# Patient Record
Sex: Male | Born: 1945 | Race: White | Hispanic: No | Marital: Married | Smoking: Former smoker
Health system: Southern US, Community
[De-identification: ages and names within clinical notes are randomized; demographics above are authoritative.]

## PROBLEM LIST (undated history)

## (undated) DIAGNOSIS — D126 Benign neoplasm of colon, unspecified: Secondary | ICD-10-CM

## (undated) DIAGNOSIS — G20A1 Parkinson's disease without dyskinesia, without mention of fluctuations: Secondary | ICD-10-CM

## (undated) DIAGNOSIS — I700 Atherosclerosis of aorta: Secondary | ICD-10-CM

## (undated) DIAGNOSIS — J381 Polyp of vocal cord and larynx: Secondary | ICD-10-CM

## (undated) DIAGNOSIS — N390 Urinary tract infection, site not specified: Secondary | ICD-10-CM

## (undated) DIAGNOSIS — T8859XA Other complications of anesthesia, initial encounter: Secondary | ICD-10-CM

## (undated) DIAGNOSIS — W19XXXA Unspecified fall, initial encounter: Secondary | ICD-10-CM

## (undated) DIAGNOSIS — R001 Bradycardia, unspecified: Secondary | ICD-10-CM

## (undated) DIAGNOSIS — K4090 Unilateral inguinal hernia, without obstruction or gangrene, not specified as recurrent: Secondary | ICD-10-CM

## (undated) DIAGNOSIS — Z87442 Personal history of urinary calculi: Secondary | ICD-10-CM

## (undated) DIAGNOSIS — I77812 Thoracoabdominal aortic ectasia: Secondary | ICD-10-CM

## (undated) DIAGNOSIS — R195 Other fecal abnormalities: Secondary | ICD-10-CM

## (undated) DIAGNOSIS — I509 Heart failure, unspecified: Secondary | ICD-10-CM

## (undated) DIAGNOSIS — A419 Sepsis, unspecified organism: Secondary | ICD-10-CM

## (undated) DIAGNOSIS — D649 Anemia, unspecified: Secondary | ICD-10-CM

## (undated) DIAGNOSIS — F0280 Dementia in other diseases classified elsewhere without behavioral disturbance: Secondary | ICD-10-CM

## (undated) DIAGNOSIS — I714 Abdominal aortic aneurysm, without rupture, unspecified: Secondary | ICD-10-CM

## (undated) DIAGNOSIS — K5900 Constipation, unspecified: Secondary | ICD-10-CM

## (undated) DIAGNOSIS — C801 Malignant (primary) neoplasm, unspecified: Secondary | ICD-10-CM

## (undated) DIAGNOSIS — K429 Umbilical hernia without obstruction or gangrene: Secondary | ICD-10-CM

## (undated) DIAGNOSIS — K219 Gastro-esophageal reflux disease without esophagitis: Secondary | ICD-10-CM

## (undated) DIAGNOSIS — I5020 Unspecified systolic (congestive) heart failure: Secondary | ICD-10-CM

## (undated) DIAGNOSIS — N200 Calculus of kidney: Secondary | ICD-10-CM

## (undated) DIAGNOSIS — F1021 Alcohol dependence, in remission: Secondary | ICD-10-CM

## (undated) DIAGNOSIS — Y92009 Unspecified place in unspecified non-institutional (private) residence as the place of occurrence of the external cause: Secondary | ICD-10-CM

## (undated) DIAGNOSIS — G2581 Restless legs syndrome: Secondary | ICD-10-CM

## (undated) DIAGNOSIS — C4442 Squamous cell carcinoma of skin of scalp and neck: Secondary | ICD-10-CM

## (undated) DIAGNOSIS — F32A Depression, unspecified: Secondary | ICD-10-CM

## (undated) DIAGNOSIS — N281 Cyst of kidney, acquired: Secondary | ICD-10-CM

## (undated) DIAGNOSIS — I2510 Atherosclerotic heart disease of native coronary artery without angina pectoris: Secondary | ICD-10-CM

## (undated) DIAGNOSIS — N210 Calculus in bladder: Secondary | ICD-10-CM

## (undated) DIAGNOSIS — G20 Parkinson's disease: Secondary | ICD-10-CM

## (undated) DIAGNOSIS — R32 Unspecified urinary incontinence: Secondary | ICD-10-CM

## (undated) DIAGNOSIS — C439 Malignant melanoma of skin, unspecified: Secondary | ICD-10-CM

## (undated) DIAGNOSIS — G479 Sleep disorder, unspecified: Secondary | ICD-10-CM

## (undated) DIAGNOSIS — N529 Male erectile dysfunction, unspecified: Secondary | ICD-10-CM

## (undated) DIAGNOSIS — K635 Polyp of colon: Secondary | ICD-10-CM

## (undated) DIAGNOSIS — N400 Enlarged prostate without lower urinary tract symptoms: Secondary | ICD-10-CM

## (undated) HISTORY — PX: CHOLECYSTECTOMY: SHX55

## (undated) HISTORY — DX: Calculus in bladder: N21.0

## (undated) HISTORY — DX: Benign prostatic hyperplasia without lower urinary tract symptoms: N40.0

## (undated) HISTORY — PX: OTHER SURGICAL HISTORY: SHX169

## (undated) HISTORY — PX: TONSILLECTOMY: SUR1361

## (undated) HISTORY — PX: LAPAROSCOPIC PARTIAL COLECTOMY: SHX5907

## (undated) HISTORY — PX: COLON SURGERY: SHX602

## (undated) HISTORY — PX: MELANOMA EXCISION: SHX5266

## (undated) HISTORY — DX: Umbilical hernia without obstruction or gangrene: K42.9

## (undated) HISTORY — DX: Unilateral inguinal hernia, without obstruction or gangrene, not specified as recurrent: K40.90

## (undated) HISTORY — DX: Atherosclerosis of aorta: I70.0

## (undated) HISTORY — DX: Squamous cell carcinoma of skin of scalp and neck: C44.42

## (undated) HISTORY — DX: Parkinson's disease without dyskinesia, without mention of fluctuations: G20.A1

## (undated) HISTORY — DX: Anemia, unspecified: D64.9

## (undated) HISTORY — DX: Depression, unspecified: F32.A

## (undated) HISTORY — PX: CYST EXCISION: SHX5701

## (undated) HISTORY — DX: Dementia in other diseases classified elsewhere, unspecified severity, without behavioral disturbance, psychotic disturbance, mood disturbance, and anxiety: F02.80

## (undated) HISTORY — DX: Sleep disorder, unspecified: G47.9

## (undated) HISTORY — DX: Atherosclerotic heart disease of native coronary artery without angina pectoris: I25.10

## (undated) HISTORY — DX: Thoracoabdominal aortic ectasia: I77.812

## (undated) HISTORY — DX: Calculus of kidney: N20.0

## (undated) HISTORY — DX: Heart failure, unspecified: I50.9

## (undated) HISTORY — DX: Bradycardia, unspecified: R00.1

---

## 2003-08-16 ENCOUNTER — Emergency Department (HOSPITAL_COMMUNITY): Admission: AD | Admit: 2003-08-16 | Discharge: 2003-08-16 | Payer: Self-pay | Admitting: Family Medicine

## 2011-09-28 DIAGNOSIS — G20 Parkinson's disease: Secondary | ICD-10-CM | POA: Diagnosis present

## 2012-05-15 DIAGNOSIS — Z85820 Personal history of malignant melanoma of skin: Secondary | ICD-10-CM

## 2013-02-22 DIAGNOSIS — R4189 Other symptoms and signs involving cognitive functions and awareness: Secondary | ICD-10-CM | POA: Diagnosis present

## 2013-07-31 DIAGNOSIS — F1021 Alcohol dependence, in remission: Secondary | ICD-10-CM | POA: Insufficient documentation

## 2013-10-24 DIAGNOSIS — Z9689 Presence of other specified functional implants: Secondary | ICD-10-CM

## 2013-10-24 HISTORY — DX: Presence of other specified functional implants: Z96.89

## 2013-10-24 HISTORY — PX: DEEP BRAIN STIMULATOR PLACEMENT: SHX608

## 2016-05-18 ENCOUNTER — Other Ambulatory Visit: Payer: Self-pay | Admitting: Internal Medicine

## 2016-05-18 DIAGNOSIS — Z136 Encounter for screening for cardiovascular disorders: Secondary | ICD-10-CM

## 2016-05-26 ENCOUNTER — Other Ambulatory Visit: Payer: Self-pay | Admitting: Internal Medicine

## 2016-05-27 DIAGNOSIS — R195 Other fecal abnormalities: Secondary | ICD-10-CM | POA: Diagnosis present

## 2016-05-30 ENCOUNTER — Other Ambulatory Visit: Payer: Self-pay | Admitting: Internal Medicine

## 2016-05-30 DIAGNOSIS — G20A1 Parkinson's disease without dyskinesia, without mention of fluctuations: Secondary | ICD-10-CM

## 2016-05-30 DIAGNOSIS — R1312 Dysphagia, oropharyngeal phase: Secondary | ICD-10-CM

## 2016-05-30 DIAGNOSIS — G20 Parkinson's disease: Secondary | ICD-10-CM

## 2016-05-31 ENCOUNTER — Ambulatory Visit
Admission: RE | Admit: 2016-05-31 | Discharge: 2016-05-31 | Disposition: A | Discharge status: Home / Self Care | Payer: Medicare Other | Source: Ambulatory Visit | Attending: Internal Medicine | Admitting: Internal Medicine

## 2016-05-31 DIAGNOSIS — N281 Cyst of kidney, acquired: Secondary | ICD-10-CM | POA: Insufficient documentation

## 2016-05-31 DIAGNOSIS — I77811 Abdominal aortic ectasia: Secondary | ICD-10-CM | POA: Insufficient documentation

## 2016-05-31 DIAGNOSIS — Z136 Encounter for screening for cardiovascular disorders: Secondary | ICD-10-CM | POA: Diagnosis not present

## 2016-06-16 ENCOUNTER — Ambulatory Visit: Payer: Medicare Other | Attending: Internal Medicine

## 2016-07-14 ENCOUNTER — Other Ambulatory Visit: Payer: Self-pay | Admitting: Gastroenterology

## 2016-07-14 DIAGNOSIS — R1314 Dysphagia, pharyngoesophageal phase: Secondary | ICD-10-CM

## 2016-07-19 ENCOUNTER — Ambulatory Visit: Admission: RE | Admit: 2016-07-19 | Payer: Medicare Other | Source: Ambulatory Visit

## 2016-09-27 ENCOUNTER — Ambulatory Visit
Admission: RE | Admit: 2016-09-27 | Discharge: 2016-09-27 | Disposition: A | Discharge status: Home / Self Care | Payer: Medicare HMO | Source: Ambulatory Visit | Attending: Physician Assistant | Admitting: Physician Assistant

## 2016-09-27 ENCOUNTER — Other Ambulatory Visit: Payer: Self-pay | Admitting: Physician Assistant

## 2016-09-27 DIAGNOSIS — M7989 Other specified soft tissue disorders: Secondary | ICD-10-CM

## 2016-11-07 ENCOUNTER — Ambulatory Visit: Payer: Medicare HMO | Admitting: Certified Registered Nurse Anesthetist

## 2016-11-07 ENCOUNTER — Ambulatory Visit
Admission: RE | Admit: 2016-11-07 | Discharge: 2016-11-07 | Disposition: A | Discharge status: Home / Self Care | Payer: Medicare HMO | Source: Ambulatory Visit | Attending: Gastroenterology | Admitting: Gastroenterology

## 2016-11-07 ENCOUNTER — Encounter
Admission: RE | Disposition: A | Discharge status: Home / Self Care | Payer: Self-pay | Source: Ambulatory Visit | Attending: Gastroenterology

## 2016-11-07 ENCOUNTER — Encounter: Payer: Self-pay | Admitting: *Deleted

## 2016-11-07 DIAGNOSIS — R1310 Dysphagia, unspecified: Secondary | ICD-10-CM | POA: Diagnosis not present

## 2016-11-07 DIAGNOSIS — Z85828 Personal history of other malignant neoplasm of skin: Secondary | ICD-10-CM | POA: Diagnosis not present

## 2016-11-07 DIAGNOSIS — Z79899 Other long term (current) drug therapy: Secondary | ICD-10-CM | POA: Insufficient documentation

## 2016-11-07 DIAGNOSIS — G20 Parkinson's disease: Secondary | ICD-10-CM | POA: Insufficient documentation

## 2016-11-07 DIAGNOSIS — D124 Benign neoplasm of descending colon: Secondary | ICD-10-CM | POA: Insufficient documentation

## 2016-11-07 DIAGNOSIS — D120 Benign neoplasm of cecum: Secondary | ICD-10-CM | POA: Diagnosis not present

## 2016-11-07 DIAGNOSIS — D123 Benign neoplasm of transverse colon: Secondary | ICD-10-CM | POA: Insufficient documentation

## 2016-11-07 DIAGNOSIS — K5730 Diverticulosis of large intestine without perforation or abscess without bleeding: Secondary | ICD-10-CM | POA: Diagnosis not present

## 2016-11-07 DIAGNOSIS — K921 Melena: Secondary | ICD-10-CM | POA: Diagnosis present

## 2016-11-07 DIAGNOSIS — D125 Benign neoplasm of sigmoid colon: Secondary | ICD-10-CM | POA: Insufficient documentation

## 2016-11-07 HISTORY — PX: COLONOSCOPY WITH PROPOFOL: SHX5780

## 2016-11-07 HISTORY — DX: Malignant (primary) neoplasm, unspecified: C80.1

## 2016-11-07 HISTORY — DX: Parkinson's disease without dyskinesia, without mention of fluctuations: G20.A1

## 2016-11-07 HISTORY — DX: Parkinson's disease: G20

## 2016-11-07 SURGERY — COLONOSCOPY WITH PROPOFOL
Anesthesia: General

## 2016-11-07 MED ORDER — SODIUM CHLORIDE 0.9 % IV SOLN: INTRAVENOUS | Status: DC

## 2016-11-07 MED ORDER — PROPOFOL 10 MG/ML IV BOLUS
INTRAVENOUS | Status: AC
  Filled 2016-11-07: qty 20

## 2016-11-07 MED ORDER — LIDOCAINE HCL (PF) 2 % IJ SOLN
INTRAMUSCULAR | Status: AC
  Filled 2016-11-07: qty 2

## 2016-11-07 MED ORDER — PROPOFOL 500 MG/50ML IV EMUL
INTRAVENOUS | Status: DC | PRN
  Administered 2016-11-07: 140 ug/kg/min via INTRAVENOUS

## 2016-11-07 MED ORDER — LIDOCAINE HCL (CARDIAC) 20 MG/ML IV SOLN
INTRAVENOUS | Status: DC | PRN
  Administered 2016-11-07: 80 mg via INTRAVENOUS

## 2016-11-07 MED ORDER — PROPOFOL 500 MG/50ML IV EMUL
INTRAVENOUS | Status: AC
  Filled 2016-11-07: qty 50

## 2016-11-07 MED ORDER — SODIUM CHLORIDE 0.9 % IV SOLN
INTRAVENOUS | Status: DC
  Administered 2016-11-07: 1000 mL via INTRAVENOUS

## 2016-11-07 MED ORDER — PROPOFOL 10 MG/ML IV BOLUS
INTRAVENOUS | Status: DC | PRN
  Administered 2016-11-07: 70 mg via INTRAVENOUS

## 2016-11-07 NOTE — Anesthesia Postprocedure Evaluation (Signed)
Anesthesia Post Note  Patient: Wesley Anderson.  Procedure(s) Performed: Procedure(s) (LRB): COLONOSCOPY WITH PROPOFOL (N/A)  Patient location during evaluation: Endoscopy Anesthesia Type: General Level of consciousness: awake and alert Pain management: pain level controlled Vital Signs Assessment: post-procedure vital signs reviewed and stable Respiratory status: spontaneous breathing, nonlabored ventilation, respiratory function stable and patient connected to nasal cannula oxygen Cardiovascular status: blood pressure returned to baseline and stable Postop Assessment: no signs of nausea or vomiting Anesthetic complications: no     Last Vitals:  Vitals:   11/07/16 1008 11/07/16 1018  BP: (!) 148/96 136/84  Pulse: (!) 54 (!) 45  Resp: 13 10  Temp:      Last Pain:  Vitals:   11/07/16 0938  TempSrc: Tympanic                 Martha Clan

## 2016-11-07 NOTE — Anesthesia Procedure Notes (Signed)
Performed by: Willie Plain Pre-anesthesia Checklist: Patient identified, Emergency Drugs available, Suction available, Patient being monitored and Timeout performed Patient Re-evaluated:Patient Re-evaluated prior to inductionOxygen Delivery Method: Nasal cannula Intubation Type: IV induction       

## 2016-11-07 NOTE — Anesthesia Preprocedure Evaluation (Signed)
Anesthesia Evaluation  Patient identified by MRN, date of birth, ID band Patient awake    Reviewed: Allergy & Precautions, H&P , NPO status , Patient's Chart, lab work & pertinent test results, reviewed documented beta blocker date and time   History of Anesthesia Complications Negative for: history of anesthetic complications  Airway Mallampati: I  TM Distance: >3 FB Neck ROM: full    Dental  (+) Caps, Missing   Pulmonary neg pulmonary ROS,           Cardiovascular Exercise Tolerance: Good negative cardio ROS       Neuro/Psych neg Seizures Parkinson's disease negative psych ROS   GI/Hepatic negative GI ROS, Neg liver ROS,   Endo/Other  negative endocrine ROS  Renal/GU negative Renal ROS  negative genitourinary   Musculoskeletal   Abdominal   Peds  Hematology negative hematology ROS (+)   Anesthesia Other Findings Past Medical History: No date: Cancer ([place])     Comment: skin  No date: Parkinson's disease ([place])   Reproductive/Obstetrics negative OB ROS                             Anesthesia Physical Anesthesia Plan  ASA: II  Anesthesia Plan: General   Post-op Pain Management:    Induction:   Airway Management Planned:   Additional Equipment:   Intra-op Plan:   Post-operative Plan:   Informed Consent: I have reviewed the patients History and Physical, chart, labs and discussed the procedure including the risks, benefits and alternatives for the proposed anesthesia with the patient or authorized representative who has indicated his/her understanding and acceptance.   Dental Advisory Given  Plan Discussed with: Anesthesiologist, CRNA and Surgeon  Anesthesia Plan Comments:         Anesthesia Quick Evaluation

## 2016-11-07 NOTE — Anesthesia Post-op Follow-up Note (Cosign Needed)
Anesthesia QCDR form completed.        

## 2016-11-07 NOTE — Op Note (Signed)
[facility] Gastroenterology Patient Name: Wesley Anderson Procedure Date: 11/07/2016 8:32 AM MRN: [id number] Account #: [id number] Date of Birth: 23-Oct-1945 Admit Type: Outpatient Age: 71 Room: [facility] ENDO ROOM 3 Gender: Male Note Status: Finalized Procedure:            Colonoscopy Indications:          Heme positive stool Providers:            Lollie Sails, MD Referring MD:         Ramonita Lab, MD (Referring MD) Medicines:            Monitored Anesthesia Care Complications:        No immediate complications. Procedure:            Pre-Anesthesia Assessment:                       - ASA Grade Assessment: III - A patient with severe                        systemic disease.                       After obtaining informed consent, the colonoscope was                        passed under direct vision. Throughout the procedure,                        the patient's blood pressure, pulse, and oxygen                        saturations were monitored continuously. The                        Colonoscope was introduced through the anus and                        advanced to the the cecum, identified by appendiceal                        orifice and ileocecal valve. The quality of the bowel                        preparation was good. Findings:      Two flat polyps were found in the descending colon. The polyps were 4 to       6 mm in size. These polyps were removed with a cold snare. Resection and       retrieval were complete.      Two flat polyps were found in the distal transverse colon. The polyps       were 4 to 6 mm in size.      A 8 mm polyp was found in the hepatic flexure. The polyp was flat. The       polyp was removed with a cold snare. Resection and retrieval were       complete.      Three sessile polyps were found in the cecum. The polyps were 1 to 3 mm       in size. These polyps were removed with a cold biopsy forceps. Resection       and retrieval were  complete.  A greater than 50 mm polyp was found in the cecum, across from the IC       valve. The polyp was carpet-like. Biopsies were taken with a cold       forceps for histology. There is also a polyp adjacent to this, about 5       mm, not removed.      Two pedunculated polyps were found in the proximal sigmoid colon. The       polyps were 5 to 10 mm in size. These polyps were removed with a cold       snare. Resection and retrieval were complete. To prevent bleeding after       the polypectomy, two hemostatic clips were successfully placed. There       was no bleeding at the end of the maneuver.      Multiple small and large-mouthed diverticula were found in the sigmoid       colon and descending colon.      The retroflexed view of the distal rectum and anal verge was normal and       showed no anal or rectal abnormalities.      The digital rectal exam was normal. Impression:           - Two 4 to 6 mm polyps in the descending colon, removed                        with a cold snare. Resected and retrieved.                       - Two 4 to 6 mm polyps in the distal transverse colon.                       - One 8 mm polyp at the hepatic flexure, removed with a                        cold snare. Resected and retrieved.                       - Three 1 to 3 mm polyps in the cecum, removed with a                        cold biopsy forceps. Resected and retrieved.                       - One greater than 50 mm polyp in the cecum. Biopsied.                       - Two 5 to 10 mm polyps in the proximal sigmoid colon,                        removed with a cold snare. Resected and retrieved.                        Clips were placed.                       - Diverticulosis in the sigmoid colon and in the  descending colon.                       - The distal rectum and anal verge are normal on                        retroflexion view. Recommendation:       - Discharge  patient to home.                       - Await pathology results.                       - If the pathology report reveals adenomatous tissue,                        then refer to a surgeon at appointment to be scheduled. Procedure Code(s):    --- Professional ---                       [phone], Colonoscopy, flexible; with removal of tumor(s),                        polyp(s), or other lesion(s) by snare technique                       [id number], 48, Colonoscopy, flexible; with biopsy, single                        or multiple Diagnosis Code(s):    --- Professional ---                       D12.4, Benign neoplasm of descending colon                       D12.3, Benign neoplasm of transverse colon (hepatic                        flexure or splenic flexure)                       D12.0, Benign neoplasm of cecum                       D12.5, Benign neoplasm of sigmoid colon                       R19.5, Other fecal abnormalities                       K57.30, Diverticulosis of large intestine without                        perforation or abscess without bleeding CPT copyright 2016 American Medical Association. All rights reserved. The codes documented in this report are preliminary and upon coder review may  be revised to meet current compliance requirements. Lollie Sails, MD 11/07/2016 9:40:42 AM This report has been signed electronically. Number of Addenda: 0 Note Initiated On: 11/07/2016 8:32 AM Scope Withdrawal Time: 0 hours 32 minutes 7 seconds  Total Procedure Duration: 0 hours 56 minutes 0 seconds       [facility]

## 2016-11-07 NOTE — H&P (Signed)
Outpatient short stay form Pre-procedure 11/07/2016 8:25 AM Lollie Sails MD  Primary Physician: Dr. Ramonita Lab  Reason for visit:  Colonoscopy  History of present illness:  Patient is a 71 year old male presenting today as above. He has a personal history of a finding of a Hemoccult-positive stool. He takes no blood thinning agents or aspirin products. Tolerated his prep well.    Current Facility-Administered Medications:  .  0.9 %  sodium chloride infusion, , Intravenous, Continuous, Lollie Sails, MD, Last Rate: 20 mL/hr at 11/07/16 0823, 1,000 mL at 11/07/16 0823 .  0.9 %  sodium chloride infusion, , Intravenous, Continuous, Lollie Sails, MD  Prescriptions Prior to Admission  Medication Sig Dispense Refill Last Dose  . amantadine (SYMMETREL) 100 MG capsule Take 100 mg by mouth 2 (two) times daily.     . Carbidopa-Levodopa ER (SINEMET CR) 25-100 MG tablet controlled release Take 1 tablet by mouth 2 (two) times daily.     . Multiple Vitamin (MULTIVITAMIN) tablet Take 1 tablet by mouth daily.     Marland Kitchen rOPINIRole (REQUIP XL) 8 MG 24 hr tablet Take 8 mg by mouth at bedtime.     . sildenafil (VIAGRA) 100 MG tablet Take 100 mg by mouth daily as needed for erectile dysfunction.     . urea (CARMOL) 40 % CREA Apply topically daily.        Allergies  Allergen Reactions  . Artane [Trihexyphenidyl] Anxiety     Past Medical History:  Diagnosis Date  . Cancer ([place])    skin   . Parkinson's disease ([place])     Review of systems:      Physical Exam    Heart and lungs: Regular rate and rhythm without rub or gallop, lungs are bilaterally clear.    HEENT: Normocephalic atraumatic eyes are anicteric    Other:     Pertinant exam for procedure: Soft nontender nondistended bowel sounds positive normoactive.    Planned proceedures: Colonoscopy and indicated procedures. I have discussed the risks benefits and complications of procedures to include not limited to bleeding,  infection, perforation and the risk of sedation and the patient wishes to proceed.    Lollie Sails, MD Gastroenterology 11/07/2016  8:25 AM

## 2016-11-07 NOTE — Transfer of Care (Signed)
Immediate Anesthesia Transfer of Care Note  Patient: Wesley Anderson.  Procedure(s) Performed: Procedure(s): COLONOSCOPY WITH PROPOFOL (N/A)  Patient Location: PACU  Anesthesia Type:General  Level of Consciousness: sedated  Airway & Oxygen Therapy: Patient Spontanous Breathing and Patient connected to nasal cannula oxygen  Post-op Assessment: Report given to RN and Post -op Vital signs reviewed and stable  Post vital signs: Reviewed and stable  Last Vitals:  Vitals:   11/07/16 0805  BP: (!) 153/90  Pulse: (!) 59  Resp: 16  Temp: 36.2 C    Last Pain:  Vitals:   11/07/16 0805  TempSrc: Tympanic         Complications: No apparent anesthesia complications

## 2016-11-08 ENCOUNTER — Encounter: Payer: Self-pay | Admitting: Gastroenterology

## 2016-11-08 LAB — SURGICAL PATHOLOGY

## 2016-11-16 ENCOUNTER — Encounter: Payer: Self-pay | Admitting: *Deleted

## 2016-11-17 ENCOUNTER — Ambulatory Visit: Payer: Self-pay | Admitting: General Surgery

## 2016-11-23 ENCOUNTER — Encounter: Payer: Self-pay | Admitting: *Deleted

## 2016-11-29 ENCOUNTER — Ambulatory Visit: Payer: Self-pay | Admitting: General Surgery

## 2018-05-08 ENCOUNTER — Other Ambulatory Visit: Payer: Self-pay | Admitting: Gastroenterology

## 2018-05-08 DIAGNOSIS — R1310 Dysphagia, unspecified: Secondary | ICD-10-CM

## 2018-05-17 ENCOUNTER — Ambulatory Visit: Payer: Self-pay

## 2018-05-23 ENCOUNTER — Ambulatory Visit
Admission: RE | Admit: 2018-05-23 | Discharge: 2018-05-23 | Disposition: A | Discharge status: Home / Self Care | Payer: Medicare HMO | Source: Ambulatory Visit | Attending: Gastroenterology | Admitting: Gastroenterology

## 2018-05-23 DIAGNOSIS — K228 Other specified diseases of esophagus: Secondary | ICD-10-CM | POA: Insufficient documentation

## 2018-05-23 DIAGNOSIS — K219 Gastro-esophageal reflux disease without esophagitis: Secondary | ICD-10-CM | POA: Insufficient documentation

## 2018-05-23 DIAGNOSIS — R1310 Dysphagia, unspecified: Secondary | ICD-10-CM | POA: Insufficient documentation

## 2018-07-05 ENCOUNTER — Encounter: Admission: RE | Payer: Self-pay | Source: Ambulatory Visit

## 2018-07-05 ENCOUNTER — Ambulatory Visit: Admission: RE | Admit: 2018-07-05 | Payer: Medicare HMO | Source: Ambulatory Visit | Admitting: Gastroenterology

## 2018-07-05 SURGERY — EGD (ESOPHAGOGASTRODUODENOSCOPY)
Anesthesia: General

## 2018-09-05 ENCOUNTER — Encounter: Payer: Self-pay | Admitting: *Deleted

## 2018-09-06 ENCOUNTER — Encounter
Admission: RE | Disposition: A | Discharge status: Home / Self Care | Payer: Self-pay | Source: Home / Self Care | Attending: Gastroenterology

## 2018-09-06 ENCOUNTER — Other Ambulatory Visit: Payer: Self-pay

## 2018-09-06 ENCOUNTER — Ambulatory Visit
Admission: RE | Admit: 2018-09-06 | Discharge: 2018-09-06 | Disposition: A | Discharge status: Home / Self Care | Payer: Medicare HMO | Attending: Gastroenterology | Admitting: Gastroenterology

## 2018-09-06 ENCOUNTER — Encounter: Payer: Self-pay | Admitting: *Deleted

## 2018-09-06 ENCOUNTER — Ambulatory Visit: Payer: Medicare HMO | Admitting: Anesthesiology

## 2018-09-06 DIAGNOSIS — K224 Dyskinesia of esophagus: Secondary | ICD-10-CM | POA: Insufficient documentation

## 2018-09-06 DIAGNOSIS — K2980 Duodenitis without bleeding: Secondary | ICD-10-CM | POA: Diagnosis not present

## 2018-09-06 DIAGNOSIS — Z85820 Personal history of malignant melanoma of skin: Secondary | ICD-10-CM | POA: Insufficient documentation

## 2018-09-06 DIAGNOSIS — I714 Abdominal aortic aneurysm, without rupture: Secondary | ICD-10-CM | POA: Diagnosis not present

## 2018-09-06 DIAGNOSIS — N529 Male erectile dysfunction, unspecified: Secondary | ICD-10-CM | POA: Diagnosis not present

## 2018-09-06 DIAGNOSIS — Z86010 Personal history of colonic polyps: Secondary | ICD-10-CM | POA: Diagnosis not present

## 2018-09-06 DIAGNOSIS — K3189 Other diseases of stomach and duodenum: Secondary | ICD-10-CM | POA: Diagnosis not present

## 2018-09-06 DIAGNOSIS — G2581 Restless legs syndrome: Secondary | ICD-10-CM | POA: Diagnosis not present

## 2018-09-06 DIAGNOSIS — K210 Gastro-esophageal reflux disease with esophagitis: Secondary | ICD-10-CM | POA: Diagnosis not present

## 2018-09-06 DIAGNOSIS — Z87891 Personal history of nicotine dependence: Secondary | ICD-10-CM | POA: Diagnosis not present

## 2018-09-06 DIAGNOSIS — K2960 Other gastritis without bleeding: Secondary | ICD-10-CM | POA: Diagnosis not present

## 2018-09-06 DIAGNOSIS — K2950 Unspecified chronic gastritis without bleeding: Secondary | ICD-10-CM | POA: Insufficient documentation

## 2018-09-06 DIAGNOSIS — Z9049 Acquired absence of other specified parts of digestive tract: Secondary | ICD-10-CM | POA: Insufficient documentation

## 2018-09-06 DIAGNOSIS — Z79899 Other long term (current) drug therapy: Secondary | ICD-10-CM | POA: Diagnosis not present

## 2018-09-06 DIAGNOSIS — Z09 Encounter for follow-up examination after completed treatment for conditions other than malignant neoplasm: Secondary | ICD-10-CM | POA: Insufficient documentation

## 2018-09-06 DIAGNOSIS — G20 Parkinson's disease: Secondary | ICD-10-CM | POA: Insufficient documentation

## 2018-09-06 HISTORY — DX: Abdominal aortic aneurysm, without rupture, unspecified: I71.40

## 2018-09-06 HISTORY — DX: Other fecal abnormalities: R19.5

## 2018-09-06 HISTORY — DX: Cyst of kidney, acquired: N28.1

## 2018-09-06 HISTORY — PX: COLONOSCOPY WITH PROPOFOL: SHX5780

## 2018-09-06 HISTORY — DX: Polyp of colon: K63.5

## 2018-09-06 HISTORY — DX: Restless legs syndrome: G25.81

## 2018-09-06 HISTORY — DX: Male erectile dysfunction, unspecified: N52.9

## 2018-09-06 HISTORY — DX: Alcohol dependence, in remission: F10.21

## 2018-09-06 HISTORY — DX: Abdominal aortic aneurysm, without rupture: I71.4

## 2018-09-06 HISTORY — PX: ESOPHAGOGASTRODUODENOSCOPY (EGD) WITH PROPOFOL: SHX5813

## 2018-09-06 HISTORY — DX: Malignant melanoma of skin, unspecified: C43.9

## 2018-09-06 SURGERY — COLONOSCOPY WITH PROPOFOL
Anesthesia: General

## 2018-09-06 MED ORDER — GLYCOPYRROLATE 0.2 MG/ML IJ SOLN
INTRAMUSCULAR | Status: AC
  Filled 2018-09-06: qty 1

## 2018-09-06 MED ORDER — PROPOFOL 500 MG/50ML IV EMUL
INTRAVENOUS | Status: AC
  Filled 2018-09-06: qty 50

## 2018-09-06 MED ORDER — PROPOFOL 10 MG/ML IV BOLUS
INTRAVENOUS | Status: DC | PRN
  Administered 2018-09-06: 20 mg via INTRAVENOUS
  Administered 2018-09-06: 80 mg via INTRAVENOUS

## 2018-09-06 MED ORDER — LIDOCAINE HCL (PF) 2 % IJ SOLN
INTRAMUSCULAR | Status: AC
  Filled 2018-09-06: qty 10

## 2018-09-06 MED ORDER — FENTANYL CITRATE (PF) 100 MCG/2ML IJ SOLN
INTRAMUSCULAR | Status: DC | PRN
  Administered 2018-09-06 (×2): 50 ug via INTRAVENOUS

## 2018-09-06 MED ORDER — PROPOFOL 500 MG/50ML IV EMUL
INTRAVENOUS | Status: DC | PRN
  Administered 2018-09-06: 140 ug/kg/min via INTRAVENOUS

## 2018-09-06 MED ORDER — SODIUM CHLORIDE 0.9 % IV SOLN
INTRAVENOUS | Status: DC | PRN
  Administered 2018-09-06: 09:00:00 via INTRAVENOUS

## 2018-09-06 MED ORDER — LIDOCAINE 2% (20 MG/ML) 5 ML SYRINGE
INTRAMUSCULAR | Status: DC | PRN
  Administered 2018-09-06: 30 mg via INTRAVENOUS

## 2018-09-06 MED ORDER — FENTANYL CITRATE (PF) 100 MCG/2ML IJ SOLN
INTRAMUSCULAR | Status: AC
  Filled 2018-09-06: qty 2

## 2018-09-06 MED ORDER — PROPOFOL 10 MG/ML IV BOLUS
INTRAVENOUS | Status: AC
  Filled 2018-09-06: qty 20

## 2018-09-06 MED ORDER — EPHEDRINE SULFATE 50 MG/ML IJ SOLN
INTRAMUSCULAR | Status: DC | PRN
  Administered 2018-09-06: 10 mg via INTRAVENOUS

## 2018-09-06 MED ORDER — SODIUM CHLORIDE 0.9 % IV SOLN
INTRAVENOUS | Status: DC
  Administered 2018-09-06: 08:00:00 via INTRAVENOUS

## 2018-09-06 NOTE — Op Note (Signed)
[facility] Gastroenterology Patient Name: Wesley Anderson Procedure Date: 09/06/2018 7:37 AM MRN: [id number] Account #: [id number] Date of Birth: Jul 16, 1946 Admit Type: Outpatient Age: 73 Room: [facility] ENDO ROOM 1 Gender: Male Note Status: Finalized Procedure:            Upper GI endoscopy Indications:          Dysphagia, Nausea with vomiting Providers:            Lollie Sails, MD Referring MD:         Ramonita Lab, MD (Referring MD) Medicines:            Monitored Anesthesia Care Complications:        No immediate complications. Procedure:            Pre-Anesthesia Assessment:                       - ASA Grade Assessment: III - A patient with severe                        systemic disease.                       After obtaining informed consent, the endoscope was                        passed under direct vision. Throughout the procedure,                        the patient's blood pressure, pulse, and oxygen                        saturations were monitored continuously. The Endoscope                        was introduced through the mouth, and advanced to the                        third part of duodenum. The patient tolerated the                        procedure well. Findings:      The Z-line was variable. Biopsies were taken with a cold forceps for       histology.      LA Grade B (one or more mucosal breaks greater than 5 mm, not extending       between the tops of two mucosal folds) esophagitis with no bleeding was       found.      Abnormal motility was noted in the mid esophagus and in the distal       esophagus. The cricopharyngeus was normal. There are extra peristaltic       waves in the esophageal body. The distal esophagus/lower esophageal       sphincter is open. Tertiary peristaltic waves are noted.      Patchy moderate inflammation characterized by adherent blood, congestion       (edema), erosions, erythema and shallow ulcerations was found in  the       gastric antrum. Biopsies were taken with a cold forceps for histology.       Biopsies were taken with a cold forceps for Helicobacter pylori testing.  The cardia and gastric fundus were normal on retroflexion otherwise.      Diffuse mild mucosal variance characterized by smoothness was found in       the entire duodenum. Biopsies were taken with a cold forceps for       histology.      A single 10 mm mucosal papule (nodule) with no bleeding and no stigmata       of recent bleeding was found in the prepyloric region of the stomach.       Biopsies were taken with a cold forceps for histology.      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 Biopsied.                       - LA Grade B esophagitis.                       - Abnormal esophageal motility.                       - Erosive gastritis. Biopsied.                       - Mucosal variant in the duodenum. Biopsied.                       - A single mucosal papule (nodule) found in the                        stomach. Biopsied. Recommendation:       - Use Protonix (pantoprazole) 40 mg PO daily daily.                       - Return to GI clinic in 4 weeks. Procedure Code(s):    --- Professional ---                       [phone], Esophagogastroduodenoscopy, flexible, transoral;                        with biopsy, single or multiple Diagnosis Code(s):    --- Professional ---                       K22.8, Other specified diseases of esophagus                       K20.9, Esophagitis, unspecified                       K22.4, Dyskinesia of esophagus                       K29.60, Other gastritis without bleeding                       K31.89, Other diseases of stomach and duodenum                       R13.10, Dysphagia, unspecified                       R11.2, Nausea with vomiting,  unspecified CPT copyright 2018 American Medical Association. All rights  reserved. The codes documented in this report are preliminary and upon coder review may  be revised to meet current compliance requirements. Lollie Sails, MD 09/06/2018 9:01:24 AM This report has been signed electronically. Number of Addenda: 0 Note Initiated On: 09/06/2018 7:37 AM      [facility]

## 2018-09-06 NOTE — Anesthesia Preprocedure Evaluation (Addendum)
Anesthesia Evaluation  Patient identified by MRN, date of birth, ID band Patient awake    Reviewed: Allergy & Precautions, H&P , NPO status , Patient's Chart, lab work & pertinent test results  Airway Mallampati: II      Comment: beard Dental  (+) Teeth Intact   Pulmonary neg pulmonary ROS, neg shortness of breath, neg COPD, neg recent URI, former smoker,           Cardiovascular (-) angina(-) Past MI negative cardio ROS       Neuro/Psych PSYCHIATRIC DISORDERS negative neurological ROS     GI/Hepatic negative GI ROS, Neg liver ROS,   Endo/Other  negative endocrine ROS  Renal/GU Renal disease (renal cyst)  negative genitourinary   Musculoskeletal   Abdominal   Peds  Hematology negative hematology ROS (+)   Anesthesia Other Findings Past Medical History: No date: AAA (abdominal aortic aneurysm) ([facility]) No date: Alcohol dependence in remission ([place]) No date: Cancer ([place])     Comment:  skin  No date: Colon polyps No date: ED (erectile dysfunction) No date: Hematest positive stools No date: Melanoma of skin ([place]) No date: Parkinson's disease ([place]) No date: Renal cyst, right No date: Restless leg syndrome  Past Surgical History: No date: CHOLECYSTECTOMY No date: COLON SURGERY     Comment:  colectomy with anastamosis 11/07/2016: COLONOSCOPY WITH PROPOFOL; N/A     Comment:  Procedure: COLONOSCOPY WITH PROPOFOL;  Surgeon: Lollie Sails, MD;  Location: [facility] ENDOSCOPY;  Service:               Endoscopy;  Laterality: N/A; No date: CYST EXCISION     Comment:  vocal cord No date: DBS placement No date: deep brain implant No date: MELANOMA EXCISION No date: TONSILLECTOMY     Reproductive/Obstetrics negative OB ROS                            Anesthesia Physical Anesthesia Plan  ASA: II  Anesthesia Plan: General   Post-op Pain Management:    Induction:   PONV Risk  Score and Plan: Propofol infusion and TIVA  Airway Management Planned:   Additional Equipment:   Intra-op Plan:   Post-operative Plan:   Informed Consent: I have reviewed the patients History and Physical, chart, labs and discussed the procedure including the risks, benefits and alternatives for the proposed anesthesia with the patient or authorized representative who has indicated his/her understanding and acceptance.     Dental Advisory Given  Plan Discussed with: Anesthesiologist and CRNA  Anesthesia Plan Comments:         Anesthesia Quick Evaluation

## 2018-09-06 NOTE — Op Note (Signed)
[facility] Gastroenterology Patient Name: Wesley Anderson Procedure Date: 09/06/2018 7:36 AM MRN: [id number] Account #: [id number] Date of Birth: 11-Jan-1946 Admit Type: Outpatient Age: 73 Room: [facility] ENDO ROOM 1 Gender: Male Note Status: Finalized Procedure:            Colonoscopy Indications:          Personal history of colonic polyps Providers:            Lollie Sails, MD Medicines:            Monitored Anesthesia Care Complications:        No immediate complications. Procedure:            Pre-Anesthesia Assessment:                       - ASA Grade Assessment: III - A patient with severe                        systemic disease.                       After obtaining informed consent, the colonoscope was                        passed under direct vision. Throughout the procedure,                        the patient's blood pressure, pulse, and oxygen                        saturations were monitored continuously. The                        Colonoscope was introduced through the anus with the                        intention of advancing to the surgical stoma. The scope                        was advanced to the sigmoid colon before the procedure                        was aborted. Medications were given. The colonoscopy                        was extremely difficult due to poor bowel prep with                        stool present. The patient tolerated the procedure                        well. The quality of the bowel preparation was                        inadequate. Findings:      A large amount of semi-liquid stool was found in the rectum and in the       sigmoid colon, precluding visualization.      The digital rectal exam was normal. Impression:           - Stool in the rectum and in the sigmoid colon.                       -  No specimens collected. Recommendation:       - reprep and reschedule.                       - Discharge patient to  home. Procedure Code(s):    --- Professional ---                       [phone], 47, Colonoscopy, flexible; diagnostic, including                        collection of specimen(s) by brushing or washing, when                        performed (separate procedure) Diagnosis Code(s):    --- Professional ---                       Z86.010, Personal history of colonic polyps CPT copyright 2018 American Medical Association. All rights reserved. The codes documented in this report are preliminary and upon coder review may  be revised to meet current compliance requirements. Lollie Sails, MD 09/06/2018 9:11:48 AM This report has been signed electronically. Number of Addenda: 0 Note Initiated On: 09/06/2018 7:36 AM Total Procedure Duration: 0 hours 3 minutes 24 seconds       [facility]

## 2018-09-06 NOTE — Anesthesia Post-op Follow-up Note (Signed)
Anesthesia QCDR form completed.        

## 2018-09-06 NOTE — Transfer of Care (Signed)
Immediate Anesthesia Transfer of Care Note  Patient: Wesley Anderson.  Procedure(s) Performed: COLONOSCOPY WITH PROPOFOL (N/A ) ESOPHAGOGASTRODUODENOSCOPY (EGD) WITH PROPOFOL (N/A )  Patient Location: PACU and Endoscopy Unit  Anesthesia Type:General  Level of Consciousness: sedated  Airway & Oxygen Therapy: Patient Spontanous Breathing and Patient connected to nasal cannula oxygen  Post-op Assessment: Report given to RN and Post -op Vital signs reviewed and stable  Post vital signs: Reviewed and stable  Last Vitals:  Vitals Value Taken Time  BP    Temp    Pulse    Resp    SpO2      Last Pain:  Vitals:   09/06/18 0758  TempSrc: Tympanic  PainSc: 0-No pain         Complications: No apparent anesthesia complications

## 2018-09-06 NOTE — H&P (Signed)
Outpatient short stay form Pre-procedure 09/06/2018 8:12 AM Lollie Sails MD  Primary Physician: Dr. Ramonita Lab  Reason for visit: EGD and colonoscopy  History of present illness: Patient is a 73 year old male presenting today for an EGD and colonoscopy.  He has a personal history of multiple adenomatous colon polyps as well as a large colon polyp that was removed by a laparoscopic right hemicolectomy in 2018.  Presenting today for follow-up in regards to this history.  Also he has had no episodes perhaps several times a year of nausea vomiting and diarrhea.  He is felt better from this for a couple of months.  However he does have a history of some dysphagia and had a barium swallow on 05/23/2018.  This showed some mild tertiary contractions intermittently in the distal third of the esophagus probably reflecting mild spasm versus presbyesophagus.  However he also has a moderate gastroesophageal reflux to the level of the cervical esophagus without evidence of esophageal stricture mass or ulceration.  It is of note he has a history of Parkinson's.  Does have an implanted stimulator.  Takes no aspirin or blood thinning agent.   No current facility-administered medications for this encounter.   Medications Prior to Admission  Medication Sig Dispense Refill Last Dose  . amantadine (SYMMETREL) 100 MG capsule Take 100 mg by mouth 2 (two) times daily.   09/05/2018 at 1600  . Carbidopa-Levodopa ER (SINEMET CR) 25-100 MG tablet controlled release Take 1 tablet by mouth 2 (two) times daily.   09/05/2018 at 1600  . Multiple Vitamin (MULTIVITAMIN) tablet Take 1 tablet by mouth daily.   Past Week at Unknown time  . rOPINIRole (REQUIP XL) 8 MG 24 hr tablet Take 8 mg by mouth at bedtime.   09/05/2018 at 1200  . sildenafil (VIAGRA) 100 MG tablet Take 100 mg by mouth daily as needed for erectile dysfunction.   Past Week at Unknown time  . urea (CARMOL) 40 % CREA Apply topically daily.   Past Month at Unknown time      Allergies  Allergen Reactions  . Artane [Trihexyphenidyl] Anxiety     Past Medical History:  Diagnosis Date  . AAA (abdominal aortic aneurysm) ([place])   . Alcohol dependence in remission ([place])   . Cancer ([place])    skin   . Colon polyps   . ED (erectile dysfunction)   . Hematest positive stools   . Melanoma of skin ([place])   . Parkinson's disease ([place])   . Renal cyst, right   . Restless leg syndrome     Review of systems:      Physical Exam    Heart and lungs: Regular rate and rhythm without rub or gallop, lungs are bilaterally clear.    HEENT: Normocephalic atraumatic eyes are anicteric    Other:    Pertinant exam for procedure: Soft nontender nondistended bowel sounds positive normoactive    Planned proceedures: GD, colonoscopy and indicated procedures. I have discussed the risks benefits and complications of procedures to include not limited to bleeding, infection, perforation and the risk of sedation and the patient wishes to proceed.    Lollie Sails, MD Gastroenterology 09/06/2018  8:12 AM

## 2018-09-06 NOTE — Anesthesia Postprocedure Evaluation (Signed)
Anesthesia Post Note  Patient: Daquann Merriott.  Procedure(s) Performed: COLONOSCOPY WITH PROPOFOL (N/A ) ESOPHAGOGASTRODUODENOSCOPY (EGD) WITH PROPOFOL (N/A )  Patient location during evaluation: PACU Anesthesia Type: General Level of consciousness: awake and alert Pain management: pain level controlled Vital Signs Assessment: post-procedure vital signs reviewed and stable Respiratory status: spontaneous breathing, nonlabored ventilation, respiratory function stable and patient connected to nasal cannula oxygen Cardiovascular status: blood pressure returned to baseline and stable Postop Assessment: no apparent nausea or vomiting Anesthetic complications: no     Last Vitals:  Vitals:   09/06/18 0934 09/06/18 0944  BP: (!) 160/82 (!) 120/105  Pulse: (!) 58 (!) 50  Resp: 17 14  Temp:    SpO2: 100% 100%    Last Pain:  Vitals:   09/06/18 0944  TempSrc:   PainSc: 0-No pain                 Durenda Hurt

## 2018-09-07 ENCOUNTER — Encounter: Payer: Self-pay | Admitting: Gastroenterology

## 2018-09-11 LAB — SURGICAL PATHOLOGY

## 2018-11-20 ENCOUNTER — Ambulatory Visit: Admission: RE | Admit: 2018-11-20 | Payer: Medicare HMO | Source: Home / Self Care | Admitting: Gastroenterology

## 2018-11-20 ENCOUNTER — Encounter: Admission: RE | Payer: Self-pay | Source: Home / Self Care

## 2018-11-20 SURGERY — COLONOSCOPY WITH PROPOFOL
Anesthesia: General

## 2019-05-13 ENCOUNTER — Other Ambulatory Visit: Payer: Self-pay

## 2019-05-13 ENCOUNTER — Other Ambulatory Visit
Admission: RE | Admit: 2019-05-13 | Discharge: 2019-05-13 | Disposition: A | Discharge status: Home / Self Care | Payer: Medicare HMO | Source: Ambulatory Visit | Attending: Gastroenterology | Admitting: Gastroenterology

## 2019-05-13 DIAGNOSIS — Z20828 Contact with and (suspected) exposure to other viral communicable diseases: Secondary | ICD-10-CM | POA: Insufficient documentation

## 2019-05-13 DIAGNOSIS — Z01812 Encounter for preprocedural laboratory examination: Secondary | ICD-10-CM | POA: Insufficient documentation

## 2019-05-13 LAB — SARS CORONAVIRUS 2 (TAT 6-24 HRS): SARS Coronavirus 2: NEGATIVE

## 2019-05-15 ENCOUNTER — Encounter: Payer: Self-pay | Admitting: *Deleted

## 2019-05-16 ENCOUNTER — Ambulatory Visit: Payer: Medicare HMO | Admitting: Anesthesiology

## 2019-05-16 ENCOUNTER — Encounter
Admission: RE | Disposition: A | Discharge status: Home / Self Care | Payer: Self-pay | Source: Home / Self Care | Attending: Gastroenterology

## 2019-05-16 ENCOUNTER — Ambulatory Visit
Admission: RE | Admit: 2019-05-16 | Discharge: 2019-05-16 | Disposition: A | Discharge status: Home / Self Care | Payer: Medicare HMO | Attending: Gastroenterology | Admitting: Gastroenterology

## 2019-05-16 ENCOUNTER — Other Ambulatory Visit: Payer: Self-pay

## 2019-05-16 DIAGNOSIS — Z85828 Personal history of other malignant neoplasm of skin: Secondary | ICD-10-CM | POA: Insufficient documentation

## 2019-05-16 DIAGNOSIS — K5730 Diverticulosis of large intestine without perforation or abscess without bleeding: Secondary | ICD-10-CM | POA: Diagnosis not present

## 2019-05-16 DIAGNOSIS — N529 Male erectile dysfunction, unspecified: Secondary | ICD-10-CM | POA: Diagnosis not present

## 2019-05-16 DIAGNOSIS — K219 Gastro-esophageal reflux disease without esophagitis: Secondary | ICD-10-CM | POA: Insufficient documentation

## 2019-05-16 DIAGNOSIS — G20 Parkinson's disease: Secondary | ICD-10-CM | POA: Insufficient documentation

## 2019-05-16 DIAGNOSIS — K640 First degree hemorrhoids: Secondary | ICD-10-CM | POA: Diagnosis not present

## 2019-05-16 DIAGNOSIS — D123 Benign neoplasm of transverse colon: Secondary | ICD-10-CM | POA: Diagnosis not present

## 2019-05-16 DIAGNOSIS — Z79899 Other long term (current) drug therapy: Secondary | ICD-10-CM | POA: Insufficient documentation

## 2019-05-16 DIAGNOSIS — Z9049 Acquired absence of other specified parts of digestive tract: Secondary | ICD-10-CM | POA: Insufficient documentation

## 2019-05-16 DIAGNOSIS — N400 Enlarged prostate without lower urinary tract symptoms: Secondary | ICD-10-CM | POA: Diagnosis not present

## 2019-05-16 DIAGNOSIS — Z86010 Personal history of colonic polyps: Secondary | ICD-10-CM | POA: Diagnosis not present

## 2019-05-16 DIAGNOSIS — D125 Benign neoplasm of sigmoid colon: Secondary | ICD-10-CM | POA: Insufficient documentation

## 2019-05-16 HISTORY — PX: COLONOSCOPY WITH PROPOFOL: SHX5780

## 2019-05-16 HISTORY — DX: Gastro-esophageal reflux disease without esophagitis: K21.9

## 2019-05-16 SURGERY — COLONOSCOPY WITH PROPOFOL
Anesthesia: General

## 2019-05-16 MED ORDER — PROPOFOL 500 MG/50ML IV EMUL
INTRAVENOUS | Status: AC
  Filled 2019-05-16: qty 50

## 2019-05-16 MED ORDER — MIDAZOLAM HCL 2 MG/2ML IJ SOLN
INTRAMUSCULAR | Status: AC
  Filled 2019-05-16: qty 2

## 2019-05-16 MED ORDER — PHENYLEPHRINE HCL (PRESSORS) 10 MG/ML IV SOLN
INTRAVENOUS | Status: DC | PRN
  Administered 2019-05-16 (×2): 100 ug via INTRAVENOUS

## 2019-05-16 MED ORDER — PROPOFOL 10 MG/ML IV BOLUS
INTRAVENOUS | Status: DC | PRN
  Administered 2019-05-16: 60 mg via INTRAVENOUS

## 2019-05-16 MED ORDER — FENTANYL CITRATE (PF) 100 MCG/2ML IJ SOLN
INTRAMUSCULAR | Status: AC
  Filled 2019-05-16: qty 2

## 2019-05-16 MED ORDER — SODIUM CHLORIDE 0.9 % IV SOLN
INTRAVENOUS | Status: DC
  Administered 2019-05-16: 07:00:00 via INTRAVENOUS

## 2019-05-16 MED ORDER — FENTANYL CITRATE (PF) 100 MCG/2ML IJ SOLN
INTRAMUSCULAR | Status: DC | PRN
  Administered 2019-05-16: 25 ug via INTRAVENOUS
  Administered 2019-05-16: 50 ug via INTRAVENOUS
  Administered 2019-05-16: 25 ug via INTRAVENOUS

## 2019-05-16 MED ORDER — PROPOFOL 500 MG/50ML IV EMUL
INTRAVENOUS | Status: DC | PRN
  Administered 2019-05-16: 100 ug/kg/min via INTRAVENOUS

## 2019-05-16 MED ORDER — MIDAZOLAM HCL 2 MG/2ML IJ SOLN
INTRAMUSCULAR | Status: DC | PRN
  Administered 2019-05-16: 1 mg via INTRAVENOUS
  Administered 2019-05-16 (×2): 0.5 mg via INTRAVENOUS

## 2019-05-16 NOTE — Anesthesia Postprocedure Evaluation (Signed)
Anesthesia Post Note  Patient: Wesley Anderson.  Procedure(s) Performed: COLONOSCOPY WITH PROPOFOL (N/A )  Patient location during evaluation: PACU Anesthesia Type: General Level of consciousness: awake and alert Pain management: pain level controlled Vital Signs Assessment: post-procedure vital signs reviewed and stable Respiratory status: spontaneous breathing, nonlabored ventilation and respiratory function stable Cardiovascular status: blood pressure returned to baseline and stable Postop Assessment: no apparent nausea or vomiting Anesthetic complications: no     Last Vitals:  Vitals:   05/16/19 0840 05/16/19 0850  BP: (!) 91/57 107/71  Pulse: 65 65  Resp: 17 18  Temp:    SpO2: 100% 97%    Last Pain:  Vitals:   05/16/19 0820  TempSrc: Tympanic  PainSc:                  Durenda Hurt

## 2019-05-16 NOTE — H&P (Signed)
Outpatient short stay form Pre-procedure 05/16/2019 7:44 AM Lollie Sails MD  Primary Physician: Ramonita Lab, MD  Reason for visit: Colonoscopy  History of present illness: Patient is a 73 year old male presenting today for colonoscopy in regards to his personal history of adenomatous colon polyps.  We had an attempt at this procedure in January 2020 however his prep was poor.  He does have a history of a right hemicolectomy in 2018 due to a nonresectable polyp.  He states he has been doing well with that.  There is no diarrhea rectal bleeding or abdominal pain.  He takes no aspirin or blood thinning agent.    Current Facility-Administered Medications:  .  0.9 %  sodium chloride infusion, , Intravenous, Continuous, Lollie Sails, MD, Last Rate: 20 mL/hr at 05/16/19 0707  Medications Prior to Admission  Medication Sig Dispense Refill Last Dose  . amantadine (SYMMETREL) 100 MG capsule Take 100 mg by mouth 2 (two) times daily.   05/16/2019 at 0545  . Carbidopa-Levodopa ER (SINEMET CR) 25-100 MG tablet controlled release Take 1 tablet by mouth 2 (two) times daily.   05/16/2019 at 0545  . Multiple Vitamin (MULTIVITAMIN) tablet Take 1 tablet by mouth daily.   05/15/2019 at Unknown time  . pantoprazole (PROTONIX) 40 MG tablet Take 40 mg by mouth daily.   Past Week at Unknown time  . rOPINIRole (REQUIP XL) 8 MG 24 hr tablet Take 8 mg by mouth at bedtime.   05/15/2019 at Unknown time  . sildenafil (VIAGRA) 100 MG tablet Take 100 mg by mouth daily as needed for erectile dysfunction.   Past Week at Unknown time  . urea (CARMOL) 40 % CREA Apply topically daily.   Past Week at Unknown time     Allergies  Allergen Reactions  . Artane [Trihexyphenidyl] Anxiety     Past Medical History:  Diagnosis Date  . AAA (abdominal aortic aneurysm) ([place])   . Alcohol dependence in remission ([place])   . Cancer ([place])    skin   . Colon polyps   . ED (erectile dysfunction)   . GERD (gastroesophageal reflux  disease)   . Hematest positive stools   . Melanoma of skin ([place])   . Parkinson's disease ([place])   . Renal cyst, right   . Restless leg syndrome     Review of systems:      Physical Exam    Heart and lungs: Regular rate and rhythm without rub or gallop lungs are bilaterally clear    HEENT: Normocephalic atraumatic eyes are anicteric    Other:    Pertinant exam for procedure: Soft nontender nondistended bowel sounds positive normoactive    Planned proceedures: Colonoscopy and indicated procedures. I have discussed the risks benefits and complications of procedures to include not limited to bleeding, infection, perforation and the risk of sedation and the patient wishes to proceed.    Lollie Sails, MD Gastroenterology 05/16/2019  7:44 AM

## 2019-05-16 NOTE — Anesthesia Post-op Follow-up Note (Signed)
Anesthesia QCDR form completed.        

## 2019-05-16 NOTE — Transfer of Care (Signed)
Immediate Anesthesia Transfer of Care Note  Patient: Wesley Anderson.  Procedure(s) Performed: COLONOSCOPY WITH PROPOFOL (N/A )  Patient Location: PACU  Anesthesia Type:General  Level of Consciousness: sedated and drowsy  Airway & Oxygen Therapy: Patient Spontanous Breathing  Post-op Assessment: Report given to RN, Post -op Vital signs reviewed and stable and Patient moving all extremities X 4  Post vital signs: Reviewed and stable  Last Vitals:  Vitals Value Taken Time  BP 93/61 05/16/19 0823  Temp 36 C 05/16/19 0820  Pulse 47 05/16/19 0823  Resp 13 05/16/19 0823  SpO2 98 % 05/16/19 0823    Last Pain:  Vitals:   05/16/19 0820  TempSrc: Tympanic  PainSc:          Complications: No apparent anesthesia complications

## 2019-05-16 NOTE — Op Note (Signed)
[facility] Gastroenterology Patient Name: Wesley Anderson Procedure Date: 05/16/2019 7:49 AM MRN: [id number] Account #: [id number] Date of Birth: 02-Aug-1946 Admit Type: Outpatient Age: 74 Room: [facility] ENDO ROOM 1 Gender: Male Note Status: Finalized Procedure:            Colonoscopy Indications:          Personal history of colonic polyps Providers:            Lollie Sails, MD Referring MD:         Ramonita Lab, MD (Referring MD) Medicines:            Monitored Anesthesia Care Complications:        No immediate complications. Procedure:            Pre-Anesthesia Assessment:                       - ASA Grade Assessment: III - A patient with severe                        systemic disease.                       After obtaining informed consent, the colonoscope was                        passed under direct vision. Throughout the procedure,                        the patient's blood pressure, pulse, and oxygen                        saturations were monitored continuously. The                        Colonoscope was introduced through the anus and                        advanced to the the cecum, identified by appendiceal                        orifice and ileocecal valve. The colonoscopy was                        performed without difficulty. The patient tolerated the                        procedure well. The quality of the bowel preparation                        was good. Findings:      Multiple medium-mouthed diverticula were found in the sigmoid colon and       descending colon.      A 7 mm polyp was found in the transverse colon. The polyp was sessile.       The polyp was removed with a cold snare. Resection and retrieval were       complete.      A 4 mm polyp was found in the proximal sigmoid colon. The polyp was       sessile. The polyp was removed with a cold snare. Resection and       retrieval were  complete.      Non-bleeding internal hemorrhoids were  found during retroflexion and       during anoscopy. The hemorrhoids were small and Grade I (internal       hemorrhoids that do not prolapse).      The exam was otherwise without abnormality.      The digital rectal exam findings include enlarged prostate. Impression:           - Diverticulosis in the sigmoid colon and in the                        descending colon.                       - One 7 mm polyp in the transverse colon, removed with                        a cold snare. Resected and retrieved.                       - One 4 mm polyp in the proximal sigmoid colon, removed                        with a cold snare. Resected and retrieved.                       - Non-bleeding internal hemorrhoids.                       - The examination was otherwise normal.                       - Enlarged prostate found on digital rectal exam. Recommendation:       - Discharge patient to home.                       - Await pathology results.                       - Clear liquid diet today.                       - Full liquid diet for 1 day, then advance as tolerated                        to soft diet for 2 days. Procedure Code(s):    --- Professional ---                       [phone], Colonoscopy, flexible; with removal of tumor(s),                        polyp(s), or other lesion(s) by snare technique Diagnosis Code(s):    --- Professional ---                       K64.0, First degree hemorrhoids                       K63.5, Polyp of colon                       Z86.010, Personal history of  colonic polyps                       K57.30, Diverticulosis of large intestine without                        perforation or abscess without bleeding                       N40.0, Benign prostatic hyperplasia without lower                        urinary tract symptoms CPT copyright 2019 [employer] Medical [employer]. All rights reserved. The codes documented in this report are preliminary and upon coder review may   be revised to meet current compliance requirements. Lollie Sails, MD 05/16/2019 8:20:45 AM This report has been signed electronically. Number of Addenda: 0 Note Initiated On: 05/16/2019 7:49 AM Scope Withdrawal Time: 0 hours 9 minutes 13 seconds  Total Procedure Duration: 0 hours 18 minutes 38 seconds       [facility]

## 2019-05-16 NOTE — Anesthesia Preprocedure Evaluation (Addendum)
Anesthesia Evaluation  Patient identified by MRN, date of birth, ID band Patient awake    Reviewed: Allergy & Precautions, H&P , NPO status , Patient's Chart, lab work & pertinent test results  Airway Mallampati: III  TM Distance: >3 FB     Dental  (+) Teeth Intact   Pulmonary neg COPD, former smoker,           Cardiovascular (-) Past MI negative cardio ROS       Neuro/Psych Parkinson's disease s/p DBS negative psych ROS   GI/Hepatic GERD  Controlled,(+)     substance abuse (in remission)  alcohol use,   Endo/Other  negative endocrine ROS  Renal/GU negative Renal ROS  negative genitourinary   Musculoskeletal   Abdominal   Peds  Hematology negative hematology ROS (+)   Anesthesia Other Findings Past Medical History: No date: AAA (abdominal aortic aneurysm) ([facility]) No date: Alcohol dependence in remission ([place]) No date: Cancer ([place])     Comment:  skin  No date: Colon polyps No date: ED (erectile dysfunction) No date: GERD (gastroesophageal reflux disease) No date: Hematest positive stools No date: Melanoma of skin ([facility]) No date: Parkinson's disease ([place]) No date: Renal cyst, right No date: Restless leg syndrome  Past Surgical History: No date: CHOLECYSTECTOMY No date: COLON SURGERY     Comment:  colectomy with anastamosis 11/07/2016: COLONOSCOPY WITH PROPOFOL; N/A     Comment:  Procedure: COLONOSCOPY WITH PROPOFOL;  Surgeon: Lollie Sails, MD;  Location: [facility] ENDOSCOPY;  Service:               Endoscopy;  Laterality: N/A; 09/06/2018: COLONOSCOPY WITH PROPOFOL; N/A     Comment:  Procedure: COLONOSCOPY WITH PROPOFOL;  Surgeon:               Lollie Sails, MD;  Location: [facility] ENDOSCOPY;                Service: Endoscopy;  Laterality: N/A; No date: CYST EXCISION     Comment:  vocal cord No date: DBS placement No date: deep brain implant 09/06/2018: ESOPHAGOGASTRODUODENOSCOPY (EGD)  WITH PROPOFOL; N/A     Comment:  Procedure: ESOPHAGOGASTRODUODENOSCOPY (EGD) WITH               PROPOFOL;  Surgeon: Lollie Sails, MD;  Location:               [facility] ENDOSCOPY;  Service: Endoscopy;  Laterality: N/A; No date: MELANOMA EXCISION No date: TONSILLECTOMY  BMI    Body Mass Index: 25.25 kg/m      Reproductive/Obstetrics negative OB ROS                            Anesthesia Physical Anesthesia Plan  ASA: II  Anesthesia Plan: General   Post-op Pain Management:    Induction:   PONV Risk Score and Plan: Propofol infusion and TIVA  Airway Management Planned: Natural Airway and Nasal Cannula  Additional Equipment:   Intra-op Plan:   Post-operative Plan:   Informed Consent: I have reviewed the patients History and Physical, chart, labs and discussed the procedure including the risks, benefits and alternatives for the proposed anesthesia with the patient or authorized representative who has indicated his/her understanding and acceptance.     Dental Advisory Given  Plan Discussed with: Anesthesiologist and CRNA  Anesthesia Plan Comments:  Anesthesia Quick Evaluation  

## 2019-05-17 ENCOUNTER — Encounter: Payer: Self-pay | Admitting: Gastroenterology

## 2019-05-17 LAB — SURGICAL PATHOLOGY

## 2019-05-31 ENCOUNTER — Other Ambulatory Visit: Payer: Self-pay | Admitting: Gastroenterology

## 2019-05-31 DIAGNOSIS — R17 Unspecified jaundice: Secondary | ICD-10-CM

## 2019-06-04 ENCOUNTER — Ambulatory Visit
Admission: RE | Admit: 2019-06-04 | Discharge: 2019-06-04 | Disposition: A | Discharge status: Home / Self Care | Payer: Medicare HMO | Source: Ambulatory Visit | Attending: Gastroenterology | Admitting: Gastroenterology

## 2019-06-04 ENCOUNTER — Other Ambulatory Visit: Payer: Self-pay

## 2019-06-04 DIAGNOSIS — R17 Unspecified jaundice: Secondary | ICD-10-CM | POA: Diagnosis not present

## 2019-12-20 ENCOUNTER — Emergency Department
Admission: EM | Admit: 2019-12-20 | Discharge: 2019-12-20 | Disposition: A | Discharge status: Home / Self Care | Payer: Medicare HMO | Attending: Emergency Medicine | Admitting: Emergency Medicine

## 2019-12-20 ENCOUNTER — Emergency Department: Payer: Medicare HMO

## 2019-12-20 ENCOUNTER — Other Ambulatory Visit: Payer: Self-pay

## 2019-12-20 DIAGNOSIS — Z85828 Personal history of other malignant neoplasm of skin: Secondary | ICD-10-CM | POA: Diagnosis not present

## 2019-12-20 DIAGNOSIS — Z79899 Other long term (current) drug therapy: Secondary | ICD-10-CM | POA: Diagnosis not present

## 2019-12-20 DIAGNOSIS — Z23 Encounter for immunization: Secondary | ICD-10-CM | POA: Diagnosis not present

## 2019-12-20 DIAGNOSIS — Y9239 Other specified sports and athletic area as the place of occurrence of the external cause: Secondary | ICD-10-CM | POA: Diagnosis not present

## 2019-12-20 DIAGNOSIS — T07XXXA Unspecified multiple injuries, initial encounter: Secondary | ICD-10-CM

## 2019-12-20 DIAGNOSIS — T148XXA Other injury of unspecified body region, initial encounter: Secondary | ICD-10-CM | POA: Insufficient documentation

## 2019-12-20 DIAGNOSIS — G20 Parkinson's disease: Secondary | ICD-10-CM | POA: Diagnosis not present

## 2019-12-20 DIAGNOSIS — Y999 Unspecified external cause status: Secondary | ICD-10-CM | POA: Diagnosis not present

## 2019-12-20 DIAGNOSIS — Y9389 Activity, other specified: Secondary | ICD-10-CM | POA: Diagnosis not present

## 2019-12-20 DIAGNOSIS — W010XXA Fall on same level from slipping, tripping and stumbling without subsequent striking against object, initial encounter: Secondary | ICD-10-CM | POA: Insufficient documentation

## 2019-12-20 DIAGNOSIS — Z87891 Personal history of nicotine dependence: Secondary | ICD-10-CM | POA: Diagnosis not present

## 2019-12-20 DIAGNOSIS — S0083XA Contusion of other part of head, initial encounter: Secondary | ICD-10-CM | POA: Diagnosis not present

## 2019-12-20 DIAGNOSIS — M25511 Pain in right shoulder: Secondary | ICD-10-CM | POA: Diagnosis present

## 2019-12-20 MED ORDER — TETANUS-DIPHTH-ACELL PERTUSSIS 5-2.5-18.5 LF-MCG/0.5 IM SUSP
0.5000 mL | Freq: Once | INTRAMUSCULAR | Status: AC
  Administered 2019-12-20: 21:00:00 0.5 mL via INTRAMUSCULAR
  Filled 2019-12-20: qty 0.5

## 2019-12-20 NOTE — ED Notes (Signed)
Patient taken to CT scan.

## 2019-12-20 NOTE — Discharge Instructions (Addendum)
Your CT scan of your head and x-rays were all okay today.

## 2019-12-20 NOTE — ED Notes (Signed)
E signature pad not available.  

## 2019-12-20 NOTE — ED Triage Notes (Signed)
Patient reports he got to moving to fast lost his balance and fell.  Patient also reports history of parkinson's.  Patient denies loss of consciousness or taking any type of blood thinners.  Patient reports pain to right shoulder and right knee.  Patient with multiple abrasions noted.

## 2019-12-20 NOTE — ED Notes (Signed)
Patient declined discharge vital signs. Patient's wife is at bedside.
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
## 2019-12-27 ENCOUNTER — Other Ambulatory Visit: Payer: Self-pay | Admitting: Orthopaedic Surgery

## 2019-12-27 DIAGNOSIS — M25511 Pain in right shoulder: Secondary | ICD-10-CM

## 2019-12-30 ENCOUNTER — Ambulatory Visit
Admission: RE | Admit: 2019-12-30 | Discharge: 2019-12-30 | Disposition: A | Discharge status: Home / Self Care | Payer: Medicare HMO | Source: Ambulatory Visit | Attending: Orthopaedic Surgery | Admitting: Orthopaedic Surgery

## 2019-12-30 ENCOUNTER — Other Ambulatory Visit: Payer: Medicare HMO

## 2019-12-30 DIAGNOSIS — M25511 Pain in right shoulder: Secondary | ICD-10-CM

## 2020-01-09 ENCOUNTER — Other Ambulatory Visit: Payer: Medicare HMO

## 2020-04-24 DIAGNOSIS — C4442 Squamous cell carcinoma of skin of scalp and neck: Secondary | ICD-10-CM | POA: Diagnosis present

## 2020-07-03 DIAGNOSIS — Z8659 Personal history of other mental and behavioral disorders: Secondary | ICD-10-CM

## 2020-07-03 HISTORY — DX: Personal history of other mental and behavioral disorders: Z86.59

## 2020-07-09 ENCOUNTER — Emergency Department: Payer: Medicare HMO

## 2020-07-09 ENCOUNTER — Observation Stay
Admission: EM | Admit: 2020-07-09 | Discharge: 2020-07-14 | Disposition: A | Discharge status: Home / Self Care | Payer: Medicare HMO | Attending: Family Medicine | Admitting: Family Medicine

## 2020-07-09 ENCOUNTER — Other Ambulatory Visit: Payer: Self-pay

## 2020-07-09 DIAGNOSIS — G9340 Encephalopathy, unspecified: Secondary | ICD-10-CM | POA: Diagnosis not present

## 2020-07-09 DIAGNOSIS — Z9689 Presence of other specified functional implants: Secondary | ICD-10-CM

## 2020-07-09 DIAGNOSIS — R4182 Altered mental status, unspecified: Secondary | ICD-10-CM

## 2020-07-09 DIAGNOSIS — Z20822 Contact with and (suspected) exposure to covid-19: Secondary | ICD-10-CM | POA: Diagnosis not present

## 2020-07-09 DIAGNOSIS — G20 Parkinson's disease: Secondary | ICD-10-CM | POA: Insufficient documentation

## 2020-07-09 DIAGNOSIS — R4189 Other symptoms and signs involving cognitive functions and awareness: Secondary | ICD-10-CM | POA: Diagnosis present

## 2020-07-09 DIAGNOSIS — Z87891 Personal history of nicotine dependence: Secondary | ICD-10-CM | POA: Insufficient documentation

## 2020-07-09 DIAGNOSIS — G9341 Metabolic encephalopathy: Secondary | ICD-10-CM

## 2020-07-09 DIAGNOSIS — N179 Acute kidney failure, unspecified: Secondary | ICD-10-CM | POA: Diagnosis not present

## 2020-07-09 DIAGNOSIS — D72829 Elevated white blood cell count, unspecified: Secondary | ICD-10-CM

## 2020-07-09 DIAGNOSIS — C4442 Squamous cell carcinoma of skin of scalp and neck: Secondary | ICD-10-CM | POA: Diagnosis present

## 2020-07-09 LAB — COMPREHENSIVE METABOLIC PANEL
ALT: 29 U/L (ref 0–44)
AST: 28 U/L (ref 15–41)
Albumin: 4.4 g/dL (ref 3.5–5.0)
Alkaline Phosphatase: 80 U/L (ref 38–126)
Anion gap: 12 (ref 5–15)
BUN: 36 mg/dL — ABNORMAL HIGH (ref 8–23)
CO2: 22 mmol/L (ref 22–32)
Calcium: 9.7 mg/dL (ref 8.9–10.3)
Chloride: 105 mmol/L (ref 98–111)
Creatinine, Ser: 1.84 mg/dL — ABNORMAL HIGH (ref 0.61–1.24)
GFR, Estimated: 38 mL/min — ABNORMAL LOW (ref >60)
Glucose, Bld: 135 mg/dL — ABNORMAL HIGH (ref 70–99)
Potassium: 3.5 mmol/L (ref 3.5–5.1)
Sodium: 139 mmol/L (ref 135–145)
Total Bilirubin: 3 mg/dL — ABNORMAL HIGH (ref 0.3–1.2)
Total Protein: 8.1 g/dL (ref 6.5–8.1)

## 2020-07-09 LAB — URINALYSIS, COMPLETE (UACMP) WITH MICROSCOPIC
Bacteria, UA: NONE SEEN
Bilirubin Urine: NEGATIVE
Glucose, UA: NEGATIVE mg/dL
Hgb urine dipstick: NEGATIVE
Ketones, ur: 5 mg/dL — AB
Nitrite: NEGATIVE
Protein, ur: 30 mg/dL — AB
Specific Gravity, Urine: 1.025 (ref 1.005–1.030)
pH: 5 (ref 5.0–8.0)

## 2020-07-09 LAB — CBC
HCT: 46 % (ref 39.0–52.0)
Hemoglobin: 15.3 g/dL (ref 13.0–17.0)
MCH: 31 pg (ref 26.0–34.0)
MCHC: 33.3 g/dL (ref 30.0–36.0)
MCV: 93.1 fL (ref 80.0–100.0)
Platelets: 218 10*3/uL (ref 150–400)
RBC: 4.94 MIL/uL (ref 4.22–5.81)
RDW: 13.6 % (ref 11.5–15.5)
WBC: 18.1 10*3/uL — ABNORMAL HIGH (ref 4.0–10.5)
nRBC: 0 % (ref 0.0–0.2)

## 2020-07-09 LAB — LACTIC ACID, PLASMA: Lactic Acid, Venous: 1 mmol/L (ref 0.5–1.9)

## 2020-07-09 LAB — TROPONIN I (HIGH SENSITIVITY): Troponin I (High Sensitivity): 10 ng/L (ref <18)

## 2020-07-09 MED ORDER — LACTATED RINGERS IV BOLUS
1000.0000 mL | Freq: Once | INTRAVENOUS | Status: AC
  Administered 2020-07-10: 1000 mL via INTRAVENOUS

## 2020-07-09 NOTE — ED Triage Notes (Signed)
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 Pt was combative post anesthesia and has been altered and agitated since. PT was put on seroquel following procedure. New onset of foul smelling urine.  100/70 HR 68 RR 20

## 2020-07-09 NOTE — ED Notes (Signed)
Lab called to add on Troponin. Per lab staff, will be put into process at this time.

## 2020-07-09 NOTE — ED Provider Notes (Signed)
-----------------------------------------   11:05 PM on 07/09/2020 -----------------------------------------  Assuming care from Dr. Cherylann Banas.  In short, Wesley Anderson. is a 74 y.o. male with a chief complaint of AMS/encephalopathy.  Refer to the original H&P for additional details.  The current plan of care is to admit the patient for AKI and acute encephalopathy.   ----------------------------------------- 11:39 PM on 07/09/2020 -----------------------------------------  Discussed case by phone with Dr. Damita Dunnings with the hospitalist service who will admit.  Patient is voluntary at this time, though his capacity is questionable, so he may need to be IVC'd if his behavior degenerates and if he tries to leave or becomes aggressive.   Hinda Kehr, MD 07/09/20 2340
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
## 2020-07-10 ENCOUNTER — Encounter: Payer: Self-pay | Admitting: Internal Medicine

## 2020-07-10 DIAGNOSIS — G9341 Metabolic encephalopathy: Secondary | ICD-10-CM | POA: Diagnosis not present

## 2020-07-10 LAB — CBC WITH DIFFERENTIAL/PLATELET
Abs Immature Granulocytes: 0.03 10*3/uL (ref 0.00–0.07)
Basophils Absolute: 0 10*3/uL (ref 0.0–0.1)
Basophils Relative: 0 %
Eosinophils Absolute: 0.1 10*3/uL (ref 0.0–0.5)
Eosinophils Relative: 1 %
HCT: 41.5 % (ref 39.0–52.0)
Hemoglobin: 14 g/dL (ref 13.0–17.0)
Immature Granulocytes: 0 %
Lymphocytes Relative: 10 %
Lymphs Abs: 1.1 10*3/uL (ref 0.7–4.0)
MCH: 30.8 pg (ref 26.0–34.0)
MCHC: 33.7 g/dL (ref 30.0–36.0)
MCV: 91.2 fL (ref 80.0–100.0)
Monocytes Absolute: 0.8 10*3/uL (ref 0.1–1.0)
Monocytes Relative: 7 %
Neutro Abs: 9.1 10*3/uL — ABNORMAL HIGH (ref 1.7–7.7)
Neutrophils Relative %: 82 %
Platelets: 194 10*3/uL (ref 150–400)
RBC: 4.55 MIL/uL (ref 4.22–5.81)
RDW: 13.7 % (ref 11.5–15.5)
WBC: 11.2 10*3/uL — ABNORMAL HIGH (ref 4.0–10.5)
nRBC: 0.2 % (ref 0.0–0.2)

## 2020-07-10 LAB — COMPREHENSIVE METABOLIC PANEL
ALT: 29 U/L (ref 0–44)
AST: 20 U/L (ref 15–41)
Albumin: 3.5 g/dL (ref 3.5–5.0)
Alkaline Phosphatase: 69 U/L (ref 38–126)
Anion gap: 9 (ref 5–15)
BUN: 30 mg/dL — ABNORMAL HIGH (ref 8–23)
CO2: 21 mmol/L — ABNORMAL LOW (ref 22–32)
Calcium: 9.3 mg/dL (ref 8.9–10.3)
Chloride: 108 mmol/L (ref 98–111)
Creatinine, Ser: 1.27 mg/dL — ABNORMAL HIGH (ref 0.61–1.24)
GFR, Estimated: >60 mL/min (ref >60)
Glucose, Bld: 127 mg/dL — ABNORMAL HIGH (ref 70–99)
Potassium: 3.6 mmol/L (ref 3.5–5.1)
Sodium: 138 mmol/L (ref 135–145)
Total Bilirubin: 2.8 mg/dL — ABNORMAL HIGH (ref 0.3–1.2)
Total Protein: 6.8 g/dL (ref 6.5–8.1)

## 2020-07-10 LAB — GASTROINTESTINAL PANEL BY PCR, STOOL (REPLACES STOOL CULTURE)

## 2020-07-10 LAB — C DIFFICILE QUICK SCREEN W PCR REFLEX
C Diff antigen: NEGATIVE
C Diff interpretation: NOT DETECTED
C Diff toxin: NEGATIVE

## 2020-07-10 LAB — LACTIC ACID, PLASMA: Lactic Acid, Venous: 1.4 mmol/L (ref 0.5–1.9)

## 2020-07-10 LAB — RESP PANEL BY RT-PCR (FLU A&B, COVID) ARPGX2
Influenza A by PCR: NEGATIVE
Influenza B by PCR: NEGATIVE
SARS Coronavirus 2 by RT PCR: NEGATIVE

## 2020-07-10 LAB — TROPONIN I (HIGH SENSITIVITY): Troponin I (High Sensitivity): 9 ng/L (ref <18)

## 2020-07-10 LAB — MAGNESIUM: Magnesium: 2.1 mg/dL (ref 1.7–2.4)

## 2020-07-10 LAB — AMMONIA: Ammonia: 18 umol/L (ref 9–35)

## 2020-07-10 MED ORDER — ACETAMINOPHEN 325 MG PO TABS: 650.0000 mg | ORAL_TABLET | Freq: Four times a day (QID) | ORAL | Status: DC | PRN

## 2020-07-10 MED ORDER — DONEPEZIL HCL 5 MG PO TABS
5.0000 mg | ORAL_TABLET | Freq: Every day | ORAL | Status: DC
  Administered 2020-07-10 – 2020-07-13 (×5): 5 mg via ORAL
  Filled 2020-07-10 (×5): qty 1

## 2020-07-10 MED ORDER — ONDANSETRON HCL 4 MG PO TABS
4.0000 mg | ORAL_TABLET | Freq: Four times a day (QID) | ORAL | Status: DC | PRN
  Administered 2020-07-12: 4 mg via ORAL
  Filled 2020-07-10: qty 1

## 2020-07-10 MED ORDER — SODIUM CHLORIDE 0.9 % IV SOLN
1.0000 g | Freq: Every day | INTRAVENOUS | Status: DC
  Administered 2020-07-10 – 2020-07-13 (×5): 1 g via INTRAVENOUS
  Filled 2020-07-10: qty 1
  Filled 2020-07-10 (×4): qty 10
  Filled 2020-07-10 (×2): qty 1

## 2020-07-10 MED ORDER — MELATONIN 5 MG PO TABS
5.0000 mg | ORAL_TABLET | Freq: Every day | ORAL | Status: DC
  Administered 2020-07-10 – 2020-07-13 (×4): 5 mg via ORAL
  Filled 2020-07-10 (×5): qty 1

## 2020-07-10 MED ORDER — CARBIDOPA-LEVODOPA 25-100 MG PO TABS
1.5000 | ORAL_TABLET | Freq: Three times a day (TID) | ORAL | Status: DC
  Administered 2020-07-10 – 2020-07-14 (×13): 1.5 via ORAL
  Filled 2020-07-10 (×2): qty 1.5
  Filled 2020-07-10: qty 2
  Filled 2020-07-10 (×3): qty 1.5
  Filled 2020-07-10: qty 2
  Filled 2020-07-10: qty 1.5
  Filled 2020-07-10: qty 2
  Filled 2020-07-10 (×8): qty 1.5

## 2020-07-10 MED ORDER — SODIUM CHLORIDE 0.9 % IV SOLN: INTRAVENOUS | Status: DC

## 2020-07-10 MED ORDER — CARBIDOPA-LEVODOPA ER 25-100 MG PO TBCR: 1.5000 | EXTENDED_RELEASE_TABLET | Freq: Three times a day (TID) | ORAL | Status: DC

## 2020-07-10 MED ORDER — PANTOPRAZOLE SODIUM 40 MG PO TBEC
40.0000 mg | DELAYED_RELEASE_TABLET | Freq: Every day | ORAL | Status: DC
  Administered 2020-07-10 – 2020-07-14 (×5): 40 mg via ORAL
  Filled 2020-07-10 (×5): qty 1

## 2020-07-10 MED ORDER — ROPINIROLE HCL ER 4 MG PO TB24
8.0000 mg | ORAL_TABLET | Freq: Every day | ORAL | Status: DC
  Administered 2020-07-10 – 2020-07-13 (×4): 8 mg via ORAL
  Filled 2020-07-10 (×2): qty 2
  Filled 2020-07-10: qty 1
  Filled 2020-07-10 (×2): qty 2

## 2020-07-10 MED ORDER — ENOXAPARIN SODIUM 40 MG/0.4ML ~~LOC~~ SOLN
40.0000 mg | SUBCUTANEOUS | Status: DC
  Administered 2020-07-10: 40 mg via SUBCUTANEOUS
  Filled 2020-07-10: qty 0.4

## 2020-07-10 MED ORDER — ACETAMINOPHEN 650 MG RE SUPP: 650.0000 mg | Freq: Four times a day (QID) | RECTAL | Status: DC | PRN

## 2020-07-10 MED ORDER — ONDANSETRON HCL 4 MG/2ML IJ SOLN: 4.0000 mg | Freq: Four times a day (QID) | INTRAMUSCULAR | Status: DC | PRN

## 2020-07-10 MED ORDER — QUETIAPINE FUMARATE 25 MG PO TABS
25.0000 mg | ORAL_TABLET | Freq: Every day | ORAL | Status: DC
  Administered 2020-07-10 – 2020-07-13 (×5): 25 mg via ORAL
  Filled 2020-07-10 (×6): qty 1

## 2020-07-10 NOTE — ED Notes (Signed)
Patient provided with warm blanket per request. Resting in bed free from sign of distress with cardiac monitor in place. Bed low and locked with side rails raised x2 and call bell in reach.

## 2020-07-10 NOTE — ED Notes (Signed)
Pt had loose yellow/brown BM. Sample sent to lab as previous RN stated pt had already had 3 loose BMs this morning. Peri care provided. Briefs and bedpad changed. Pt repositioned.

## 2020-07-10 NOTE — Progress Notes (Signed)
PROGRESS NOTE    Wesley Anderson.  KXF:[id number] DOB: [birth year] DOA: 07/09/2020 PCP: Adin Hector, MD   Brief Narrative:  HPI: Wesley Nylund. is a 74 y.o. male with medical history significant for Parkinson's disease with electronic brain stimulator, cognitive impairment, with history of squamous cell carcinoma status post excision on 11/19 under general anesthesia, complicated by postoperative delirium, discharged from [facility] on 11/22, seen by his neurologist on 1122 with medication adjustment was brought into the emergency room for evaluation of altered mental status.  Most of the history is taken from wife who stated that on his discharge on 11/21, his confusion appeared to have improved however over the past 24 to 48 hours they have noted increasing confusion and ' talking out of his head'.  His wife states that he seems to be seeing things, was unable to recognize family members, he was stating that his family members will robots trying to give him injections as well as several other things of bizarre content.  She states that while hospitalized he was seen by a psychiatrist and started on Seroquel and after seeing his neurologist a few days after, he had his amantadine discontinued and his carbidopa dose increased and was also started on melatonin and Aricept.  His neurologist did blood work to include B12, and TSH both of which were normal on Care Everywhere.  Wife states she has been having difficulty giving him his medication including the antibiotics prescribed at discharge and his other meds.  He did complain of a bit of nausea but has had no abdominal pain, change in bowel habits, dysuria, vomiting.  He has had no complaints of cough, chest pain or shortness of breath and no fever or chills.   ED Course: On arrival, patient was afebrile, heart rate 81, respiration 26 with O2 sat 94% on room air.  Blood pressure 105/62.  Blood work significant for WBC of 18,000, and creatinine of  1.84 up from baseline of 1.1 on Care Everywhere.  Bilirubin slightly elevated at 3.  Urinalysis without pyuria but with ketones.  Lactic acid 1.  Troponin 10 Chest x-ray with no acute findings CT head no acute intracranial findings EKG as reviewed by me : Normal sinus rhythm Patient was treated with an IV fluid bolus.  Hospitalist consulted for admission.   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  Patient this morning on my evaluation was completely alert and oriented.  He was slightly lethargic.  He answers all the questions appropriately regarding his orientation except he could not tell me the name of the hospital.  He was discharged from the hospital on 07/06/2020 with 7 days of Keflex which he has not been taking according to the family.  We will continue Rocephin for that.  Wound care has been consulted.  They do not think the wound is infected however we will continue antibiotics as recommended by his surgeon.  Patient has a history of Parkinson's disease which can easily put him at  risk of delirium especially when he is sick with infection or so.  He also came in with elevated leukocytosis and elevated creatinine, indicating severe dehydration.  He still looks dehydrated.  White cells and creatinine has improved but we will continue IV fluids and repeat labs in the morning.  Continue nightly Seroquel as well.  AKI/dehydration: As mentioned above.  Continue IV fluids.    Parkinson's  disease ([place])   S/P deep brain stimulator placement -Medication was adjusted by neurologist during visit on 11/22 -Continue Sinemet, 1.5 tabs 3 times daily and ropinirole 8 mg nightly.  Amantadine was discontinued  DVT prophylaxis:    Code Status: Full Code  Family Communication: None present at bedside.  Plan of care discussed with patient and then discussed with his son over the phone.  His son states that patient was very agitated and combative yesterday with the family and that his wife is not comfortable taking him home.  He expressed his idea of possibly sending this patient to rehab or memory care unit.  I have consulted PT OT to assess him.  Status is: Observation  The patient will require care spanning > 2 midnights and should be moved to inpatient because: Inpatient level of care appropriate due to severity of illness  Dispo: The patient is from: Home              Anticipated d/c is to: SNF              Anticipated d/c date is: 1 day              Patient currently is not medically stable to d/c.        Estimated body mass index is 26.7 kg/m as calculated from the following:   Height as of this encounter: 5\' 9"  (1.753 m).   Weight as of this encounter: 82 kg.      Nutritional status:               Consultants:   None  Procedures:   None  Antimicrobials:  Anti-infectives (From admission, onward)   Start     Dose/Rate Route Frequency Ordered Stop   07/10/20 0030  cefTRIAXone (ROCEPHIN) 1 g in sodium chloride 0.9 % 100 mL IVPB        1 g 200 mL/hr over 30 Minutes Intravenous Daily-1800 07/10/20 0014           Subjective: Seen and examined this morning in the ED.  Patient was slightly lethargic but easily arousable and almost completely oriented.  Denied any complaint.  He also was able to tell me his recent hospitalization and the reason for that.  Objective: Vitals:   07/10/20 1000 07/10/20 1030 07/10/20 1045 07/10/20 1100  BP: 105/69 118/81   137/86  Pulse: 67 66  63  Resp: (!) 21 (!) 23 (!) 23   Temp:      TempSrc:      SpO2: 95% 96%  95%  Weight:      Height:        Intake/Output Summary (Last 24 hours) at 07/10/2020 1112 Last data filed at 07/10/2020 0221 Gross per 24 hour  Intake 1100 ml  Output 100 ml  Net 1000 ml   Filed Weights   07/09/20 2136  Weight: 82 kg    Examination:  General exam: Appears calm and comfortable, slightly lethargic Respiratory system: Clear to auscultation. Respiratory effort normal. Cardiovascular system: S1 & S2 heard, RRR. No JVD, murmurs, rubs, gallops  or clicks. No pedal edema. Gastrointestinal system: Abdomen is nondistended, soft and nontender. No organomegaly or masses felt. Normal bowel sounds heard. Central nervous system: Lethargic but fully oriented. No focal neurological deficits. Extremities: Symmetric 5 x 5 power. Skin: Sutures in incision on the scalp.    Data Reviewed: I have personally reviewed following labs and imaging studies  CBC: Recent Labs  Lab 07/09/20 2140 07/10/20 0811  WBC 18.1* 11.2*  NEUTROABS  --  9.1*  HGB 15.3 14.0  HCT 46.0 41.5  MCV 93.1 91.2  PLT 218 737   Basic Metabolic Panel: Recent Labs  Lab 07/09/20 2140 07/10/20 0811  NA 139 138  K 3.5 3.6  CL 105 108  CO2 22 21*  GLUCOSE 135* 127*  BUN 36* 30*  CREATININE 1.84* 1.27*  CALCIUM 9.7 9.3  MG  --  2.1   GFR: Estimated Creatinine Clearance: 52.6 mL/min (A) (by C-G formula based on SCr of 1.27 mg/dL (H)). Liver Function Tests: Recent Labs  Lab 07/09/20 2140 07/10/20 0811  AST 28 20  ALT 29 29  ALKPHOS 80 69  BILITOT 3.0* 2.8*  PROT 8.1 6.8  ALBUMIN 4.4 3.5   No results for input(s): LIPASE, AMYLASE in the last 168 hours. Recent Labs  Lab 07/10/20 0811  AMMONIA 18   Coagulation Profile: No results for input(s): INR, PROTIME in the last 168 hours. Cardiac Enzymes: No results for input(s): CKTOTAL, CKMB, CKMBINDEX, TROPONINI in the last 168 hours. BNP  (last 3 results) No results for input(s): PROBNP in the last 8760 hours. HbA1C: No results for input(s): HGBA1C in the last 72 hours. CBG: No results for input(s): GLUCAP in the last 168 hours. Lipid Profile: No results for input(s): CHOL, HDL, LDLCALC, TRIG, CHOLHDL, LDLDIRECT in the last 72 hours. Thyroid Function Tests: No results for input(s): TSH, T4TOTAL, FREET4, T3FREE, THYROIDAB in the last 72 hours. Anemia Panel: No results for input(s): VITAMINB12, FOLATE, FERRITIN, TIBC, IRON, RETICCTPCT in the last 72 hours. Sepsis Labs: Recent Labs  Lab 07/09/20 2251 07/10/20 0119  LATICACIDVEN 1.0 1.4    Recent Results (from the past 240 hour(s))  Resp Panel by RT-PCR (Flu A&B, Covid) Nasopharyngeal Swab     Status: None   Collection Time: 07/09/20 10:51 PM   Specimen: Nasopharyngeal Swab; Nasopharyngeal(NP) swabs in vial transport medium  Result Value Ref Range Status   SARS Coronavirus 2 by RT PCR NEGATIVE NEGATIVE Final    Comment: (NOTE) SARS-CoV-2 target nucleic acids are NOT DETECTED.  The SARS-CoV-2 RNA is generally detectable in upper respiratory specimens during the acute phase of infection. The lowest concentration of SARS-CoV-2 viral copies this assay can detect is 138 copies/mL. A negative result does not preclude SARS-Cov-2 infection and should not be used as the sole basis for treatment or other patient management decisions. A negative result may occur with  improper specimen collection/handling, submission of specimen other than nasopharyngeal swab, presence of viral mutation(s) within the areas targeted by this assay, and inadequate number of viral copies(<138 copies/mL). A negative result must be combined with clinical observations, patient history, and epidemiological information. The expected result is Negative.  Fact Sheet for Patients:  EntrepreneurPulse.com.au  Fact Sheet for Healthcare Providers:    IncredibleEmployment.be  This test is no t yet approved or cleared by the [place] FDA and  has been authorized for detection and/or diagnosis of SARS-CoV-2 by FDA under an Emergency Use Authorization (EUA). This EUA will remain  in effect (meaning this test can be  used) for the duration of the COVID-19 declaration under Section 564(b)(1) of the Act, 21 U.S.C.section 360bbb-3(b)(1), unless the authorization is terminated  or revoked sooner.       Influenza A by PCR NEGATIVE NEGATIVE Final   Influenza B by PCR NEGATIVE NEGATIVE Final    Comment: (NOTE) The Xpert Xpress SARS-CoV-2/FLU/RSV plus assay is intended as an aid in the diagnosis of influenza from Nasopharyngeal swab specimens and should not be used as a sole basis for treatment. Nasal washings and aspirates are unacceptable for Xpert Xpress SARS-CoV-2/FLU/RSV testing.  Fact Sheet for Patients: EntrepreneurPulse.com.au  Fact Sheet for Healthcare Providers: IncredibleEmployment.be  This test is not yet approved or cleared by the [place] FDA and has been authorized for detection and/or diagnosis of SARS-CoV-2 by FDA under an Emergency Use Authorization (EUA). This EUA will remain in effect (meaning this test can be used) for the duration of the COVID-19 declaration under Section 564(b)(1) of the Act, 21 U.S.C. section 360bbb-3(b)(1), unless the authorization is terminated or revoked.  Performed at [facility], [address] [place], [place] [address]       Radiology Studies: CT Head Wo Contrast  Result Date: 07/09/2020 CLINICAL DATA:  Mental status change, skin cancer lesion removed from head EXAM: CT HEAD WITHOUT CONTRAST TECHNIQUE: Contiguous axial images were obtained from the base of the skull through the vertex without intravenous contrast. COMPARISON:  CT brain 12/20/2019 FINDINGS: Brain: No acute territorial infarction, hemorrhage,  or intracranial mass. Mild atrophy. Mild hypodensity in the white matter presumably chronic small vessel ischemic change. Stable ventricle size. Right transfrontal deep brain stimulator lead. Vascular: No hyperdense vessels.  Carotid vascular calcification. Skull: Right frontal burr hole.  No fracture Sinuses/Orbits: No acute finding. Other: Scalp defect with packing material at the cranial vertex anteriorly presumably corresponding to history of skin lesion removal IMPRESSION: 1. No CT evidence for acute intracranial abnormality. 2. Atrophy and mild chronic small vessel ischemic changes of the white matter. Electronically Signed   By: Donavan Foil M.D.   On: 07/09/2020 22:46   DG Chest Portable 1 View  Result Date: 07/09/2020 CLINICAL DATA:  Weakness EXAM: PORTABLE CHEST 1 VIEW COMPARISON:  None. FINDINGS: A stimulator pack is noted projecting over the right chest wall. The heart size is mildly enlarged. Aortic calcifications are noted. There is no pneumothorax. No large pleural effusion. Streaky bibasilar airspace opacities are noted favored to represent areas of atelectasis and scarring. IMPRESSION: 1. No acute cardiopulmonary process. 2. Bibasilar atelectasis and scarring. 3. Mild cardiomegaly. 4. Aortic calcifications. Electronically Signed   By: Constance Holster M.D.   On: 07/09/2020 22:12    Scheduled Meds: . carbidopa-levodopa  1.5 tablet Oral TID  . donepezil  5 mg Oral QHS  . melatonin  5 mg Oral QHS  . pantoprazole  40 mg Oral Daily  . QUEtiapine  25 mg Oral QHS  . rOPINIRole  8 mg Oral QHS   Continuous Infusions: . sodium chloride 100 mL/hr at 07/10/20 0933  . cefTRIAXone (ROCEPHIN)  IV Stopped (07/10/20 0221)     LOS: 0 days   Time spent: 39 minutes   Darliss Cheney, MD [facility]  07/10/2020, 11:12 AM   To contact the attending provider between 7A-7P or the covering provider during after hours 7P-7A, please log into the web site [email or web address].

## 2020-07-10 NOTE — ED Notes (Signed)
Patient becoming increasingly agitated attempting to get out of bed. Bed alarm ringing and RN was able to safely return patient to bed. Patient continuously yelling at RN. RN attempted to de-escalate patient and re-direct to no avail. Pt continually yelling "I want to go inside!" Patient informed of placement in ER while awaiting hospital room and RN reinforced that patient is being admitted to treat a suspected infection. Patient responds to this with "that's a lie this is crazy!" Pt also continually yelling that  "this is crazy and you can't keep me in here like this!" RN repeatedly asked what she could do to make the patient more comfortable. RN offered to turn the tv on for distraction and offered to try and find the patient a hospital bed to promote comfort. Both offers were ignored by patient and followed with patient attempting to get out of bed. When RN safely assisted patient back into bed patient began yelling and hitting the bed and balling up fists. RN moved back and worked to de-escalate patient. Patient now positioned in bed with bed low and locked with both side rails raised. Cardiac monitor in place as well as fall alarm. Reverse trendelenburg in use as well as elevated knees. Patient offered warm blanket and declined.

## 2020-07-10 NOTE — ED Notes (Signed)
Patient had large amount soft brown stool in diaper. Changed.  Son is here to see.

## 2020-07-10 NOTE — ED Notes (Signed)
Attending provider [place] notified via secure chat about diarrhea x4 and that sample sent to lab.

## 2020-07-10 NOTE — ED Notes (Addendum)
OT at bedside to work with pt

## 2020-07-10 NOTE — ED Notes (Signed)
MD Damita Dunnings at bedside.

## 2020-07-10 NOTE — ED Notes (Signed)
Pt had another episode of diarrhea. Peri care provided. Linens and briefs changed.

## 2020-07-10 NOTE — ED Notes (Signed)
Advised nurse that patient has assigned bed 

## 2020-07-10 NOTE — ED Notes (Signed)
Patient had large amount liquid brown stool in diaper.  Cleaned and changed.

## 2020-07-10 NOTE — ED Notes (Signed)
Responded to call bell. Pt tried to use urinal and missed opening. Pt pants and shirt soiled. Pt changed, cleaned and placed in new brief.

## 2020-07-10 NOTE — ED Notes (Signed)
Pt's son switched out with daughter to visit at bedside.

## 2020-07-10 NOTE — Consult Note (Signed)
[place] Nurse Consult Note: Reason for Consult: evaluation of "head laceration" Actually patient has had complex skin cancer removal from the top of his head at [facility], scheduled for follow up Monday 07/13/20 per CG in the room. Wound type: surgical  Pressure Injury POA: NA Measurement:unable to measure, closed with sutures. Complex suture pattern over the entire anterior surface of the head along with gauze packing sutured in place  Wound bed: no open wounds Drainage (amount, consistency, odor)serous   Periwound: intact;  Dressing procedure/placement/frequency: Cover entire area with single layer of xerform, top with 4x4 gauze or ABD pads, secure with kerlix or mesh underwear used as stocking cap.   Follow up with [facility] as outpatient.   Discussed POC with patient and bedside nurse.  Re consult if needed, will not follow at this time. Thanks  Teniya Filter [employer], RN,CWOCN, CNS, [place] [phone])

## 2020-07-10 NOTE — Evaluation (Signed)
Physical Therapy Evaluation Patient Details Name: Wesley Anderson. MRN: [id number] DOB: December 10, 1945 Today's Date: 07/10/2020   History of Present Illness  Wesley Anderson is a 18yoM who comes to [facility] on 11/25 c AMS. Pt underwent precedure 1 week ago at [facility] and was subsequently combative adn altered. PMH: PD c deep brain stimulator, cognifive impairment, squamous cell carcinoma s/p excision. At baseline pt live with wife, uses a RW for AMB in home, mostly household AMB, does not typically need any assist for ADL completion.  Clinical Impression  Pt admitted with above diagnosis. Pt currently with functional limitations due to the deficits listed below (see "PT Problem List"). Upon entry, pt in bed, awake and agreeable to participate. Pt is working with OT, wife at bedside. The pt is alert and oriented x3, pleasant, conversational, remains slightly confused, but less so than overnight. Pt able to follow simple commands well, but is somewhat impulsive at times. MinA for bed mobiilty, minA for transfers with RW (struggles to follow cues for technique), tolerates a short, slow walking in room with RW, but has cardiac rhythm and rate abnormality that preclude longer AMB at this time- long periods of bradycardia in 40s-50s while seated, rates in 80s-90s while up walking, but rhythm is irregular, appears to have significant a flutter at times. Functional mobility assessment demonstrates increased effort/time requirements, poor tolerance, and need for physical assistance, whereas the patient performed these at a higher level of independence PTA. Wife is unable to provide level of assist needed at present, making DC to home an unsafe DC options, but after STR stay to recover functional independence, return to home would make sense. Pt will benefit from skilled PT intervention to increase independence and safety with basic mobility in preparation for discharge to the venue listed below.       Follow Up  Recommendations SNF;Supervision for mobility/OOB    Equipment Recommendations  None recommended by PT    Recommendations for Other Services       Precautions / Restrictions Precautions Precautions: Fall Precaution Comments: enteric c active diarrhea Restrictions Weight Bearing Restrictions: No Other Position/Activity Restrictions: R side scalp dressing/stitches in place      Mobility  Bed Mobility Overal bed mobility: Needs Assistance Bed Mobility: Supine to Sit;Sit to Supine     Supine to sit: Min assist;HOB elevated Sit to supine: Min assist   General bed mobility comments: good effort, weak, needs assist with end of transitions    Transfers Overall transfer level: Needs assistance   Transfers: Sit to/from Stand Sit to Stand: Mod assist Stand pivot transfers: Min assist       General transfer comment: strength is fairly good, but technique is poro making ergonomics a challenge, could be more independent with better technique  Ambulation/Gait   Gait Distance (Feet): 32 Feet Assistive device: Rolling walker (2 wheeled) Gait Pattern/deviations: Step-to pattern Gait velocity: 0.80m/s   General Gait Details: slow and labored, but reports to feel ok. Much slower and weaker than his baseline. Struggles with tight turns in room.  Stairs            Wheelchair Mobility    Modified Rankin (Stroke Patients Only)       Balance Overall balance assessment: Needs assistance Sitting-balance support: No upper extremity supported Sitting balance-Leahy Scale: Normal Sitting balance - Comments: able to don socks from a high surface   Standing balance support: Single extremity supported;During functional activity Standing balance-Leahy Scale: Good Standing balance comment: able to stand for  1-2 minutes for pericare and diaper donning                             Pertinent Vitals/Pain Pain Assessment: No/denies pain Faces Pain Scale: Hurts little  more Pain Location: R UE/LE Pain Descriptors / Indicators: Tender Pain Intervention(s): Limited activity within patient's tolerance;Monitored during session    Home Living Family/patient expects to be discharged to:: Private residence Living Arrangements: Spouse/significant other Available Help at Discharge: Family;Available 24 hours/day Type of Home: House         Home Equipment: Walker - 2 wheels      Prior Function Level of Independence: Needs assistance   Gait / Transfers Assistance Needed: reports he has 2WW that he primarily uses, but spouse reports pt has been more confused about use of walker over 2-3 days.  ADL's / Homemaking Assistance Needed: Pt and spouse report that he is typically INDEP with BADLs with family assist for IADLs such as cooking/cleaning. But in recent days PTA, spouse reports pt has been more incontinent and needing more assist since R UE with decreased ROM since fall that prompted last admission.  Comments: at least one major fall this year with R UE/LE pain that prompted ED visit in May 2021     Hand Dominance   Dominant Hand: Right    Extremity/Trunk Assessment   Upper Extremity Assessment Upper Extremity Assessment: RUE deficits/detail;LUE deficits/detail RUE Deficits / Details: Shld ROM limited d/t pain, Elbow ~1/2 range, grip MMT grossly 3/5 LUE Deficits / Details: ROM WFL. MMT grossly 4-/5    Lower Extremity Assessment Lower Extremity Assessment: Defer to PT evaluation;Generalized weakness       Communication   Communication: Other (comment) (mostly discernable, but somewhat garbled speech)  Cognition Arousal/Alertness: Awake/alert Behavior During Therapy: WFL for tasks assessed/performed Overall Cognitive Status: History of cognitive impairments - at baseline                                 General Comments: Pt is oriented to place and some aspects of situation. Able to discern the year I'ly and Month with one cue.  Follows simple one step commands appropriately, but does demo need for increased processing time.      General Comments General comments (skin integrity, edema, etc.): HR bradycardic intermittently with fxl mobility/transfers. RN notified. Otherwise HR appropriate ~70s-80s    Exercises Other Exercises Other Exercises: OT facilitates ed re: role of OT, importance of OOB activity, potential need for rehabilitation/continued therapy outside of acute setting. Pt and spouse with good understanding.   Assessment/Plan    PT Assessment Patient needs continued PT services  PT Problem List Decreased strength;Decreased range of motion;Decreased activity tolerance;Decreased balance;Decreased mobility;Decreased coordination;Decreased knowledge of use of DME       PT Treatment Interventions DME instruction;Gait training;Stair training;Functional mobility training;Therapeutic activities;Therapeutic exercise;Balance training    PT Goals (Current goals can be found in the Care Plan section)  Acute Rehab PT Goals Patient Stated Goal: to go to rehab to get stronger PT Goal Formulation: With patient Time For Goal Achievement: 07/24/20 Potential to Achieve Goals: Fair    Frequency Min 2X/week   Barriers to discharge        Co-evaluation   Reason for Co-Treatment: Complexity of the patient's impairments (multi-system involvement);Necessary to address cognition/behavior during functional activity;To address functional/ADL transfers;For patient/therapist safety PT goals addressed during session: Mobility/safety  with mobility;Balance;Proper use of DME OT goals addressed during session: ADL's and self-care;Proper use of Adaptive equipment and DME       AM-PAC PT "6 Clicks" Mobility  Outcome Measure Help needed turning from your back to your side while in a flat bed without using bedrails?: A Little Help needed moving from lying on your back to sitting on the side of a flat bed without using  bedrails?: A Little Help needed moving to and from a bed to a chair (including a wheelchair)?: A Lot Help needed standing up from a chair using your arms (e.g., wheelchair or bedside chair)?: A Lot Help needed to walk in hospital room?: A Lot Help needed climbing 3-5 steps with a railing? : A Lot 6 Click Score: 14    End of Session Equipment Utilized During Treatment: Gait belt Activity Tolerance: Patient tolerated treatment well;No increased pain;Patient limited by lethargy;Patient limited by fatigue Patient left: in bed;with family/visitor present;with call bell/phone within reach;with nursing/sitter in room Nurse Communication: Mobility status PT Visit Diagnosis: Unsteadiness on feet (R26.81);Other abnormalities of gait and mobility (R26.89)    Time: 3668-1594 PT Time Calculation (min) (ACUTE ONLY): 24 min   Charges:   PT Evaluation $PT Eval Moderate Complexity: 1 Mod          3:26 PM, 07/10/20 Etta Grandchild, PT, DPT Physical Therapist - [facility]  [phone] ([place])    Cas Tracz C 07/10/2020, 3:23 PM

## 2020-07-10 NOTE — ED Notes (Addendum)
Visitor remains at bedside. Pt remains slightly altered from neuro baseline per family; tremor and slightly slurred speech remain. Plan for shift discussed. Dressing remains in place on pt's head.

## 2020-07-10 NOTE — ED Notes (Signed)
Attempted report; will call Mickle Plumb again in about 86mins.

## 2020-07-10 NOTE — H&P (Signed)
History and Physical    Wesley Anderson. QVZ:[id number] DOB: 02/24/1946 DOA: 07/09/2020  PCP: Adin Hector, MD   Patient coming from: Home  I have personally briefly reviewed patient's old medical records in [place]  Chief Complaint: Confusion, altered mental status  HPI: Wesley Anderson. is a 74 y.o. male with medical history significant for Parkinson's disease with electronic brain stimulator, cognitive impairment, with history of squamous cell carcinoma status post excision on 11/19 under general anesthesia, complicated by postoperative delirium, discharged from [facility] on 11/22, seen by his neurologist on 1122 with medication adjustment was brought into the emergency room for evaluation of altered mental status.  Most of the history is taken from wife who stated that on his discharge on 11/21, his confusion appeared to have improved however over the past 24 to 48 hours they have noted increasing confusion and ' talking out of his head'.  His wife states that he seems to be seeing things, was unable to recognize family members, he was stating that his family members will robots trying to give him injections as well as several other things of bizarre content.  She states that while hospitalized he was seen by a psychiatrist and started on Seroquel and after seeing his neurologist a few days after, he had his amantadine discontinued and his carbidopa dose increased and was also started on melatonin and Aricept.  His neurologist did blood work to include B12, and TSH both of which were normal on Care Everywhere.  Wife states she has been having difficulty giving him his medication including the antibiotics prescribed at discharge and his other meds.  He did complain of a bit of nausea but has had no abdominal pain, change in bowel habits, dysuria, vomiting.  He has had no complaints of cough, chest pain or shortness of breath and no fever or chills.   ED Course: On arrival, patient  was afebrile, heart rate 81, respiration 26 with O2 sat 94% on room air.  Blood pressure 105/62.  Blood work significant for WBC of 18,000, and creatinine of 1.84 up from baseline of 1.1 on Care Everywhere.  Bilirubin slightly elevated at 3.  Urinalysis without pyuria but with ketones.  Lactic acid 1.  Troponin 10 Chest x-ray with no acute findings CT head no acute intracranial findings EKG as reviewed by me : Normal sinus rhythm Patient was treated with an IV fluid bolus.  Hospitalist consulted for admission.  Review of Systems: Unreliable due to cognitive dysfunction   Past Medical History:  Diagnosis Date  . AAA (abdominal aortic aneurysm) ([place])   . Alcohol dependence in remission ([place])   . Cancer ([place])    skin   . Colon polyps   . ED (erectile dysfunction)   . GERD (gastroesophageal reflux disease)   . Hematest positive stools   . Melanoma of skin ([place])   . Parkinson's disease ([place])   . Renal cyst, right   . Restless leg syndrome     Past Surgical History:  Procedure Laterality Date  . CHOLECYSTECTOMY    . COLON SURGERY     colectomy with anastamosis  . COLONOSCOPY WITH PROPOFOL N/A 11/07/2016   Procedure: COLONOSCOPY WITH PROPOFOL;  Surgeon: Lollie Sails, MD;  Location: [facility] ENDOSCOPY;  Service: Endoscopy;  Laterality: N/A;  . COLONOSCOPY WITH PROPOFOL N/A 09/06/2018   Procedure: COLONOSCOPY WITH PROPOFOL;  Surgeon: Lollie Sails, MD;  Location: [facility] ENDOSCOPY;  Service: Endoscopy;  Laterality: N/A;  .  COLONOSCOPY WITH PROPOFOL N/A 05/16/2019   Procedure: COLONOSCOPY WITH PROPOFOL;  Surgeon: Lollie Sails, MD;  Location: [facility] ENDOSCOPY;  Service: Endoscopy;  Laterality: N/A;  . CYST EXCISION     vocal cord  . DBS placement    . deep brain implant    . ESOPHAGOGASTRODUODENOSCOPY (EGD) WITH PROPOFOL N/A 09/06/2018   Procedure: ESOPHAGOGASTRODUODENOSCOPY (EGD) WITH PROPOFOL;  Surgeon: Lollie Sails, MD;  Location: [facility] ENDOSCOPY;  Service: Endoscopy;   Laterality: N/A;  . MELANOMA EXCISION    . TONSILLECTOMY       reports that he has quit smoking. He has never used smokeless tobacco. He reports current alcohol use of about 6.0 standard drinks of alcohol per week. He reports previous drug use.  Allergies  Allergen Reactions  . Artane [Trihexyphenidyl] Anxiety    History reviewed. No pertinent family history.    Prior to Admission medications   Medication Sig Start Date End Date Taking? Authorizing Provider  amantadine (SYMMETREL) 100 MG capsule Take 100 mg by mouth 2 (two) times daily.    [provider]  Carbidopa-Levodopa ER (SINEMET CR) 25-100 MG tablet controlled release Take 1 tablet by mouth 2 (two) times daily.    [provider]  Multiple Vitamin (MULTIVITAMIN) tablet Take 1 tablet by mouth daily.    [provider]  pantoprazole (PROTONIX) 40 MG tablet Take 40 mg by mouth daily.    [provider]  rOPINIRole (REQUIP XL) 8 MG 24 hr tablet Take 8 mg by mouth at bedtime.    [provider]  sildenafil (VIAGRA) 100 MG tablet Take 100 mg by mouth daily as needed for erectile dysfunction.    [provider]  urea (CARMOL) 40 % CREA Apply topically daily.    [provider]    Physical Exam: Vitals:   07/09/20 2140 07/09/20 2230 07/09/20 2300 07/09/20 2330  BP: 105/62 100/67 103/64 95/64  Pulse: 81 76 72 69  Resp: (!) 26 (!) 21  17  Temp: (!) 97.4 F (36.3 C)     TempSrc: Oral     SpO2: 94% 96% 95% 93%  Weight:      Height:         Vitals:   07/09/20 2140 07/09/20 2230 07/09/20 2300 07/09/20 2330  BP: 105/62 100/67 103/64 95/64  Pulse: 81 76 72 69  Resp: (!) 26 (!) 21  17  Temp: (!) 97.4 F (36.3 C)     TempSrc: Oral     SpO2: 94% 96% 95% 93%  Weight:      Height:          Constitutional: Alert and oriented x 1 .  Patient appeared restless and anxious, saying he wants to ' get out of here' HEENT:      Head:  Head wrapped in gauze bandages        Eyes: PERLA, EOMI, Conjunctivae are normal. Sclera is non-icteric.       Mouth/Throat: Mucous membranes are moist.       Neck: Supple with no signs of meningismus. Cardiovascular: Regular rate and rhythm. No murmurs, gallops, or rubs. 2+ symmetrical distal pulses are present . No JVD. No LE edema Respiratory: Respiratory effort normal .Lungs sounds clear bilaterally. No wheezes, crackles, or rhonchi.  Gastrointestinal: Soft, non tender, and non distended with positive bowel sounds. No rebound or guarding. Genitourinary: No CVA tenderness. Musculoskeletal: Nontender with normal range of motion in all extremities. No cyanosis, or erythema of extremities. Neurologic:  Face is  symmetric. Moving all extremities. No gross focal neurologic deficits . Skin: Skin is warm, dry.  No rash or ulcers Psychiatric: Anxious, restless  Labs on Admission: I have personally reviewed following labs and imaging studies  CBC: Recent Labs  Lab 07/09/20 2140  WBC 18.1*  HGB 15.3  HCT 46.0  MCV 93.1  PLT 759   Basic Metabolic Panel: Recent Labs  Lab 07/09/20 2140  NA 139  K 3.5  CL 105  CO2 22  GLUCOSE 135*  BUN 36*  CREATININE 1.84*  CALCIUM 9.7   GFR: Estimated Creatinine Clearance: 36.3 mL/min (A) (by C-G formula based on SCr of 1.84 mg/dL (H)). Liver Function Tests: Recent Labs  Lab 07/09/20 2140  AST 28  ALT 29  ALKPHOS 80  BILITOT 3.0*  PROT 8.1  ALBUMIN 4.4   No results for input(s): LIPASE, AMYLASE in the last 168 hours. No results for input(s): AMMONIA in the last 168 hours. Coagulation Profile: No results for input(s): INR, PROTIME in the last 168 hours. Cardiac Enzymes: No results for input(s): CKTOTAL, CKMB, CKMBINDEX, TROPONINI in the last 168 hours. BNP (last 3 results) No results for input(s): PROBNP in the last 8760 hours. HbA1C: No results for input(s): HGBA1C in the last 72 hours. CBG: No results for input(s): GLUCAP in the last 168 hours. Lipid Profile: No  results for input(s): CHOL, HDL, LDLCALC, TRIG, CHOLHDL, LDLDIRECT in the last 72 hours. Thyroid Function Tests: No results for input(s): TSH, T4TOTAL, FREET4, T3FREE, THYROIDAB in the last 72 hours. Anemia Panel: No results for input(s): VITAMINB12, FOLATE, FERRITIN, TIBC, IRON, RETICCTPCT in the last 72 hours. Urine analysis:    Component Value Date/Time   COLORURINE AMBER (A) 07/09/2020 2251   APPEARANCEUR HAZY (A) 07/09/2020 2251   LABSPEC 1.025 07/09/2020 2251   PHURINE 5.0 07/09/2020 2251   GLUCOSEU NEGATIVE 07/09/2020 2251   HGBUR NEGATIVE 07/09/2020 2251   BILIRUBINUR NEGATIVE 07/09/2020 2251   KETONESUR 5 (A) 07/09/2020 2251   PROTEINUR 30 (A) 07/09/2020 2251   NITRITE NEGATIVE 07/09/2020 2251   LEUKOCYTESUR TRACE (A) 07/09/2020 2251    Radiological Exams on Admission: CT Head Wo Contrast  Result Date: 07/09/2020 CLINICAL DATA:  Mental status change, skin cancer lesion removed from head EXAM: CT HEAD WITHOUT CONTRAST TECHNIQUE: Contiguous axial images were obtained from the base of the skull through the vertex without intravenous contrast. COMPARISON:  CT brain 12/20/2019 FINDINGS: Brain: No acute territorial infarction, hemorrhage, or intracranial mass. Mild atrophy. Mild hypodensity in the white matter presumably chronic small vessel ischemic change. Stable ventricle size. Right transfrontal deep brain stimulator lead. Vascular: No hyperdense vessels.  Carotid vascular calcification. Skull: Right frontal burr hole.  No fracture Sinuses/Orbits: No acute finding. Other: Scalp defect with packing material at the cranial vertex anteriorly presumably corresponding to history of skin lesion removal IMPRESSION: 1. No CT evidence for acute intracranial abnormality. 2. Atrophy and mild chronic small vessel ischemic changes of the white matter. Electronically Signed   By: Donavan Foil M.D.   On: 07/09/2020 22:46   DG Chest Portable 1 View  Result Date: 07/09/2020 CLINICAL DATA:   Weakness EXAM: PORTABLE CHEST 1 VIEW COMPARISON:  None. FINDINGS: A stimulator pack is noted projecting over the right chest wall. The heart size is mildly enlarged. Aortic calcifications are noted. There is no pneumothorax. No large pleural effusion. Streaky bibasilar airspace opacities are noted favored to represent areas of atelectasis and scarring. IMPRESSION: 1. No acute cardiopulmonary process. 2. Bibasilar atelectasis and scarring.  3. Mild cardiomegaly. 4. Aortic calcifications. Electronically Signed   By: Constance Holster M.D.   On: 07/09/2020 22:12     Assessment/Plan 74 year old male with history of Parkinson's disease with electronic brain stimulator, cognitive impairment, with history of squamous cell carcinoma status post excision on 11/19 under general anesthesia, complicated by postoperative delirium, discharged from [facility] on 11/21, seen by his neurologist on 11/22 with medication adjustment was presenting with evaluation of altered mental status.      Acute metabolic encephalopathy ?  Delirium superimposed on cognitive impairment -Patient presenting with acute change in behavior over the past few days far from baseline for his cognitive impairment, requiring increased supervision -Patient was treated at [facility] for suspected delirium precipitated by anesthesia.  Was evaluated by psychiatry -Possible acute metabolic encephalopathy related to acute kidney injury.  No stigmata of infection at this time except for elevated WBC of 18,000 -IV hydration -Continue Seroquel, Aricept, melatonin -Tele sitter if needed with chemical sedation if needed    AKI (acute kidney injury) ([place]) -Patient given IV fluid bolus in the emergency room -Monitor renal function and avoid nephrotoxins    Leukocytosis, in the setting of recent skin excision with skin graft -Possibly related to recent surgery, requesting infection -Patient was prescribed Keflex for 11/22 for 7 days -Infection not ruled out -Given  patient was refusing medication at home, will give Rocephin to replace Keflex x1 to 2 days    Parkinson's disease ([place])   S/P deep brain stimulator placement -Medication was adjusted by neurologist during visit on 11/22 -Continue Sinemet, 1.5 tabs 3 times daily and ropinirole 8 mg nightly.  Amantadine was discontinued    DVT prophylaxis: Lovenox  Code Status: full code  Family Communication: Wife Disposition Plan: Back to previous home environment Consults called: none  Status: Observation    Athena Masse MD [facility]     07/10/2020, 12:15 AM

## 2020-07-10 NOTE — Evaluation (Signed)
Occupational Therapy Evaluation Patient Details Name: Wesley Anderson. MRN: [id number] DOB: 1946/02/08 Today's Date: 07/10/2020    History of Present Illness "Wesley" Anderson is a 74 y/o M with PMH: parkinson's s/p deep brain stimulator placement, Squamous cell carcinoma- 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 under general anesthesia, complicated by postoperative delirium. Pt was d/c'ed from [facility], seen by his neurologist on 11/22 with medication adjustment. Pt then brought into the [facility] ED for evaluation of altered mental status on 11/25. Pt adm for w/u of acute metabolic encephalopathy-delirium superimposed on cognitive impairment, potentially 2/2 AKI.   Clinical Impression   Pt seen for OT evaluation this date in setting of acute hospitalization with AMS. Pt and spouse report that he is typically able to perform BADLs at home and fxl mobility with 2WW, but requires some family assist for IADLs at baseline including cooking/cleaning. Pt presents this date moderately oriented (see cognition section below). He is able to follow simple one step commands with increased processing time throughout assessment. Pt seen by PT and OT simultaneously for safety and to maximize his performance on evaluation d/t his current activity tolerance. Pt requires MIN A for bed mobility, MOD A with arm in arm for ADL transfers MIN A/CGA for fxl mobility with RW and second person for equipment and safety cueing as pt demos decreased safety awareness with hand/foot placement relative to transfers and fxl mobility with 2WW (wife endorses that he's been more confused about this over ~2-3 days despite typically using with MOD Indep-ex: she reports he attempted to turn it upside down and use it at home). Pt demos decreased standing balance and tolerance (~3 mins to complete mobility to door/back). Currently, he is also requiring more assist with basic self care including: MOD A with seated LB dressing, MOD A +a for sit<>stand  for toileting/peri care/changing brief (partially d/t limited R UE ROM to reach perineal area). Pt able to perform seated UB g/h with SETUP, requires MIN A for seated UB dressing. Will continue to follow. Anticipate he will require d/c to STR after hospitalization as he is performing mobility and self care below his baseline.    Follow Up Recommendations  SNF    Equipment Recommendations  Other (comment) (defer to next level of care, anticiapte he will need [facility] for home)    Recommendations for Other Services       Precautions / Restrictions Precautions Precautions: Fall Precaution Comments: enteric c active diarrhea Restrictions Weight Bearing Restrictions: No Other Position/Activity Restrictions: R side scalp dressing/stitches in place      Mobility Bed Mobility Overal bed mobility: Needs Assistance Bed Mobility: Supine to Sit;Sit to Supine     Supine to sit: Min assist;HOB elevated Sit to supine: Min assist        Transfers Overall transfer level: Needs assistance   Transfers: Sit to/from [employer] Transfers Sit to Stand: Mod assist;+2 safety/equipment Stand pivot transfers: Mod assist;+2 safety/equipment       General transfer comment: arm in arm technique from PT, OT facilitates cues for safe hand placement/safety awareness such as backing up until he feels seat. As well as assist for line/lead mgt.    Balance Overall balance assessment: Needs assistance Sitting-balance support: Single extremity supported;Feet unsupported Sitting balance-Leahy Scale: Fair Sitting balance - Comments: UE support, feet unsupported d/t stretcher height. F static sit, P dyanmic sit such as while performing LB ADLs.     Standing balance-Leahy Scale: Fair Standing balance comment: F static stand with RW/B UE  support and CGA/MIN A. P dynamic stand/P balance with fxl mobility requiring external support for safety.                           ADL either performed or  assessed with clinical judgement   ADL                                         General ADL Comments: Pt requires MOD A with seated LB dressing, MOD A +a for sit<>stand for toileting/peri care/changing brief (partially d/t limited R UE ROM to reach perineal area). Pt able to perform seated UB g/h with SETUP, requires MIN A for seated UB dressing. MIN A with RW for fxl mobility to door/back, with 1 standing rest break.     Vision Patient Visual Report: No change from baseline Additional Comments: difficulty opening R eyelid fully since sx to R scalp     Perception     Praxis      Pertinent Vitals/Pain Pain Assessment: Faces Faces Pain Scale: Hurts little more Pain Location: R UE/LE Pain Descriptors / Indicators: Tender Pain Intervention(s): Limited activity within patient's tolerance;Monitored during session     Hand Dominance Right (difficulty using R UE recently, keeps elbow in flexed position.)   Extremity/Trunk Assessment Upper Extremity Assessment Upper Extremity Assessment: RUE deficits/detail;LUE deficits/detail RUE Deficits / Details: Shld ROM limited d/t pain, Elbow ~1/2 range, grip MMT grossly 3/5 LUE Deficits / Details: ROM WFL. MMT grossly 4-/5   Lower Extremity Assessment Lower Extremity Assessment: Defer to PT evaluation;Generalized weakness       Communication Communication Communication: Other (comment) (mostly discernable, but somewhat garbled speech)   Cognition Arousal/Alertness: Awake/alert Behavior During Therapy: WFL for tasks assessed/performed Overall Cognitive Status: History of cognitive impairments - at baseline                                 General Comments: Pt is oriented to place and some aspects of situation. Able to discern the year I'ly and Month with one cue. Follows simple one step commands appropriately, but does demo need for increased processing time.   General Comments  HR bradycardic intermittently  with fxl mobility/transfers. RN notified. Otherwise HR appropriate ~70s-80s    Exercises Other Exercises Other Exercises: OT facilitates ed re: role of OT, importance of OOB activity, potential need for rehabilitation/continued therapy outside of acute setting. Pt and spouse with good understanding.   Shoulder Instructions      Home Living Family/patient expects to be discharged to:: Private residence Living Arrangements: Spouse/significant other Available Help at Discharge: Family;Available 24 hours/day Type of Home: House                       Home Equipment: Walker - 2 wheels          Prior Functioning/Environment Level of Independence: Needs assistance  Gait / Transfers Assistance Needed: reports he has 2WW that he primarily uses, but spouse reports pt has been more confused about use of walker over 2-3 days. ADL's / Homemaking Assistance Needed: Pt and spouse report that he is typically INDEP with BADLs with family assist for IADLs such as cooking/cleaning. But in recent days PTA, spouse reports pt has been more incontinent and needing more assist since R UE  with decreased ROM since fall that prompted last admission.   Comments: at least one major fall this year with R UE/LE pain that prompted ED visit in May 2021        OT Problem List: Decreased strength;Decreased range of motion;Decreased activity tolerance;Impaired balance (sitting and/or standing);Decreased coordination;Decreased cognition;Decreased safety awareness;Decreased knowledge of use of DME or AE;Cardiopulmonary status limiting activity;Pain      OT Treatment/Interventions: Self-care/ADL training;DME and/or AE instruction;Therapeutic activities;Balance training;Therapeutic exercise;Neuromuscular education;Energy conservation;Patient/family education    OT Goals(Current goals can be found in the care plan section) Acute Rehab OT Goals Patient Stated Goal: to go to rehab to get stronger OT Goal  Formulation: With patient/family Time For Goal Achievement: 07/24/20 Potential to Achieve Goals: Good ADL Goals Pt Will Perform Lower Body Dressing: with supervision;sit to/from stand (with AD/LRAD PRN) Pt Will Transfer to Toilet: with min guard assist;ambulating (with LRAD to restroom with good safety awareness and at least attn to continence/needs) Pt Will Perform Toileting - Clothing Manipulation and hygiene: with min guard assist;sit to/from stand Pt Will Perform Tub/Shower Transfer: with min guard assist Pt/caregiver will Perform Home Exercise Program: Increased ROM;Right Upper extremity;Increased strength;Left upper extremity (mindful of R shld pain)  OT Frequency: Min 1X/week   Barriers to D/C: Decreased caregiver support  pt has supportive spouse, but she endorses not being able to help him physically on the level he needs.       Co-evaluation PT/OT/SLP Co-Evaluation/Treatment: Yes Reason for Co-Treatment: Complexity of the patient's impairments (multi-system involvement);Necessary to address cognition/behavior during functional activity;To address functional/ADL transfers;For patient/therapist safety PT goals addressed during session: Mobility/safety with mobility;Balance;Proper use of DME OT goals addressed during session: ADL's and self-care;Proper use of Adaptive equipment and DME      AM-PAC OT "6 Clicks" Daily Activity     Outcome Measure Help from another person eating meals?: None Help from another person taking care of personal grooming?: A Little Help from another person toileting, which includes using toliet, bedpan, or urinal?: Total Help from another person bathing (including washing, rinsing, drying)?: A Lot Help from another person to put on and taking off regular upper body clothing?: A Little Help from another person to put on and taking off regular lower body clothing?: A Lot 6 Click Score: 15   End of Session Equipment Utilized During Treatment: Gait  belt;Rolling walker Nurse Communication: Mobility status;Other (comment) (notified of HR)  Activity Tolerance: Patient tolerated treatment well;Treatment limited secondary to medical complications (Comment) (fxl mobility limited by therpist's discression for pt safety d/t low HR) Patient left: in bed;with call bell/phone within reach;with family/visitor present (pt's spouse present)  OT Visit Diagnosis: Unsteadiness on feet (R26.81);Muscle weakness (generalized) (M62.81);History of falling (Z91.81)                Time: 4496-7591 OT Time Calculation (min): 33 min Charges:  OT General Charges $OT Visit: 1 Visit OT Evaluation $OT Eval Moderate Complexity: 1 Mod OT Treatments $Self Care/Home Management : 8-22 mins  Gerrianne Scale, MS, OTR/L ascom [phone] 07/10/20, 3:17 PM

## 2020-07-11 DIAGNOSIS — G9341 Metabolic encephalopathy: Secondary | ICD-10-CM | POA: Diagnosis not present

## 2020-07-11 LAB — CBC WITH DIFFERENTIAL/PLATELET
Abs Immature Granulocytes: 0.03 10*3/uL (ref 0.00–0.07)
Basophils Absolute: 0 10*3/uL (ref 0.0–0.1)
Basophils Relative: 0 %
Eosinophils Absolute: 0.1 10*3/uL (ref 0.0–0.5)
Eosinophils Relative: 2 %
HCT: 36.9 % — ABNORMAL LOW (ref 39.0–52.0)
Hemoglobin: 12.5 g/dL — ABNORMAL LOW (ref 13.0–17.0)
Immature Granulocytes: 0 %
Lymphocytes Relative: 14 %
Lymphs Abs: 1.1 10*3/uL (ref 0.7–4.0)
MCH: 31.1 pg (ref 26.0–34.0)
MCHC: 33.9 g/dL (ref 30.0–36.0)
MCV: 91.8 fL (ref 80.0–100.0)
Monocytes Absolute: 0.8 10*3/uL (ref 0.1–1.0)
Monocytes Relative: 10 %
Neutro Abs: 5.9 10*3/uL (ref 1.7–7.7)
Neutrophils Relative %: 74 %
Platelets: 183 10*3/uL (ref 150–400)
RBC: 4.02 MIL/uL — ABNORMAL LOW (ref 4.22–5.81)
RDW: 13.6 % (ref 11.5–15.5)
WBC: 8 10*3/uL (ref 4.0–10.5)
nRBC: 0 % (ref 0.0–0.2)

## 2020-07-11 LAB — BASIC METABOLIC PANEL
Anion gap: 7 (ref 5–15)
BUN: 23 mg/dL (ref 8–23)
CO2: 20 mmol/L — ABNORMAL LOW (ref 22–32)
Calcium: 8.7 mg/dL — ABNORMAL LOW (ref 8.9–10.3)
Chloride: 111 mmol/L (ref 98–111)
Creatinine, Ser: 0.94 mg/dL (ref 0.61–1.24)
GFR, Estimated: >60 mL/min (ref >60)
Glucose, Bld: 100 mg/dL — ABNORMAL HIGH (ref 70–99)
Potassium: 3.4 mmol/L — ABNORMAL LOW (ref 3.5–5.1)
Sodium: 138 mmol/L (ref 135–145)

## 2020-07-11 MED ORDER — POTASSIUM CHLORIDE CRYS ER 20 MEQ PO TBCR
40.0000 meq | EXTENDED_RELEASE_TABLET | Freq: Once | ORAL | Status: AC
  Administered 2020-07-11: 10:00:00 40 meq via ORAL
  Filled 2020-07-11: qty 2

## 2020-07-11 NOTE — TOC Initial Note (Signed)
Transition of Care (TOC) - Initial/Assessment Note    Patient Details  Name: Wesley Anderson. MRN: [id number] Date of Birth: 10/30/1945  [facility]) CM/SW Contact:    Meriel Flavors, LCSW Phone Number: 07/11/2020, 10:35 AM  Clinical Narrative:                 Weekend CSW received message to call patient's son, Wesley Anderson to discuss discharge plan. CSW spoke with Wesley Anderson, stated his dad wants to go today! They are willing to to private pay until insurance Josem Kaufmann is approved. CSW explained the SNF placement process, answered questions and confirmed CSW will contact the family as soon as any bed offers are available.        Patient Goals and CMS Choice        Expected Discharge Plan and Services                                                Prior Living Arrangements/Services                       Activities of Daily Living Home Assistive Devices/Equipment: Gilford Rile (specify type) ADL Screening (condition at time of admission) Patient's cognitive ability adequate to safely complete daily activities?: Yes Is the patient deaf or have difficulty hearing?: No Does the patient have difficulty seeing, even when wearing glasses/contacts?: No Does the patient have difficulty concentrating, remembering, or making decisions?: No Patient able to express need for assistance with ADLs?: Yes Does the patient have difficulty dressing or bathing?: Yes Independently performs ADLs?: No Communication: Needs assistance Is this a change from baseline?: Pre-admission baseline Dressing (OT): Needs assistance Is this a change from baseline?: Pre-admission baseline Grooming: Needs assistance Is this a change from baseline?: Pre-admission baseline Feeding: Independent Bathing: Needs assistance Is this a change from baseline?: Pre-admission baseline Toileting: Needs assistance Is this a change from baseline?: Pre-admission baseline In/Out Bed: Needs  assistance Is this a change from baseline?: Pre-admission baseline Walks in Home: Needs assistance Is this a change from baseline?: Pre-admission baseline Does the patient have difficulty walking or climbing stairs?: Yes Weakness of Legs: None Weakness of Arms/Hands: Right  Permission Sought/Granted                  Emotional Assessment              Admission diagnosis:  Acute encephalopathy [G93.40] AKI (acute kidney injury) ([place]) [N17.9] Altered mental status, unspecified altered mental status type [O12.24] Acute metabolic encephalopathy [M25.00] Patient Active Problem List   Diagnosis Date Noted  . Acute metabolic encephalopathy 37/11/8887  . AKI (acute kidney injury) ([place]) 07/09/2020  . Leukocytosis 07/09/2020  . S/P deep brain stimulator placement 07/09/2020  . Squamous cell carcinoma, 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 04/24/2020  . Cognitive impairment 02/22/2013  . Parkinson's disease ([place]) 09/28/2011   PCP:  Adin Hector, MD Pharmacy:   [id number] - [place], [place] [address] [place] [address] Phone: [phone] Fax: [phone]     Social Determinants of Health (SDOH) Interventions    Readmission Risk Interventions No flowsheet data found.

## 2020-07-11 NOTE — Progress Notes (Signed)
PROGRESS NOTE    Wesley Anderson.  ENI:[id number] DOB: 07-24-1946 DOA: 07/09/2020 PCP: Adin Hector, MD   Brief Narrative:  Wesley Anderson. is a 74 y.o. 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  According to his wife, his confusion appeared to have improved however over the past 24 to 48 hours they have noted increasing confusion and ' talking out of his head'.  His wife states that he seems to be seeing things, was unable to recognize family members, he was stating that his family members will robots trying to give him injections as well as several other things of bizarre content.  She states that while hospitalized he was seen by a psychiatrist and started on Seroquel and after seeing his neurologist a few days after, he had his amantadine discontinued and his carbidopa dose increased and was also started on melatonin and Aricept.  His neurologist did blood work to include B12, and TSH both of which were normal on Care Everywhere.  Wife states she has been having difficulty giving him his medication including the antibiotics prescribed at discharge and his other meds.  No other complaint  On arrival, patient was afebrile, heart rate 81, respiration 26 with O2 sat 94% on room air.  Blood pressure 105/62.  Blood work significant for WBC of 18,000, and creatinine of 1.84 up from baseline of 1.1 on Care Everywhere.  Bilirubin slightly elevated at 3.  Urinalysis without pyuria but with ketones.  Lactic acid 1.  Troponin 10 Chest x-ray with no acute findings CT head no acute intracranial findings EKG as  reviewed by me : Normal sinus rhythm Patient was treated with an IV fluid bolus.    Admitted under hospital service due to acute encephalopathy.   Assessment & Plan:   Principal Problem:   Acute metabolic encephalopathy Active Problems:   Parkinson's disease ([place])   Cognitive impairment   AKI (acute kidney injury) ([place])   Leukocytosis   S/P deep brain stimulator placement   Squamous cell carcinoma, scalp/neck s/p excision on 61/44/31   Acute metabolic encephalopathy vs delirium vs dehydration: Patient seem to have combination of underlying causes for his encephalopathy which may include acute toxic encephalopathy due to possible infection at the scalp incision, delirium and  dehydration.  Daughter at the bedside.  Patient is much more alert compared to yesterday.  He is oriented x3 as well.  Per daughter, he is much improved but not back to baseline however they want him to go to SNF today even if they have to privately pay.  However, per case manager, there is no bed available for him.  Continue current antibiotics.  Wound care has seen this patient as well.  Leukocytosis resolved.  His wound looks much better today with no discharge.      AKI/dehydration: Resolved but clinically he still looks slightly dehydrated.  Continue IV fluids.  Hypokalemia: 3.4.  Replace.  Recheck in the morning.  Diarrhea: Patient started having some diarrhea yesterday.  He was tested negative for C. difficile and GI pathogen panel was negative as well.  Is improving.    Parkinson's disease ([place])   S/P deep brain  stimulator placement -Medication was adjusted by neurologist during visit on 11/22 -Continue Sinemet, 1.5 tabs 3 times daily and ropinirole 8 mg nightly.  Amantadine was discontinued  DVT prophylaxis:    Code Status: Full Code  Family Communication: Daughter present at bedside.  Plan of care discussed with patient and the daughter at the bedside.  Status is: Observation  The patient will  require care spanning > 2 midnights and should be moved to inpatient because: Inpatient level of care appropriate due to severity of illness  Dispo: The patient is from: Home              Anticipated d/c is to: SNF              Anticipated d/c date is: 1 day              Patient currently is medically stable to d/c.        Estimated body mass index is 26.7 kg/m as calculated from the following:   Height as of this encounter: 5\' 9"  (1.753 m).   Weight as of this encounter: 82 kg.      Nutritional status:               Consultants:   None  Procedures:   None  Antimicrobials:  Anti-infectives (From admission, onward)   Start     Dose/Rate Route Frequency Ordered Stop   07/10/20 0030  cefTRIAXone (ROCEPHIN) 1 g in sodium chloride 0.9 % 100 mL IVPB        1 g 200 mL/hr over 30 Minutes Intravenous Daily-1800 07/10/20 0014           Subjective: Seen and examined.  Daughter at the bedside.  Patient much more alert than yesterday however he is partially oriented.  Denied having any complaint.  Objective: Vitals:   07/10/20 2336 07/11/20 0329 07/11/20 0745 07/11/20 1113  BP: 115/73 119/74 134/73 117/77  Pulse: 70 75 69 67  Resp: 18  20 20   Temp: 98.4 F (36.9 C) 98.3 F (36.8 C) 98.4 F (36.9 C) 98.1 F (36.7 C)  TempSrc: Oral  Oral Oral  SpO2: 96% 96% 94% 98%  Weight:      Height:        Intake/Output Summary (Last 24 hours) at 07/11/2020 1146 Last data filed at 07/11/2020 0500 Gross per 24 hour  Intake 760.48 ml  Output 450 ml  Net 310.48 ml   Filed Weights   07/09/20 2136  Weight: 82 kg    Examination:  General exam: Appears calm and comfortable  Respiratory system: Clear to auscultation. Respiratory effort normal. Cardiovascular system: S1 & S2 heard, RRR. No JVD, murmurs, rubs, gallops or clicks. No pedal edema. Gastrointestinal system: Abdomen is nondistended, soft and nontender. No organomegaly or masses felt. Normal bowel sounds  heard. Central nervous system: Alert and oriented. No focal neurological deficits. Extremities: Symmetric 5 x 5 power. Skin: Suture and stitches on the scalp.  As pictured above. Psychiatry: Judgement and insight appear poor. Mood & affect appropriate.   Data Reviewed: I have personally reviewed following labs and imaging studies  CBC: Recent Labs  Lab 07/09/20 2140 07/10/20 0811 07/11/20 0512  WBC 18.1* 11.2* 8.0  NEUTROABS  --  9.1* 5.9  HGB 15.3 14.0 12.5*  HCT 46.0 41.5 36.9*  MCV 93.1 91.2 91.8  PLT 218 194 588   Basic Metabolic Panel: Recent Labs  Lab 07/09/20 2140 07/10/20 0811 07/11/20 0512  NA 139 138 138  K 3.5 3.6 3.4*  CL 105 108 111  CO2 22 21* 20*  GLUCOSE 135* 127* 100*  BUN 36* 30* 23  CREATININE 1.84* 1.27* 0.94  CALCIUM 9.7 9.3 8.7*  MG  --  2.1  --    GFR: Estimated Creatinine Clearance: 71 mL/min (by C-G formula based on SCr of 0.94 mg/dL). Liver Function Tests: Recent Labs  Lab 07/09/20 2140 07/10/20 0811  AST 28 20  ALT 29 29  ALKPHOS 80 69  BILITOT 3.0* 2.8*  PROT 8.1 6.8  ALBUMIN 4.4 3.5   No results for input(s): LIPASE, AMYLASE in the last 168 hours. Recent Labs  Lab 07/10/20 0811  AMMONIA 18   Coagulation Profile: No results for input(s): INR, PROTIME in the last 168 hours. Cardiac Enzymes: No results for input(s): CKTOTAL, CKMB, CKMBINDEX, TROPONINI in the last 168 hours. BNP (last 3 results) No results for input(s): PROBNP in the last 8760 hours. HbA1C: No results for input(s): HGBA1C in the last 72 hours. CBG: No results for input(s): GLUCAP in the last 168 hours. Lipid Profile: No results for input(s): CHOL, HDL, LDLCALC, TRIG, CHOLHDL, LDLDIRECT in the last 72 hours. Thyroid Function Tests: No results for input(s): TSH, T4TOTAL, FREET4, T3FREE, THYROIDAB in the last 72 hours. Anemia Panel: No results for input(s): VITAMINB12, FOLATE, FERRITIN, TIBC, IRON, RETICCTPCT in the last 72 hours. Sepsis Labs: Recent Labs   Lab 07/09/20 2251 07/10/20 0119  LATICACIDVEN 1.0 1.4    Recent Results (from the past 240 hour(s))  Resp Panel by RT-PCR (Flu A&B, Covid) Nasopharyngeal Swab     Status: None   Collection Time: 07/09/20 10:51 PM   Specimen: Nasopharyngeal Swab; Nasopharyngeal(NP) swabs in vial transport medium  Result Value Ref Range Status   SARS Coronavirus 2 by RT PCR NEGATIVE NEGATIVE Final    Comment: (NOTE) SARS-CoV-2 target nucleic acids are NOT DETECTED.  The SARS-CoV-2 RNA is generally detectable in upper respiratory specimens during the acute phase of infection. The lowest concentration of SARS-CoV-2 viral copies this assay can detect is 138 copies/mL. A negative result does not preclude SARS-Cov-2 infection and should not be used as the sole basis for treatment or other patient management decisions. A negative result may occur with  improper specimen collection/handling, submission of specimen other than nasopharyngeal swab, presence of viral mutation(s) within the areas targeted by this assay, and inadequate number of viral copies(<138 copies/mL). A negative result must be combined with clinical observations, patient history, and epidemiological information. The expected result is Negative.  Fact Sheet for Patients:  EntrepreneurPulse.com.au  Fact Sheet for Healthcare Providers:  IncredibleEmployment.be  This test is no t yet approved or cleared by the [place] FDA and  has been authorized for detection and/or diagnosis of SARS-CoV-2 by FDA under an Emergency Use Authorization (EUA). This EUA will remain  in effect (meaning this test can be used) for the duration of the COVID-19 declaration under Section 564(b)(1) of the Act, 21 U.S.C.section 360bbb-3(b)(1), unless the authorization is terminated  or revoked sooner.       Influenza A by PCR NEGATIVE NEGATIVE Final   Influenza B by PCR NEGATIVE NEGATIVE Final    Comment: (NOTE) The  Xpert Xpress SARS-CoV-2/FLU/RSV plus assay is intended as an aid in the diagnosis of influenza from Nasopharyngeal swab specimens and should not be used as a sole basis for treatment. Nasal washings and aspirates are unacceptable for Xpert Xpress SARS-CoV-2/FLU/RSV testing.  Fact Sheet for Patients: EntrepreneurPulse.com.au  Fact Sheet for Healthcare Providers:  IncredibleEmployment.be  This test is not yet approved or cleared by the [place] and has been authorized for detection and/or diagnosis of SARS-CoV-2 by FDA under an Emergency Use Authorization (EUA). This EUA will remain in effect (meaning this test can be used) for the duration of the COVID-19 declaration under Section 564(b)(1) of the Act, 21 U.S.C. section 360bbb-3(b)(1), unless the authorization is terminated or revoked.  Performed at [facility], [address] [place], [place] [address]   C Difficile Quick Screen w PCR reflex     Status: None   Collection Time: 07/10/20  1:01 PM   Specimen: STOOL  Result Value Ref Range Status   C Diff antigen NEGATIVE NEGATIVE Final   C Diff toxin NEGATIVE NEGATIVE Final   C Diff interpretation No C. difficile detected.  Final    Comment: Performed at [facility], [address] [place], [place] [address]  Gastrointestinal Panel by PCR , Stool     Status: None   Collection Time: 07/10/20  1:01 PM   Specimen: STOOL  Result Value Ref Range Status   Campylobacter species NOT DETECTED NOT DETECTED Final   Plesimonas shigelloides NOT DETECTED NOT DETECTED Final   Salmonella species NOT DETECTED NOT DETECTED Final   Yersinia enterocolitica NOT DETECTED NOT DETECTED Final   Vibrio species NOT DETECTED NOT DETECTED Final   Vibrio cholerae NOT DETECTED NOT DETECTED Final   Enteroaggregative E coli (EAEC) NOT DETECTED NOT DETECTED Final   Enteropathogenic E coli (EPEC) NOT DETECTED NOT DETECTED Final   Enterotoxigenic  E coli (ETEC) NOT DETECTED NOT DETECTED Final   Shiga like toxin producing E coli (STEC) NOT DETECTED NOT DETECTED Final   Shigella/Enteroinvasive E coli (EIEC) NOT DETECTED NOT DETECTED Final   Cryptosporidium NOT DETECTED NOT DETECTED Final   Cyclospora cayetanensis NOT DETECTED NOT DETECTED Final   Entamoeba histolytica NOT DETECTED NOT DETECTED Final   Giardia lamblia NOT DETECTED NOT DETECTED Final   Adenovirus F40/41 NOT DETECTED NOT DETECTED Final   Astrovirus NOT DETECTED NOT DETECTED Final   Norovirus GI/GII NOT DETECTED NOT DETECTED Final   Rotavirus A NOT DETECTED NOT DETECTED Final   Sapovirus (I, II, IV, and V) NOT DETECTED NOT DETECTED Final    Comment: Performed at [facility], [address], [place], [place] [address]      Radiology Studies: CT Head Wo Contrast  Result Date: 07/09/2020 CLINICAL DATA:  Mental status change, skin cancer lesion removed from head EXAM: CT HEAD WITHOUT CONTRAST TECHNIQUE: Contiguous axial images were obtained from the base of the skull through the vertex without intravenous contrast. COMPARISON:  CT brain 12/20/2019 FINDINGS: Brain: No acute territorial infarction, hemorrhage, or intracranial mass. Mild atrophy. Mild hypodensity in the white matter presumably chronic small vessel ischemic change. Stable ventricle size. Right transfrontal deep brain stimulator lead. Vascular: No hyperdense vessels.  Carotid vascular calcification. Skull: Right frontal burr hole.  No fracture Sinuses/Orbits: No acute finding. Other: Scalp defect with packing material at the cranial vertex anteriorly presumably corresponding to history of skin lesion removal IMPRESSION: 1. No CT evidence for acute intracranial abnormality. 2. Atrophy and mild chronic small vessel ischemic changes of the white matter. Electronically Signed   By: Donavan Foil M.D.   On: 07/09/2020 22:46   DG Chest Portable 1 View  Result Date: 07/09/2020 CLINICAL DATA:  Weakness EXAM:  PORTABLE CHEST 1 VIEW COMPARISON:  None. FINDINGS: A stimulator pack is noted projecting over the right chest wall.  The heart size is mildly enlarged. Aortic calcifications are noted. There is no pneumothorax. No large pleural effusion. Streaky bibasilar airspace opacities are noted favored to represent areas of atelectasis and scarring. IMPRESSION: 1. No acute cardiopulmonary process. 2. Bibasilar atelectasis and scarring. 3. Mild cardiomegaly. 4. Aortic calcifications. Electronically Signed   By: Constance Holster M.D.   On: 07/09/2020 22:12    Scheduled Meds: . carbidopa-levodopa  1.5 tablet Oral TID  . donepezil  5 mg Oral QHS  . melatonin  5 mg Oral QHS  . pantoprazole  40 mg Oral Daily  . QUEtiapine  25 mg Oral QHS  . rOPINIRole  8 mg Oral QHS   Continuous Infusions: . sodium chloride Stopped (07/10/20 1610)  . cefTRIAXone (ROCEPHIN)  IV Stopped (07/10/20 1806)     LOS: 0 days   Time spent: 30 minutes   Darliss Cheney, MD [facility]  07/11/2020, 11:46 AM   To contact the attending provider between 7A-7P or the covering provider during after hours 7P-7A, please log into the web site [email or web address].

## 2020-07-11 NOTE — NC FL2 (Signed)
[place] LEVEL OF CARE SCREENING TOOL     IDENTIFICATION  Patient Name: Wesley Anderson. Birthdate: Jun 30, 1946 Sex: male Admission Date (Current Location): 07/09/2020  [place] and [place] Number:  Engineering geologist and Address:  [facility], [address], [place], [place] [address]      Provider Number:    Attending Physician Name and Address:  Darliss Cheney, MD  Relative Name and Phone Number:  Rozell Theiler [phone]    Current Level of Care: Hospital Recommended Level of Care: [place] Prior Approval Number:    Date Approved/Denied:   PASRR Number: [id number]  Discharge Plan: SNF    Current Diagnoses: Patient Active Problem List   Diagnosis Date Noted  . Acute metabolic encephalopathy 87/56/4332  . AKI (acute kidney injury) ([place]) 07/09/2020  . Leukocytosis 07/09/2020  . S/P deep brain stimulator placement 07/09/2020  . Squamous cell carcinoma, 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 04/24/2020  . Cognitive impairment 02/22/2013  . Parkinson's disease ([place]) 09/28/2011    Orientation RESPIRATION BLADDER Height & Weight     Self, Time, Place    Incontinent Weight: 180 lb 12.4 oz (82 kg) Height:  5\' 9"  (175.3 cm)  BEHAVIORAL SYMPTOMS/MOOD NEUROLOGICAL BOWEL NUTRITION STATUS      Incontinent    AMBULATORY STATUS COMMUNICATION OF NEEDS Skin   Extensive Assist Verbally Surgical wounds                       Personal Care Assistance Level of Assistance  Bathing, Feeding, Dressing, Total care Bathing Assistance: Limited assistance Feeding assistance: Limited assistance Dressing Assistance: Maximum assistance Total Care Assistance: Limited assistance   Functional Limitations Info             SPECIAL CARE FACTORS FREQUENCY  PT (By licensed PT), OT (By licensed OT)     PT Frequency: 5 x weekly OT Frequency: 5 x weekly            Contractures      Additional Factors Info   Code Status, Allergies Code Status Info: Full Allergies Info: Artane           Current Medications (07/11/2020):  This is the current hospital active medication list Current Facility-Administered Medications  Medication Dose Route Frequency Provider Last Rate Last Admin  . 0.9 %  sodium chloride infusion   Intravenous Continuous Darliss Cheney, MD   Paused at 07/10/20 1610  . acetaminophen (TYLENOL) tablet 650 mg  650 mg Oral Q6H PRN Athena Masse, MD       Or  . acetaminophen (TYLENOL) suppository 650 mg  650 mg Rectal Q6H PRN Athena Masse, MD      . carbidopa-levodopa (SINEMET IR) 25-100 MG per tablet immediate release 1.5 tablet  1.5 tablet Oral TID Darliss Cheney, MD   1.5 tablet at 07/11/20 0947  . cefTRIAXone (ROCEPHIN) 1 g in sodium chloride 0.9 % 100 mL IVPB  1 g Intravenous q1800 Athena Masse, MD   Stopped at 07/10/20 1806  . donepezil (ARICEPT) tablet 5 mg  5 mg Oral QHS Athena Masse, MD   5 mg at 07/10/20 2144  . melatonin tablet 5 mg  5 mg Oral QHS Athena Masse, MD   5 mg at 07/10/20 2144  . ondansetron (ZOFRAN) tablet 4 mg  4 mg Oral Q6H PRN Athena Masse, MD       Or  . ondansetron [facility]) injection 4  mg  4 mg Intravenous Q6H PRN Athena Masse, MD      . pantoprazole (PROTONIX) EC tablet 40 mg  40 mg Oral Daily Athena Masse, MD   40 mg at 07/11/20 0947  . QUEtiapine (SEROQUEL) tablet 25 mg  25 mg Oral QHS Athena Masse, MD   25 mg at 07/10/20 2143  . rOPINIRole (REQUIP XL) 24 hr tablet 8 mg  8 mg Oral QHS Athena Masse, MD   8 mg at 07/10/20 2143     Discharge Medications: Please see discharge summary for a list of discharge medications.  Relevant Imaging Results:  Relevant Lab Results:   Additional Information SS # [id number]  Meriel Flavors, LCSW

## 2020-07-12 DIAGNOSIS — G9341 Metabolic encephalopathy: Secondary | ICD-10-CM | POA: Diagnosis not present

## 2020-07-12 LAB — CBC WITH DIFFERENTIAL/PLATELET
Abs Immature Granulocytes: 0.04 10*3/uL (ref 0.00–0.07)
Basophils Absolute: 0 10*3/uL (ref 0.0–0.1)
Basophils Relative: 0 %
Eosinophils Absolute: 0.1 10*3/uL (ref 0.0–0.5)
Eosinophils Relative: 1 %
HCT: 36.3 % — ABNORMAL LOW (ref 39.0–52.0)
Hemoglobin: 12.5 g/dL — ABNORMAL LOW (ref 13.0–17.0)
Immature Granulocytes: 1 %
Lymphocytes Relative: 12 %
Lymphs Abs: 1 10*3/uL (ref 0.7–4.0)
MCH: 31.3 pg (ref 26.0–34.0)
MCHC: 34.4 g/dL (ref 30.0–36.0)
MCV: 90.8 fL (ref 80.0–100.0)
Monocytes Absolute: 0.9 10*3/uL (ref 0.1–1.0)
Monocytes Relative: 11 %
Neutro Abs: 6.1 10*3/uL (ref 1.7–7.7)
Neutrophils Relative %: 75 %
Platelets: 192 10*3/uL (ref 150–400)
RBC: 4 MIL/uL — ABNORMAL LOW (ref 4.22–5.81)
RDW: 13.4 % (ref 11.5–15.5)
WBC: 8.1 10*3/uL (ref 4.0–10.5)
nRBC: 0 % (ref 0.0–0.2)

## 2020-07-12 LAB — BASIC METABOLIC PANEL
Anion gap: 7 (ref 5–15)
BUN: 13 mg/dL (ref 8–23)
CO2: 19 mmol/L — ABNORMAL LOW (ref 22–32)
Calcium: 8.4 mg/dL — ABNORMAL LOW (ref 8.9–10.3)
Chloride: 112 mmol/L — ABNORMAL HIGH (ref 98–111)
Creatinine, Ser: 0.78 mg/dL (ref 0.61–1.24)
GFR, Estimated: >60 mL/min (ref >60)
Glucose, Bld: 112 mg/dL — ABNORMAL HIGH (ref 70–99)
Potassium: 3.3 mmol/L — ABNORMAL LOW (ref 3.5–5.1)
Sodium: 138 mmol/L (ref 135–145)

## 2020-07-12 LAB — GLUCOSE, CAPILLARY: Glucose-Capillary: 108 mg/dL — ABNORMAL HIGH (ref 70–99)

## 2020-07-12 MED ORDER — TAMSULOSIN HCL 0.4 MG PO CAPS
0.4000 mg | ORAL_CAPSULE | Freq: Every day | ORAL | Status: DC
  Administered 2020-07-12 – 2020-07-14 (×3): 0.4 mg via ORAL
  Filled 2020-07-12 (×3): qty 1

## 2020-07-12 MED ORDER — POTASSIUM CHLORIDE 20 MEQ PO PACK
60.0000 meq | PACK | Freq: Once | ORAL | Status: AC
  Administered 2020-07-12: 60 meq via ORAL
  Filled 2020-07-12: qty 3

## 2020-07-12 MED ORDER — ZINC OXIDE 40 % EX OINT
TOPICAL_OINTMENT | Freq: Two times a day (BID) | CUTANEOUS | Status: DC
  Filled 2020-07-12: qty 113

## 2020-07-12 NOTE — Progress Notes (Signed)
PROGRESS NOTE    Wesley Anderson.  PYP:[id number] DOB: 11-26-1945 DOA: 07/09/2020 PCP: Adin Hector, MD   Brief Narrative:  Wesley Anderson. is a 74 y.o. 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  According to his wife, his confusion appeared to have improved however over the past 24 to 48 hours they have noted increasing confusion and ' talking out of his head'.  His wife states that he seems to be seeing things, was unable to recognize family members, he was stating that his family members will robots trying to give him injections as well as several other things of bizarre content.  She states that while hospitalized he was seen by a psychiatrist and started on Seroquel and after seeing his neurologist a few days after, he had his amantadine discontinued and his carbidopa dose increased and was also started on melatonin and Aricept.  His neurologist did blood work to include B12, and TSH both of which were normal on Care Everywhere.  Wife states she has been having difficulty giving him his medication including the antibiotics prescribed at discharge and his other meds.  No other complaint  On arrival, patient was afebrile, heart rate 81, respiration 26 with O2 sat 94% on room air.  Blood pressure 105/62.  Blood work significant for WBC of 18,000, and creatinine of 1.84 up from baseline of 1.1 on Care Everywhere.  Bilirubin slightly elevated at 3.  Urinalysis without pyuria but with ketones.  Lactic acid 1.  Troponin 10 Chest x-ray with no acute findings CT head no acute intracranial findings EKG as  reviewed by me : Normal sinus rhythm Patient was treated with an IV fluid bolus.    Admitted under hospital service due to acute encephalopathy.   Assessment & Plan:   Principal Problem:   Acute metabolic encephalopathy Active Problems:   Parkinson's disease ([place])   Cognitive impairment   AKI (acute kidney injury) ([place])   Leukocytosis   S/P deep brain stimulator placement   Squamous cell carcinoma, scalp/neck s/p excision on 24/58/09   Acute metabolic encephalopathy vs delirium vs dehydration: Patient seem to have combination of underlying causes for his encephalopathy which may include acute toxic encephalopathy due to possible infection at the scalp incision, delirium and  dehydration.  His wife is at the bedside.  According to her, patient has waxing and waning mentation.  They still want him to go to subacute rehab.  Confirm with social worker on call right now that we do not have a bed offer yet.      AKI/dehydration: Resolved but clinically he still looks slightly dehydrated.  Continue IV fluids.  Hypokalemia: 3.3 again today.  Will replace.  Recheck in the morning.  Diarrhea: Patient started having some diarrhea yesterday.  He was tested negative for C. difficile and GI pathogen panel was negative as well.  Is improving.    Parkinson's disease ([place])   S/P deep brain stimulator placement -Medication was adjusted by neurologist during visit on 11/22 -Continue Sinemet, 1.5 tabs 3 times daily and ropinirole 8 mg nightly.  Amantadine was discontinued  DVT prophylaxis:    Code Status: Full Code  Family  Communication: Wife present at bedside.  Plan of care discussed with patient and the wife at the bedside.  Status is: Observation  The patient will require care spanning > 2 midnights and should be moved to inpatient because: Inpatient level of care appropriate due to severity of illness  Dispo: The patient is from: Home              Anticipated d/c is to: SNF               Anticipated d/c date is: 1 day              Patient currently is medically stable to d/c.        Estimated body mass index is 26.7 kg/m as calculated from the following:   Height as of this encounter: 5\' 9"  (1.753 m).   Weight as of this encounter: 82 kg.      Nutritional status:               Consultants:   None  Procedures:   None  Antimicrobials:  Anti-infectives (From admission, onward)   Start     Dose/Rate Route Frequency Ordered Stop   07/10/20 0030  cefTRIAXone (ROCEPHIN) 1 g in sodium chloride 0.9 % 100 mL IVPB        1 g 200 mL/hr over 30 Minutes Intravenous Daily-1800 07/10/20 0014           Subjective: Seen and examined.  Patient alert and oriented at this point in time.  Wife at the bedside.  Per her, he was slightly confused earlier.  Patient has no complaint.  Objective: Vitals:   07/11/20 2052 07/12/20 0031 07/12/20 0343 07/12/20 0949  BP: (!) 144/82 (!) 144/80 138/74 123/77  Pulse: (!) 59 (!) 57 (!) 51 (!) 55  Resp: 18 15 16 20   Temp: 98.7 F (37.1 C) 98.7 F (37.1 C) 98.6 F (37 C) 98.3 F (36.8 C)  TempSrc: Oral Oral Oral   SpO2: 99% 96% 96% 96%  Weight:      Height:        Intake/Output Summary (Last 24 hours) at 07/12/2020 1143 Last data filed at 07/12/2020 0700 Gross per 24 hour  Intake 2723.3 ml  Output 300 ml  Net 2423.3 ml   Filed Weights   07/09/20 2136  Weight: 82 kg    Examination:  General exam: Appears calm and comfortable  Respiratory system: Clear to auscultation. Respiratory effort normal. Cardiovascular system: S1 & S2 heard, RRR. No JVD, murmurs, rubs, gallops or clicks. No pedal edema. Gastrointestinal system: Abdomen is nondistended, soft and nontender. No organomegaly or masses felt. Normal bowel sounds heard. Central nervous system: Alert and oriented. No focal neurological deficits. Extremities: Symmetric 5 x 5 power. Skin: Sutures and stitches on the scalp as pictured above.  Data  Reviewed: I have personally reviewed following labs and imaging studies  CBC: Recent Labs  Lab 07/09/20 2140 07/10/20 0811 07/11/20 0512 07/12/20 0540  WBC 18.1* 11.2* 8.0 8.1  NEUTROABS  --  9.1* 5.9 6.1  HGB 15.3 14.0 12.5* 12.5*  HCT 46.0 41.5 36.9* 36.3*  MCV 93.1 91.2 91.8 90.8  PLT 218 194 183 992   Basic Metabolic Panel: Recent Labs  Lab 07/09/20 2140 07/10/20 0811 07/11/20 0512 07/12/20 0540  NA 139 138 138 138  K 3.5 3.6 3.4* 3.3*  CL 105 108 111 112*  CO2 22 21* 20* 19*  GLUCOSE 135* 127* 100* 112*  BUN 36* 30* 23  13  CREATININE 1.84* 1.27* 0.94 0.78  CALCIUM 9.7 9.3 8.7* 8.4*  MG  --  2.1  --   --    GFR: Estimated Creatinine Clearance: 83.5 mL/min (by C-G formula based on SCr of 0.78 mg/dL). Liver Function Tests: Recent Labs  Lab 07/09/20 2140 07/10/20 0811  AST 28 20  ALT 29 29  ALKPHOS 80 69  BILITOT 3.0* 2.8*  PROT 8.1 6.8  ALBUMIN 4.4 3.5   No results for input(s): LIPASE, AMYLASE in the last 168 hours. Recent Labs  Lab 07/10/20 0811  AMMONIA 18   Coagulation Profile: No results for input(s): INR, PROTIME in the last 168 hours. Cardiac Enzymes: No results for input(s): CKTOTAL, CKMB, CKMBINDEX, TROPONINI in the last 168 hours. BNP (last 3 results) No results for input(s): PROBNP in the last 8760 hours. HbA1C: No results for input(s): HGBA1C in the last 72 hours. CBG: No results for input(s): GLUCAP in the last 168 hours. Lipid Profile: No results for input(s): CHOL, HDL, LDLCALC, TRIG, CHOLHDL, LDLDIRECT in the last 72 hours. Thyroid Function Tests: No results for input(s): TSH, T4TOTAL, FREET4, T3FREE, THYROIDAB in the last 72 hours. Anemia Panel: No results for input(s): VITAMINB12, FOLATE, FERRITIN, TIBC, IRON, RETICCTPCT in the last 72 hours. Sepsis Labs: Recent Labs  Lab 07/09/20 2251 07/10/20 0119  LATICACIDVEN 1.0 1.4    Recent Results (from the past 240 hour(s))  Resp Panel by RT-PCR (Flu A&B, Covid) Nasopharyngeal  Swab     Status: None   Collection Time: 07/09/20 10:51 PM   Specimen: Nasopharyngeal Swab; Nasopharyngeal(NP) swabs in vial transport medium  Result Value Ref Range Status   SARS Coronavirus 2 by RT PCR NEGATIVE NEGATIVE Final    Comment: (NOTE) SARS-CoV-2 target nucleic acids are NOT DETECTED.  The SARS-CoV-2 RNA is generally detectable in upper respiratory specimens during the acute phase of infection. The lowest concentration of SARS-CoV-2 viral copies this assay can detect is 138 copies/mL. A negative result does not preclude SARS-Cov-2 infection and should not be used as the sole basis for treatment or other patient management decisions. A negative result may occur with  improper specimen collection/handling, submission of specimen other than nasopharyngeal swab, presence of viral mutation(s) within the areas targeted by this assay, and inadequate number of viral copies(<138 copies/mL). A negative result must be combined with clinical observations, patient history, and epidemiological information. The expected result is Negative.  Fact Sheet for Patients:  EntrepreneurPulse.com.au  Fact Sheet for Healthcare Providers:  IncredibleEmployment.be  This test is no t yet approved or cleared by the [place] FDA and  has been authorized for detection and/or diagnosis of SARS-CoV-2 by FDA under an Emergency Use Authorization (EUA). This EUA will remain  in effect (meaning this test can be used) for the duration of the COVID-19 declaration under Section 564(b)(1) of the Act, 21 U.S.C.section 360bbb-3(b)(1), unless the authorization is terminated  or revoked sooner.       Influenza A by PCR NEGATIVE NEGATIVE Final   Influenza B by PCR NEGATIVE NEGATIVE Final    Comment: (NOTE) The Xpert Xpress SARS-CoV-2/FLU/RSV plus assay is intended as an aid in the diagnosis of influenza from Nasopharyngeal swab specimens and should not be used as a sole  basis for treatment. Nasal washings and aspirates are unacceptable for Xpert Xpress SARS-CoV-2/FLU/RSV testing.  Fact Sheet for Patients: EntrepreneurPulse.com.au  Fact Sheet for Healthcare Providers: IncredibleEmployment.be  This test is not yet approved or cleared by the [place] FDA and has been  authorized for detection and/or diagnosis of SARS-CoV-2 by FDA under an Emergency Use Authorization (EUA). This EUA will remain in effect (meaning this test can be used) for the duration of the COVID-19 declaration under Section 564(b)(1) of the Act, 21 U.S.C. section 360bbb-3(b)(1), unless the authorization is terminated or revoked.  Performed at [facility], [address] [place], [place] [address]   C Difficile Quick Screen w PCR reflex     Status: None   Collection Time: 07/10/20  1:01 PM   Specimen: STOOL  Result Value Ref Range Status   C Diff antigen NEGATIVE NEGATIVE Final   C Diff toxin NEGATIVE NEGATIVE Final   C Diff interpretation No C. difficile detected.  Final    Comment: Performed at [facility], [address] [place], [place] [address]  Gastrointestinal Panel by PCR , Stool     Status: None   Collection Time: 07/10/20  1:01 PM   Specimen: STOOL  Result Value Ref Range Status   Campylobacter species NOT DETECTED NOT DETECTED Final   Plesimonas shigelloides NOT DETECTED NOT DETECTED Final   Salmonella species NOT DETECTED NOT DETECTED Final   Yersinia enterocolitica NOT DETECTED NOT DETECTED Final   Vibrio species NOT DETECTED NOT DETECTED Final   Vibrio cholerae NOT DETECTED NOT DETECTED Final   Enteroaggregative E coli (EAEC) NOT DETECTED NOT DETECTED Final   Enteropathogenic E coli (EPEC) NOT DETECTED NOT DETECTED Final   Enterotoxigenic E coli (ETEC) NOT DETECTED NOT DETECTED Final   Shiga like toxin producing E coli (STEC) NOT DETECTED NOT DETECTED Final   Shigella/Enteroinvasive E coli (EIEC)  NOT DETECTED NOT DETECTED Final   Cryptosporidium NOT DETECTED NOT DETECTED Final   Cyclospora cayetanensis NOT DETECTED NOT DETECTED Final   Entamoeba histolytica NOT DETECTED NOT DETECTED Final   Giardia lamblia NOT DETECTED NOT DETECTED Final   Adenovirus F40/41 NOT DETECTED NOT DETECTED Final   Astrovirus NOT DETECTED NOT DETECTED Final   Norovirus GI/GII NOT DETECTED NOT DETECTED Final   Rotavirus A NOT DETECTED NOT DETECTED Final   Sapovirus (I, II, IV, and V) NOT DETECTED NOT DETECTED Final    Comment: Performed at [facility], [address], [place], [place] [address]      Radiology Studies: No results found.  Scheduled Meds: . carbidopa-levodopa  1.5 tablet Oral TID  . donepezil  5 mg Oral QHS  . melatonin  5 mg Oral QHS  . pantoprazole  40 mg Oral Daily  . QUEtiapine  25 mg Oral QHS  . rOPINIRole  8 mg Oral QHS   Continuous Infusions: . sodium chloride 100 mL/hr at 07/12/20 0441  . cefTRIAXone (ROCEPHIN)  IV Stopped (07/11/20 1758)     LOS: 0 days   Time spent: 29 minutes   Darliss Cheney, MD [facility]  07/12/2020, 11:43 AM   To contact the attending provider between 7A-7P or the covering provider during after hours 7P-7A, please log into the web site [email or web address].

## 2020-07-12 NOTE — Progress Notes (Signed)
Pt vomited approximately 150 mls of greenish, bile, emesis at 0430.  PRN dose of po zofran administered and effective.

## 2020-07-13 DIAGNOSIS — G9341 Metabolic encephalopathy: Secondary | ICD-10-CM | POA: Diagnosis not present

## 2020-07-13 LAB — BASIC METABOLIC PANEL
Anion gap: 8 (ref 5–15)
BUN: 12 mg/dL (ref 8–23)
CO2: 22 mmol/L (ref 22–32)
Calcium: 8.5 mg/dL — ABNORMAL LOW (ref 8.9–10.3)
Chloride: 107 mmol/L (ref 98–111)
Creatinine, Ser: 0.95 mg/dL (ref 0.61–1.24)
GFR, Estimated: >60 mL/min (ref >60)
Glucose, Bld: 96 mg/dL (ref 70–99)
Potassium: 3.6 mmol/L (ref 3.5–5.1)
Sodium: 137 mmol/L (ref 135–145)

## 2020-07-13 LAB — RESP PANEL BY RT-PCR (FLU A&B, COVID) ARPGX2
Influenza A by PCR: NEGATIVE
Influenza B by PCR: NEGATIVE
SARS Coronavirus 2 by RT PCR: NEGATIVE

## 2020-07-13 NOTE — Discharge Instructions (Signed)
Delirium Delirium is a state of mental confusion. It comes on quickly and causes significant changes in a person's thinking and behavior. People with delirium usually have trouble paying attention to what is going on or knowing where they are. They may become very withdrawn or very emotional and unable to sit still. They may even see or feel things that are not there (hallucinations). Delirium is a sign of a serious underlying medical condition. What are the causes? Delirium occurs when something suddenly affects the signals that the brain sends out. Brain signals can be affected by anything that puts severe stress on the body and brain and causes brain chemicals to be out of balance. The most common causes of delirium include:  Infections. These may be bacterial, viral, fungal, or protozoal.  Medicines. These include many over-the-counter and prescription medicines.  Recreational drugs.  Substance withdrawal. This occurs with sudden discontinuation of alcohol, certain medicines, or recreational drugs.  Surgery and anesthesia.  Sudden vascular events, such as stroke and brain hemorrhage.  Other brain disorders, such as migraines, tumors, seizures, and physical head trauma.  Metabolic disorders, such as kidney or liver failure.  Low blood oxygen (anoxia). This may occur with lung disease, cardiac arrest, or carbon monoxide poisoning.  Hormone imbalances (endocrinopathies), such as an overactive thyroid (hyperthyroidism) or underactive thyroid (hypothyroidism).  Vitamin deficiencies. What increases the risk? The following factors may make someone more likely to develop this condition.  Being a child.  Being an older person.  Living alone.  Having vision loss or hearing loss.  Having an existing brain disease, such as dementia.  Having long-lasting (chronic) medical conditions, such as heart disease.  Being hospitalized for long periods of time. What are the signs or  symptoms? Delirium starts with a sudden change in a person's thinking or behavior. Symptoms include:  Not being able to stay awake (drowsiness) or pay attention.  Being confused about places, time, and people.  Forgetfulness.  Having extreme energy levels. These may be low or high.  Changes in sleep patterns.  Extreme mood swings, such as sudden anger or anxiety.  Focusing on things or ideas that are not important.  Rambling and senseless talking.  Difficulty speaking, understanding speech, or both.  Hallucinations.  Tremor or unsteady gait. Symptoms come and go (fluctuate) over time, and they are often worse at the end of the day. How is this diagnosed? People with delirium may not realize that they have the condition. Often, a family member or health care provider is the first person to notice the changes. This condition may be diagnosed based on a physical exam, health history, and tests.  The health care provider will obtain a detailed history. This may include questions about: ? Current symptoms. ? Medical issues. ? Medicines. ? Recreational drug use.  The health care provider will perform a mental status examination by: ? Asking questions to check for confusion. ? Watching for abnormal behavior.  The health care provider may also order lab tests or additional studies to determine the cause of the delirium. How is this treated? Treatment of delirium depends on the cause and severity. Delirium usually goes away within days or weeks of treating the underlying cause. In the meantime, do not leave the person alone because he or she may accidentally cause self-harm. This condition may be treated with supportive care, such as:  Increased light during the day and decreased light at night.  Low noise level.  Uninterrupted sleep.  A regular daily schedule.    Clocks and calendars to help with orientation.  Familiar objects, including the person's pictures and  clothing.  Frequent visits from familiar family and friends.  A healthy diet.  Gentle exercise. In more severe cases of delirium, medicine may be prescribed to help the person keep calm and think more clearly. Follow these instructions at home:  Continue supportive care as told by a health care provider.  Over-the-counter and prescription medicines should be taken only as told by a health care provider.  Ask a health care provider before using herbs or supplements.  Do not use alcohol or recreational drugs.  Keep all follow-up visits as told by a health care provider. This is important. Contact a health care provider if:  Symptoms do not get better or they become worse.  New symptoms of delirium develop.  Caring for the person at home does not seem safe.  Eating, drinking, or communicating stops.  There are side effects of medicines, such as changes in sleep patterns, dizziness, weight gain, restlessness, movement changes, or tremors. Get help right away if:  Serious thoughts occur about self-harm or about hurting others.  There are serious side effects of medicine, such as: ? Swelling of the face, lips, tongue, or throat. ? Fever, confusion, muscle spasms, or seizures. Summary  Delirium is a state of mental confusion. It comes on quickly and causes significant changes in a person's thinking and behavior.  Delirium is a sign of a serious underlying medical condition.  Certain medical conditions or a long hospital stay may increase the risk of developing delirium.  Treatment of delirium involves treating the underlying cause and providing supportive treatments, such as a calm and familiar environment. This information is not intended to replace advice given to you by your health care provider. Make sure you discuss any questions you have with your health care provider. Document Revised: 03/22/2018 Document Reviewed: 03/22/2018 Elsevier Patient Education  2020 Elsevier  Inc.  

## 2020-07-13 NOTE — Progress Notes (Signed)
Physical Therapy Treatment Patient Details Name: Wesley Anderson. MRN: [id number] DOB: Jun 29, 1946 Today's Date: 07/13/2020    History of Present Illness Wesley Anderson is a 89yoM who comes [facility] on 11/25 c AMS. Pt underwent precedure 1 week ago at [facility] and was subsequently combative adn altered. PMH: PD c deep brain stimulator, cognifive impairment, squamous cell carcinoma s/p excision. At baseline pt live with wife, uses a RW for AMB in home, mostly household AMB, does not typically need any assist for ADL completion.    PT Comments    OOB with min a and increased time, cues.  He is able to stand and walk 2 laps in room with RW and min a x 1.  Knees flexed throughout and begin to "sink" as he fatigued but no LOB or buckling noted.  He does require hands on assist at all times and cues for safety but overall does well.  Wife in for session.    SNF remains appropriate for discharge.   Follow Up Recommendations  SNF;Supervision for mobility/OOB     Equipment Recommendations  None recommended by PT    Recommendations for Other Services       Precautions / Restrictions Precautions Precautions: Fall Restrictions Weight Bearing Restrictions: No Other Position/Activity Restrictions: R side scalp dressing/stitches in place    Mobility  Bed Mobility Overal bed mobility: Needs Assistance Bed Mobility: Supine to Sit     Supine to sit: Min assist        Transfers Overall transfer level: Needs assistance Equipment used: Rolling walker (2 wheeled) Transfers: Sit to/from Stand Sit to Stand: Min assist         General transfer comment: cues for hand placements  Ambulation/Gait Ambulation/Gait assistance: Min assist;Min guard Gait Distance (Feet): 50 Feet Assistive device: Rolling walker (2 wheeled) Gait Pattern/deviations: Step-through pattern;Decreased step length - right;Decreased step length - left;Trunk flexed;Shuffle Gait velocity: decreased   General Gait  Details: improved today but remains with general weakness and "sinking knees" with gait distance/fatigue   Stairs             Wheelchair Mobility    Modified Rankin (Stroke Patients Only)       Balance Overall balance assessment: Needs assistance Sitting-balance support: No upper extremity supported Sitting balance-Leahy Scale: Fair     Standing balance support: Bilateral upper extremity supported Standing balance-Leahy Scale: Fair                              Cognition Arousal/Alertness: Awake/alert Behavior During Therapy: WFL for tasks assessed/performed Overall Cognitive Status: History of cognitive impairments - at baseline                                        Exercises Other Exercises Other Exercises: ankle pumos, LAQ, marches and ab/add x 10 in sitting    General Comments        Pertinent Vitals/Pain Pain Assessment: No/denies pain    Home Living                      Prior Function            PT Goals (current goals can now be found in the care plan section) Progress towards PT goals: Progressing toward goals    Frequency    Min 2X/week  PT Plan Current plan remains appropriate    Co-evaluation              AM-PAC PT "6 Clicks" Mobility   Outcome Measure  Help needed turning from your back to your side while in a flat bed without using bedrails?: A Little Help needed moving from lying on your back to sitting on the side of a flat bed without using bedrails?: A Little Help needed moving to and from a bed to a chair (including a wheelchair)?: A Little Help needed standing up from a chair using your arms (e.g., wheelchair or bedside chair)?: A Little Help needed to walk in hospital room?: A Little Help needed climbing 3-5 steps with a railing? : A Lot 6 Click Score: 17    End of Session Equipment Utilized During Treatment: Gait belt Activity Tolerance: Patient tolerated treatment  well;No increased pain Patient left: in chair;with call bell/phone within reach;with chair alarm set;with family/visitor present Nurse Communication: Mobility status PT Visit Diagnosis: Unsteadiness on feet (R26.81);Other abnormalities of gait and mobility (R26.89)     Time: 8295-6213 PT Time Calculation (min) (ACUTE ONLY): 18 min  Charges:  $Gait Training: 8-22 mins                    Chesley Noon, PTA 07/13/20, 4:45 PM

## 2020-07-13 NOTE — Progress Notes (Signed)
PROGRESS NOTE    Wesley Anderson.  ZSW:[id number] DOB: 1945/11/13 DOA: 07/09/2020 PCP: Adin Hector, MD   Brief Narrative:  Wesley Hershberger. is a 74 y.o. 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  According to his wife, his confusion appeared to have improved however over the past 24 to 48 hours they have noted increasing confusion and ' talking out of his head'.  His wife states that he seems to be seeing things, was unable to recognize family members, he was stating that his family members will robots trying to give him injections as well as several other things of bizarre content.  She states that while hospitalized he was seen by a psychiatrist and started on Seroquel and after seeing his neurologist a few days after, he had his amantadine discontinued and his carbidopa dose increased and was also started on melatonin and Aricept.  His neurologist did blood work to include B12, and TSH both of which were normal on Care Everywhere.  Wife states she has been having difficulty giving him his medication including the antibiotics prescribed at discharge and his other meds.  No other complaint  On arrival, patient was afebrile, heart rate 81, respiration 26 with O2 sat 94% on room air.  Blood pressure 105/62.  Blood work significant for WBC of 18,000, and creatinine of 1.84 up from baseline of 1.1 on Care Everywhere.  Bilirubin slightly elevated at 3.  Urinalysis without pyuria but with ketones.  Lactic acid 1.  Troponin 10 Chest x-ray with no acute findings CT head no acute intracranial findings EKG as  reviewed by me : Normal sinus rhythm Patient was treated with an IV fluid bolus.    Admitted under hospital service due to acute encephalopathy.  Patient encephalopathy almost resolved within 24 hours.  Patient's family was reluctant to take him home as he was little aggressive and agitated at home and his wife was not comfortable taking him back home and requested discharging him to skilled nursing facility.  He was assessed by PT OT and they also recommended a skilled nursing facility.  Patient waited in the hospital for 3 days while TOC was finding a bed.   Assessment & Plan:   Principal Problem:   Acute metabolic encephalopathy Active Problems:   Parkinson's disease ([place])   Cognitive impairment   AKI (acute kidney injury) ([place])   Leukocytosis   S/P deep brain stimulator placement   Squamous cell carcinoma, scalp/neck s/p excision on 32/20/25   Acute metabolic encephalopathy vs delirium vs dehydration: Patient seem to have combination of underlying causes for his encephalopathy which may include acute toxic encephalopathy due to possible infection at the scalp incision, delirium and  dehydration.  His wife is at the bedside.  According to her, patient has waxing and waning mentation.  They still want him to go to subacute rehab.  Unfortunately we are still waiting for bed offers and we have none so far.      AKI/dehydration: Resolved.  Hypokalemia: Resolved.  Diarrhea: Patient started having some diarrhea yesterday.  He was tested negative for C. difficile and GI pathogen panel was negative as well.  Is improving.  Parkinson's disease ([place])   S/P deep brain stimulator placement -Medication was adjusted by neurologist during visit on 11/22 -Continue Sinemet, 1.5 tabs 3 times daily and ropinirole 8 mg nightly.  Amantadine was discontinued  DVT prophylaxis:    Code Status: Full Code  Family Communication: None present at bedside.  Updated his wife over the phone.  Status is:  Observation  The patient will require care spanning > 2 midnights and should be moved to inpatient because: Inpatient level of care appropriate due to severity of illness  Dispo: The patient is from: Home              Anticipated d/c is to: SNF, waiting for bed offer              Anticipated d/c date is: 1 day              Patient currently is medically stable to d/c.        Estimated body mass index is 26.7 kg/m as calculated from the following:   Height as of this encounter: 5\' 9"  (1.753 m).   Weight as of this encounter: 82 kg.      Nutritional status:               Consultants:   None  Procedures:   None  Antimicrobials:  Anti-infectives (From admission, onward)   Start     Dose/Rate Route Frequency Ordered Stop   07/10/20 0030  cefTRIAXone (ROCEPHIN) 1 g in sodium chloride 0.9 % 100 mL IVPB        1 g 200 mL/hr over 30 Minutes Intravenous Daily-1800 07/10/20 0014           Subjective: Patient seen and examined this morning.  He was alert and oriented.  No complaints.  Frustrated for being in the hospital longer than he should have.  Objective: Vitals:   07/13/20 0054 07/13/20 0520 07/13/20 0733 07/13/20 1112  BP: 140/70 123/65 111/64 137/75  Pulse: 84 (!) 53 (!) 58 (!) 52  Resp: 17 17 18 17   Temp: 98.3 F (36.8 C) 98.3 F (36.8 C) 98.6 F (37 C) 98.6 F (37 C)  TempSrc:      SpO2: 94% 95% 99% 97%  Weight:      Height:        Intake/Output Summary (Last 24 hours) at 07/13/2020 1420 Last data filed at 07/13/2020 0900 Gross per 24 hour  Intake 1545.86 ml  Output 425 ml  Net 1120.86 ml   Filed Weights   07/09/20 2136  Weight: 82 kg    Examination:  General exam: Appears calm and comfortable  Respiratory system: Clear to auscultation. Respiratory effort normal. Cardiovascular system: S1 & S2 heard, RRR. No JVD, murmurs, rubs, gallops or clicks. No pedal edema. Gastrointestinal system: Abdomen is nondistended, soft and  nontender. No organomegaly or masses felt. Normal bowel sounds heard. Central nervous system: Alert and oriented. No focal neurological deficits. Extremities: Symmetric 5 x 5 power. Skin: Sutures in the scalp Psychiatry: Judgement and insight appear poor. Mood & affect flat  Data Reviewed: I have personally reviewed following labs and imaging studies  CBC: Recent Labs  Lab 07/09/20 2140 07/10/20 0811 07/11/20 0512 07/12/20 0540  WBC 18.1* 11.2* 8.0 8.1  NEUTROABS  --  9.1* 5.9 6.1  HGB 15.3 14.0 12.5* 12.5*  HCT 46.0 41.5 36.9* 36.3*  MCV 93.1 91.2 91.8 90.8  PLT 218 194 183 195   Basic Metabolic Panel: Recent Labs  Lab  07/09/20 2140 07/10/20 0811 07/11/20 0512 07/12/20 0540 07/13/20 0449  NA 139 138 138 138 137  K 3.5 3.6 3.4* 3.3* 3.6  CL 105 108 111 112* 107  CO2 22 21* 20* 19* 22  GLUCOSE 135* 127* 100* 112* 96  BUN 36* 30* 23 13 12   CREATININE 1.84* 1.27* 0.94 0.78 0.95  CALCIUM 9.7 9.3 8.7* 8.4* 8.5*  MG  --  2.1  --   --   --    GFR: Estimated Creatinine Clearance: 70.3 mL/min (by C-G formula based on SCr of 0.95 mg/dL). Liver Function Tests: Recent Labs  Lab 07/09/20 2140 07/10/20 0811  AST 28 20  ALT 29 29  ALKPHOS 80 69  BILITOT 3.0* 2.8*  PROT 8.1 6.8  ALBUMIN 4.4 3.5   No results for input(s): LIPASE, AMYLASE in the last 168 hours. Recent Labs  Lab 07/10/20 0811  AMMONIA 18   Coagulation Profile: No results for input(s): INR, PROTIME in the last 168 hours. Cardiac Enzymes: No results for input(s): CKTOTAL, CKMB, CKMBINDEX, TROPONINI in the last 168 hours. BNP (last 3 results) No results for input(s): PROBNP in the last 8760 hours. HbA1C: No results for input(s): HGBA1C in the last 72 hours. CBG: Recent Labs  Lab 07/12/20 1307  GLUCAP 108*   Lipid Profile: No results for input(s): CHOL, HDL, LDLCALC, TRIG, CHOLHDL, LDLDIRECT in the last 72 hours. Thyroid Function Tests: No results for input(s): TSH, T4TOTAL, FREET4, T3FREE,  THYROIDAB in the last 72 hours. Anemia Panel: No results for input(s): VITAMINB12, FOLATE, FERRITIN, TIBC, IRON, RETICCTPCT in the last 72 hours. Sepsis Labs: Recent Labs  Lab 07/09/20 2251 07/10/20 0119  LATICACIDVEN 1.0 1.4    Recent Results (from the past 240 hour(s))  Resp Panel by RT-PCR (Flu A&B, Covid) Nasopharyngeal Swab     Status: None   Collection Time: 07/09/20 10:51 PM   Specimen: Nasopharyngeal Swab; Nasopharyngeal(NP) swabs in vial transport medium  Result Value Ref Range Status   SARS Coronavirus 2 by RT PCR NEGATIVE NEGATIVE Final    Comment: (NOTE) SARS-CoV-2 target nucleic acids are NOT DETECTED.  The SARS-CoV-2 RNA is generally detectable in upper respiratory specimens during the acute phase of infection. The lowest concentration of SARS-CoV-2 viral copies this assay can detect is 138 copies/mL. A negative result does not preclude SARS-Cov-2 infection and should not be used as the sole basis for treatment or other patient management decisions. A negative result may occur with  improper specimen collection/handling, submission of specimen other than nasopharyngeal swab, presence of viral mutation(s) within the areas targeted by this assay, and inadequate number of viral copies(<138 copies/mL). A negative result must be combined with clinical observations, patient history, and epidemiological information. The expected result is Negative.  Fact Sheet for Patients:  EntrepreneurPulse.com.au  Fact Sheet for Healthcare Providers:  IncredibleEmployment.be  This test is no t yet approved or cleared by the [place] FDA and  has been authorized for detection and/or diagnosis of SARS-CoV-2 by FDA under an Emergency Use Authorization (EUA). This EUA will remain  in effect (meaning this test can be used) for the duration of the COVID-19 declaration under Section 564(b)(1) of the Act, 21 U.S.C.section 360bbb-3(b)(1), unless the  authorization is terminated  or revoked sooner.       Influenza A by PCR NEGATIVE NEGATIVE Final   Influenza B by PCR NEGATIVE NEGATIVE Final    Comment: (NOTE) The Xpert Xpress SARS-CoV-2/FLU/RSV plus assay is intended as an aid in the diagnosis of  influenza from Nasopharyngeal swab specimens and should not be used as a sole basis for treatment. Nasal washings and aspirates are unacceptable for Xpert Xpress SARS-CoV-2/FLU/RSV testing.  Fact Sheet for Patients: EntrepreneurPulse.com.au  Fact Sheet for Healthcare Providers: IncredibleEmployment.be  This test is not yet approved or cleared by the [place] FDA and has been authorized for detection and/or diagnosis of SARS-CoV-2 by FDA under an Emergency Use Authorization (EUA). This EUA will remain in effect (meaning this test can be used) for the duration of the COVID-19 declaration under Section 564(b)(1) of the Act, 21 U.S.C. section 360bbb-3(b)(1), unless the authorization is terminated or revoked.  Performed at [facility], [address] [place], [place] [address]   C Difficile Quick Screen w PCR reflex     Status: None   Collection Time: 07/10/20  1:01 PM   Specimen: STOOL  Result Value Ref Range Status   C Diff antigen NEGATIVE NEGATIVE Final   C Diff toxin NEGATIVE NEGATIVE Final   C Diff interpretation No C. difficile detected.  Final    Comment: Performed at [facility], [address] [place], [place] [address]  Gastrointestinal Panel by PCR , Stool     Status: None   Collection Time: 07/10/20  1:01 PM   Specimen: STOOL  Result Value Ref Range Status   Campylobacter species NOT DETECTED NOT DETECTED Final   Plesimonas shigelloides NOT DETECTED NOT DETECTED Final   Salmonella species NOT DETECTED NOT DETECTED Final   Yersinia enterocolitica NOT DETECTED NOT DETECTED Final   Vibrio species NOT DETECTED NOT DETECTED Final   Vibrio cholerae NOT  DETECTED NOT DETECTED Final   Enteroaggregative E coli (EAEC) NOT DETECTED NOT DETECTED Final   Enteropathogenic E coli (EPEC) NOT DETECTED NOT DETECTED Final   Enterotoxigenic E coli (ETEC) NOT DETECTED NOT DETECTED Final   Shiga like toxin producing E coli (STEC) NOT DETECTED NOT DETECTED Final   Shigella/Enteroinvasive E coli (EIEC) NOT DETECTED NOT DETECTED Final   Cryptosporidium NOT DETECTED NOT DETECTED Final   Cyclospora cayetanensis NOT DETECTED NOT DETECTED Final   Entamoeba histolytica NOT DETECTED NOT DETECTED Final   Giardia lamblia NOT DETECTED NOT DETECTED Final   Adenovirus F40/41 NOT DETECTED NOT DETECTED Final   Astrovirus NOT DETECTED NOT DETECTED Final   Norovirus GI/GII NOT DETECTED NOT DETECTED Final   Rotavirus A NOT DETECTED NOT DETECTED Final   Sapovirus (I, II, IV, and V) NOT DETECTED NOT DETECTED Final    Comment: Performed at [facility], [address], [place], [place] [address]      Radiology Studies: No results found.  Scheduled Meds: . carbidopa-levodopa  1.5 tablet Oral TID  . donepezil  5 mg Oral QHS  . liver oil-zinc oxide   Topical BID  . melatonin  5 mg Oral QHS  . pantoprazole  40 mg Oral Daily  . QUEtiapine  25 mg Oral QHS  . rOPINIRole  8 mg Oral QHS  . tamsulosin  0.4 mg Oral Daily   Continuous Infusions: . cefTRIAXone (ROCEPHIN)  IV Stopped (07/12/20 1851)     LOS: 0 days   Time spent: 27 minutes   Darliss Cheney, MD [facility]  07/13/2020, 2:20 PM   To contact the attending provider between 7A-7P or the covering provider during after hours 7P-7A, please log into the web site [email or web address].

## 2020-07-13 NOTE — NC FL2 (Signed)
[facility] LEVEL OF CARE SCREENING TOOL     IDENTIFICATION  Patient Name: Wesley Anderson. Birthdate: [birth year] Sex: male Admission Date (Current Location): 07/09/2020  [facility] [place] Number:  Engineering geologist and Address:  [facility], [address] [place], [place] [address]      Provider Number:    Attending Physician Name and Address:  Darliss Cheney, MD  Relative Name and Phone Number:  Sammuel Blick [phone]    Current Level of Care: Hospital Recommended Level of Care: [place] Prior Approval Number:    Date Approved/Denied:   PASRR Number: [id number]  Discharge Plan: SNF    Current Diagnoses: Patient Active Problem List   Diagnosis Date Noted   Acute metabolic encephalopathy 66/59/9357   AKI (acute kidney injury) ([place]) 07/09/2020   Leukocytosis 07/09/2020   S/P deep brain stimulator placement 07/09/2020   Squamous cell carcinoma, 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 04/24/2020   Cognitive impairment 02/22/2013   Parkinson's disease ([place]) 09/28/2011    Orientation RESPIRATION BLADDER Height & Weight     Self, Time, Place  Normal Incontinent Weight: 82 kg Height:  5\' 9"  (175.3 cm)  BEHAVIORAL SYMPTOMS/MOOD NEUROLOGICAL BOWEL NUTRITION STATUS      Incontinent Diet (regular diet)  AMBULATORY STATUS COMMUNICATION OF NEEDS Skin   Extensive Assist Verbally Surgical wounds (xeroform gauze to incision, cover with ABD pad or 4x4 gauze and wrap with kerlix- change daily)                       Personal Care Assistance Level of Assistance  Bathing, Feeding, Dressing, Total care Bathing Assistance: Limited assistance Feeding assistance: Limited assistance Dressing Assistance: Maximum assistance Total Care Assistance: Limited assistance   Functional Limitations Info             SPECIAL CARE FACTORS FREQUENCY  PT (By licensed PT), OT (By licensed OT)     PT  Frequency: 5 x weekly OT Frequency: 5 x weekly            Contractures      Additional Factors Info  Code Status, Allergies Code Status Info: Full Allergies Info: [place]           Current Medications (07/13/2020):  This is the current hospital active medication list Current Facility-Administered Medications  Medication Dose Route Frequency Provider Last Rate Last Admin   acetaminophen (TYLENOL) tablet 650 mg  650 mg Oral Q6H PRN Athena Masse, MD       Or   acetaminophen (TYLENOL) suppository 650 mg  650 mg Rectal Q6H PRN Athena Masse, MD       carbidopa-levodopa (SINEMET IR) 25-100 MG per tablet immediate release 1.5 tablet  1.5 tablet Oral TID Darliss Cheney, MD   1.5 tablet at 07/13/20 0955   cefTRIAXone (ROCEPHIN) 1 g in sodium chloride 0.9 % 100 mL IVPB  1 g Intravenous q1800 Athena Masse, MD   Stopped at 07/12/20 1851   donepezil (ARICEPT) tablet 5 mg  5 mg Oral QHS Athena Masse, MD   5 mg at 07/12/20 2139   liver oil-zinc oxide (DESITIN) 40 % ointment   Topical BID Darliss Cheney, MD   Given at 07/13/20 0955   melatonin tablet 5 mg  5 mg Oral QHS Judd Gaudier V, MD   5 mg at 07/12/20 2139   ondansetron (ZOFRAN) tablet 4 mg  4 mg Oral Q6H PRN Athena Masse,  MD   4 mg at 07/12/20 0440   Or   ondansetron (ZOFRAN) injection 4 mg  4 mg Intravenous Q6H PRN Athena Masse, MD       pantoprazole (PROTONIX) EC tablet 40 mg  40 mg Oral Daily Judd Gaudier V, MD   40 mg at 07/13/20 0955   QUEtiapine (SEROQUEL) tablet 25 mg  25 mg Oral QHS Athena Masse, MD   25 mg at 07/12/20 2139   rOPINIRole (REQUIP XL) 24 hr tablet 8 mg  8 mg Oral QHS Athena Masse, MD   8 mg at 07/12/20 2139   tamsulosin (FLOMAX) capsule 0.4 mg  0.4 mg Oral Daily Darliss Cheney, MD   0.4 mg at 07/13/20 0165     Discharge Medications: Please see discharge summary for a list of discharge medications.  Relevant Imaging Results:  Relevant Lab Results:   Additional  Information SS # [id number]  Shelbie Hutching, RN

## 2020-07-13 NOTE — TOC Initial Note (Signed)
Transition of Care (TOC) - Initial/Assessment Note    Patient Details  Name: Wesley Anderson. MRN: [id number] Date of Birth: 1946/06/26  Transition of Care [facility]) CM/SW Contact:    Shelbie Hutching, RN Phone Number: 07/13/2020, 4:10 PM  Clinical Narrative:                 Patient placed under observation for confusion and altered mental status, patient has a history of Parkinson's with electronic brain stimulator.  Patient is from home with his wife.  PT has recommended SNF and family agrees with recommendation.  [facility] offered a bed and has agreed for the family to private pay until insurance Josem Kaufmann is approved.  Plan for discharge tomorrow.  PT needs to work with patient again this afternoon.  Patient's son Jacqulynn Cadet updated on plan of care.    Expected Discharge Plan: Skilled Nursing Facility Barriers to Discharge: Insurance Authorization   Patient Goals and CMS Choice Patient states their goals for this hospitalization and ongoing recovery are:: Patient and family are ready to discharge and go to [facility] CMS Medicare.gov Compare Post Acute Care list provided to:: Patient Represenative (must comment) Choice offered to / list presented to : Adult Children, Spouse  Expected Discharge Plan and Services Expected Discharge Plan: [place]   Discharge Planning Services: CM Consult Post Acute Care Choice: [place] Living arrangements for the past 2 months: Single Family Home Expected Discharge Date: 07/13/20               DME Arranged: N/A DME Agency: NA       HH Arranged: NA          Prior Living Arrangements/Services Living arrangements for the past 2 months: Single Family Home Lives with:: Self, Spouse Patient language and need for interpreter reviewed:: Yes Do you feel safe going back to the place where you live?: Yes      Need for Family Participation in Patient Care: Yes (Comment) (Parkinson's) Care giver support system in place?: Yes  (comment) (wife and son)   Criminal Activity/Legal Involvement Pertinent to Current Situation/Hospitalization: No - Comment as needed  Activities of Daily Living Home Assistive Devices/Equipment: Environmental consultant (specify type) ADL Screening (condition at time of admission) Patient's cognitive ability adequate to safely complete daily activities?: Yes Is the patient deaf or have difficulty hearing?: No Does the patient have difficulty seeing, even when wearing glasses/contacts?: No Does the patient have difficulty concentrating, remembering, or making decisions?: No Patient able to express need for assistance with ADLs?: Yes Does the patient have difficulty dressing or bathing?: Yes Independently performs ADLs?: No Communication: Needs assistance Is this a change from baseline?: Pre-admission baseline Dressing (OT): Needs assistance Is this a change from baseline?: Pre-admission baseline Grooming: Needs assistance Is this a change from baseline?: Pre-admission baseline Feeding: Independent Bathing: Needs assistance Is this a change from baseline?: Pre-admission baseline Toileting: Needs assistance Is this a change from baseline?: Pre-admission baseline In/Out Bed: Needs assistance Is this a change from baseline?: Pre-admission baseline Walks in Home: Needs assistance Is this a change from baseline?: Pre-admission baseline Does the patient have difficulty walking or climbing stairs?: Yes Weakness of Legs: None Weakness of Arms/Hands: Right  Permission Sought/Granted Permission sought to share information with : Case Manager, Family Supports, Chartered certified accountant granted to share information with : Yes, Verbal Permission Granted  Share Information with NAME: Jacqulynn Cadet  Permission granted to share info w AGENCY: [facility]  Permission granted to share info  w Relationship: son     Emotional Assessment       Orientation: : Oriented to Self, Oriented to Place, Oriented to  Situation Alcohol / Substance Use: Not Applicable Psych Involvement: No (comment)  Admission diagnosis:  Acute encephalopathy [G93.40] AKI (acute kidney injury) ([place]) [N17.9] Altered mental status, unspecified altered mental status type [U40.45] Acute metabolic encephalopathy [V13.68] Patient Active Problem List   Diagnosis Date Noted  . Acute metabolic encephalopathy 59/92/3414  . AKI (acute kidney injury) ([place]) 07/09/2020  . Leukocytosis 07/09/2020  . S/P deep brain stimulator placement 07/09/2020  . Squamous cell carcinoma, 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 04/24/2020  . Cognitive impairment 02/22/2013  . Parkinson's disease ([place]) 09/28/2011   PCP:  Adin Hector, MD Pharmacy:   [id number] - [place], [place] [address] [place] [address] Phone: [phone] Fax: [phone]     Social Determinants of Health (SDOH) Interventions    Readmission Risk Interventions No flowsheet data found.

## 2020-07-14 DIAGNOSIS — G9341 Metabolic encephalopathy: Secondary | ICD-10-CM | POA: Diagnosis not present

## 2020-07-14 MED ORDER — QUETIAPINE FUMARATE 25 MG PO TABS: 25.0000 mg | ORAL_TABLET | Freq: Every day | ORAL | 0 refills | Status: DC

## 2020-07-14 MED ORDER — CARBIDOPA-LEVODOPA 25-100 MG PO TABS: 1.5000 | ORAL_TABLET | Freq: Three times a day (TID) | ORAL | 0 refills | Status: DC

## 2020-07-14 NOTE — TOC Transition Note (Signed)
Transition of Care [facility]) - CM/SW Discharge Note   Patient Details  Name: Wesley Anderson. MRN: [id number] Date of Birth: 06-23-1946  Transition of Care [facility]) CM/SW Contact:  Shelbie Hutching, RN Phone Number: 07/14/2020, 1:20 PM   Clinical Narrative:    [facility] denied insurance authorization.  MD will do a peer to peer.  Plan is still for patient to go to [facility] private pay.  If after peer to peer [facility] still denies the family is aware that they can appeal.     Final next level of care: Skilled Nursing Facility Barriers to Discharge: Barriers Resolved   Patient Goals and CMS Choice Patient states their goals for this hospitalization and ongoing recovery are:: Patient and family are ready to discharge and go to [facility] CMS Medicare.gov Compare Post Acute Care list provided to:: Patient Represenative (must comment) Choice offered to / list presented to : Adult Children, Spouse  Discharge Placement              Patient chooses bed at: [facility] [place] Patient to be transferred to facility by: [facility] Name of family member notified: Vaughan Basta Wife Patient and family notified of of transfer: 07/14/20  Discharge Plan and Services   Discharge Planning Services: CM Consult Post Acute Care Choice: [place]          DME Arranged: N/A DME Agency: NA       HH Arranged: NA          Social Determinants of Health (SDOH) Interventions     Readmission Risk Interventions No flowsheet data found.

## 2020-07-14 NOTE — Care Management Obs Status (Signed)
[place] NOTIFICATION   Patient Details  Name: Wesley Anderson. MRN: [id number] Date of Birth: May 29, 1946   Medicare Observation Status Notification Given:  Yes    Shelbie Hutching, RN 07/14/2020, 10:45 AM

## 2020-07-14 NOTE — Progress Notes (Signed)
Patient is being discharged to [facility] room 504-A. I have tried calling facility multiple times with no one picking up when their secretary transfers the call. Patient being transferred via [facility].

## 2020-07-14 NOTE — TOC Transition Note (Signed)
Transition of Care [facility]) - CM/SW Discharge Note   Patient Details  Name: Wesley Anderson. MRN: [id number] Date of Birth: 20-Jul-1946  Transition of Care [facility]) CM/SW Contact:  Shelbie Hutching, RN Phone Number: 07/14/2020, 12:08 PM   Clinical Narrative:    Patient is ready for discharge to [facility].  Patient will be going to room 711.  Bedside RN will call report to [phone].  Transport has been arranged with Hospital doctor for 1345.  Family is aware of discharge plans for today.    Final next level of care: Skilled Nursing Facility Barriers to Discharge: Barriers Resolved   Patient Goals and CMS Choice Patient states their goals for this hospitalization and ongoing recovery are:: Patient and family are ready to discharge and go to [facility] Medicare.gov Compare Post Acute Care list provided to:: Patient Represenative (must comment) Choice offered to / list presented to : Adult Children, Spouse  Discharge Placement              Patient chooses bed at: [facility] [place] Patient to be transferred to facility by: [facility] Name of family member notified: Vaughan Basta Wife Patient and family notified of of transfer: 07/14/20  Discharge Plan and Services   Discharge Planning Services: CM Consult Post Acute Care Choice: [place]          DME Arranged: N/A DME Agency: NA       HH Arranged: NA          Social Determinants of Health (SDOH) Interventions     Readmission Risk Interventions No flowsheet data found.

## 2020-07-14 NOTE — Discharge Summary (Signed)
Physician Discharge Summary  Wesley Anderson. TFT:[id number] DOB: 10-16-1945 DOA: 07/09/2020  PCP: Adin Hector, MD  Admit date: 07/09/2020 Discharge date: 07/14/2020  Admitted From: Home Disposition: SNF  Recommendations for Outpatient Follow-up:  1. Follow up with PCP in 1-2 weeks 2. Please obtain BMP/CBC in one week 3. Please follow up with your PCP on the following pending results: Unresulted Labs (From admission, onward)         None       Home Health: None Equipment/Devices: None  Discharge Condition: Stable CODE STATUS: Full code Diet recommendation: Cardiac  Subjective: Seen and examined.  Patient alert and oriented but very weak.  No complaints.  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  According to his wife,his confusion appeared to have improved however over the past 24 to 48 hours they have noted increasing confusion and 'talking out of his head'.His wife states that he seems to be seeing things, was unable to recognize family members, he was stating that his family members will robots trying to give him injections as well as several other things of bizarre content. She states that while hospitalized he was seen by a psychiatrist and started on Seroquel and after seeing his neurologist a few days after, he had his amantadine discontinued and his carbidopa dose increased and was also started on melatonin and Aricept. His neurologist did blood work to include B12, and TSH both of which were normal on Care Everywhere.  Wife states she has been having difficulty giving him his medication including the antibiotics prescribed at discharge and his other meds.  No other complaint  On arrival, patient was afebrile, heart rate 81, respiration 26 with O2 sat 94% on room air. Blood pressure 105/62. Blood work significant for WBC of 18,000, and creatinine of 1.84 up from baseline of 1.1 on Care Everywhere. Bilirubin slightly elevated at 3. Urinalysis without pyuria but with ketones. Lactic acid 1. Troponin 10 Chest x-ray with no acute findings CT head no acute intracranial findings EKG Normal sinus rhythm Patient was treated with an IV fluid bolus.   Admitted under hospital service due to acute encephalopathy.  Patient's encephalopathy almost resolved within 24 hours.  Patient's family was reluctant to take him home as he was little aggressive and agitated at home and his wife was not comfortable taking him back home and requested discharging him to skilled nursing facility.  He was assessed by PT OT and they also recommended a skilled nursing facility.  Patient waited in the hospital for 3 days while TOC was finding a bed.  He was given IV Rocephin here to complete the course of 7 days of IV antibiotics as recommended by his physician before.  Patient remained alert and mostly oriented with some intermittent confusion.  His acute encephalopathy seems to be a combination of delirium, Parkinson's disease and toxic encephalopathy.  He also came in with dehydration which caused AKI.  He was given IV fluids.  Dehydration and AKI resolved.  He also had diarrhea but he was tested negative for C. difficile as well as GI pathogen panel.  Electrolytes were replaced.  He is  being discharged in stable condition.  Discharge Diagnoses:  Principal Problem:   Acute metabolic encephalopathy Active Problems:   Parkinson's disease ([place])   Cognitive impairment   AKI (acute kidney injury) ([place])   Leukocytosis   S/P deep brain  stimulator placement   Squamous cell carcinoma, 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    Discharge Instructions  Discharge Instructions    Discharge patient   Complete by: As directed    Discharge disposition: 03-Skilled [place]   Discharge patient date: 07/12/2020   Discharge patient   Complete by: As directed    Discharge disposition: 03-Skilled [place]   Discharge patient date: 07/13/2020   Discharge patient   Complete by: As directed    Discharge disposition: 03-Skilled [place]   Discharge patient date: 07/14/2020     Allergies as of 07/14/2020      Reactions   Artane [trihexyphenidyl] Anxiety      Medication List    STOP taking these medications   amantadine 100 MG capsule Commonly known as: SYMMETREL   cephALEXin 500 MG capsule Commonly known as: KEFLEX   HYDROcodone-acetaminophen 5-325 MG tablet Commonly known as: NORCO/VICODIN     TAKE these medications   bacitracin 500 UNIT/GM ointment Apply 1 application topically 2 (two) times daily.   carbidopa-levodopa 25-100 MG tablet Commonly known as: SINEMET IR Take 1.5 tablets by mouth 3 (three) times daily. What changed: Another medication with the same name was removed. Continue taking this medication, and follow the directions you see here.   donepezil 10 MG tablet Commonly known as: ARICEPT Take 5 mg by mouth at bedtime.   donepezil 10 MG tablet Commonly known as: ARICEPT Take 10 mg by mouth daily. Start taking on: August 06, 2020   multivitamin tablet Take 1 tablet by mouth daily.   pantoprazole 40 MG tablet Commonly known as: PROTONIX Take 40 mg by mouth daily.   QUEtiapine 25 MG tablet Commonly known as: SEROQUEL Take 1 tablet (25 mg total) by mouth at bedtime. What changed:   how much to take  when to take this  reasons to take this   rOPINIRole 8 MG 24 hr tablet Commonly known as: REQUIP XL Take 8 mg by mouth at bedtime.   sildenafil 100 MG  tablet Commonly known as: VIAGRA Take 100 mg by mouth daily as needed for erectile dysfunction.   tamsulosin 0.4 MG Caps capsule Commonly known as: FLOMAX Take 0.4 mg by mouth at bedtime. Take 30 minutes after the same meal each day.   urea 40 % Crea Commonly known as: CARMOL Apply topically daily.       Follow-up Information    Tama High III, MD Follow up in 1 week(s).   Specialty: Internal Medicine Contact information: [address] [facility] [place] [address] [phone]              Allergies  Allergen Reactions  . Artane [Trihexyphenidyl] Anxiety    Consultations: None   Procedures/Studies: CT Head Wo Contrast  Result Date: 07/09/2020 CLINICAL DATA:  Mental status change, skin cancer lesion removed from head EXAM: CT HEAD WITHOUT CONTRAST TECHNIQUE: Contiguous axial images were obtained from the base of the skull through the vertex without intravenous contrast. COMPARISON:  CT brain 12/20/2019 FINDINGS: Brain: No acute territorial infarction, hemorrhage, or intracranial mass. Mild atrophy. Mild hypodensity in the white matter presumably chronic small vessel ischemic change. Stable ventricle size. Right transfrontal deep brain stimulator lead. Vascular: No hyperdense vessels.  Carotid vascular calcification. Skull: Right frontal burr hole.  No fracture Sinuses/Orbits: No acute finding. Other: Scalp defect with packing material at the cranial vertex anteriorly presumably corresponding to history of skin lesion removal IMPRESSION: 1. No CT evidence for acute intracranial abnormality. 2. Atrophy and mild chronic small vessel ischemic changes of the white matter. Electronically Signed   By: Donavan Foil M.D.   On: 07/09/2020 22:46   DG Chest Portable 1 View  Result Date: 07/09/2020 CLINICAL DATA:  Weakness EXAM: PORTABLE CHEST 1 VIEW COMPARISON:  None. FINDINGS: A stimulator pack is noted projecting over the right chest wall. The heart size is  mildly enlarged. Aortic calcifications are noted. There is no pneumothorax. No large pleural effusion. Streaky bibasilar airspace opacities are noted favored to represent areas of atelectasis and scarring. IMPRESSION: 1. No acute cardiopulmonary process. 2. Bibasilar atelectasis and scarring. 3. Mild cardiomegaly. 4. Aortic calcifications. Electronically Signed   By: Constance Holster M.D.   On: 07/09/2020 22:12     Discharge Exam: Vitals:   07/14/20 0015 07/14/20 0634  BP: (!) 141/78 (!) 143/92  Pulse: 63 60  Resp: 18 16  Temp: 98.5 F (36.9 C) 98.3 F (36.8 C)  SpO2: 98% 95%   Vitals:   07/13/20 1545 07/13/20 2022 07/14/20 0015 07/14/20 0634  BP: (!) 141/73 125/79 (!) 141/78 (!) 143/92  Pulse: 70 78 63 60  Resp: 18 20 18 16   Temp: 98.6 F (37 C) (!) 97.5 F (36.4 C) 98.5 F (36.9 C) 98.3 F (36.8 C)  TempSrc:   Oral Oral  SpO2: 99% 98% 98% 95%  Weight:      Height:        General: Pt is alert, awake, not in acute distress Cardiovascular: RRR, S1/S2 +, no rubs, no gallops Respiratory: CTA bilaterally, no wheezing, no rhonchi Abdominal: Soft, NT, ND, bowel sounds + Extremities: no edema, no cyanosis    The results of significant diagnostics from this hospitalization (including imaging, microbiology, ancillary and laboratory) are listed below for reference.     Microbiology: Recent Results (from the past 240 hour(s))  Resp Panel by RT-PCR (Flu A&B, Covid) Nasopharyngeal Swab     Status: None   Collection Time: 07/09/20 10:51 PM   Specimen: Nasopharyngeal Swab; Nasopharyngeal(NP) swabs in vial transport medium  Result Value Ref Range Status   SARS Coronavirus 2 by RT PCR NEGATIVE NEGATIVE Final    Comment: (NOTE) SARS-CoV-2 target nucleic acids are NOT DETECTED.  The SARS-CoV-2 RNA is generally detectable in upper respiratory specimens during the acute phase of infection. The lowest concentration of SARS-CoV-2 viral copies this assay can detect is 138 copies/mL.  A negative result does not preclude SARS-Cov-2 infection and should not be used as the sole basis for treatment or other patient management decisions. A negative result may occur with  improper specimen collection/handling, submission of specimen other than nasopharyngeal swab, presence of viral mutation(s) within the areas targeted by this assay, and inadequate number of viral copies(<138 copies/mL). A negative result must be combined with clinical observations, patient history, and epidemiological information. The expected result is Negative.  Fact Sheet for Patients:  EntrepreneurPulse.com.au  Fact Sheet for Healthcare Providers:  IncredibleEmployment.be  This test is no t yet approved or cleared by the [place] FDA and  has been authorized for detection and/or diagnosis of SARS-CoV-2 by FDA under an Emergency Use Authorization (EUA). This EUA will remain  in effect (meaning this test can be used) for the duration of the  COVID-19 declaration under Section 564(b)(1) of the Act, 21 U.S.C.section 360bbb-3(b)(1), unless the authorization is terminated  or revoked sooner.       Influenza A by PCR NEGATIVE NEGATIVE Final   Influenza B by PCR NEGATIVE NEGATIVE Final    Comment: (NOTE) The Xpert Xpress SARS-CoV-2/FLU/RSV plus assay is intended as an aid in the diagnosis of influenza from Nasopharyngeal swab specimens and should not be used as a sole basis for treatment. Nasal washings and aspirates are unacceptable for Xpert Xpress SARS-CoV-2/FLU/RSV testing.  Fact Sheet for Patients: EntrepreneurPulse.com.au  Fact Sheet for Healthcare Providers: IncredibleEmployment.be  This test is not yet approved or cleared by the [place] FDA and has been authorized for detection and/or diagnosis of SARS-CoV-2 by FDA under an Emergency Use Authorization (EUA). This EUA will remain in effect (meaning this test  can be used) for the duration of the COVID-19 declaration under Section 564(b)(1) of the Act, 21 U.S.C. section 360bbb-3(b)(1), unless the authorization is terminated or revoked.  Performed at [facility], [address] [place], [place] [address]   C Difficile Quick Screen w PCR reflex     Status: None   Collection Time: 07/10/20  1:01 PM   Specimen: STOOL  Result Value Ref Range Status   C Diff antigen NEGATIVE NEGATIVE Final   C Diff toxin NEGATIVE NEGATIVE Final   C Diff interpretation No C. difficile detected.  Final    Comment: Performed at [facility], [address] [place], [place] [address]  Gastrointestinal Panel by PCR , Stool     Status: None   Collection Time: 07/10/20  1:01 PM   Specimen: STOOL  Result Value Ref Range Status   Campylobacter species NOT DETECTED NOT DETECTED Final   Plesimonas shigelloides NOT DETECTED NOT DETECTED Final   Salmonella species NOT DETECTED NOT DETECTED Final   Yersinia enterocolitica NOT DETECTED NOT DETECTED Final   Vibrio species NOT DETECTED NOT DETECTED Final   Vibrio cholerae NOT DETECTED NOT DETECTED Final   Enteroaggregative E coli (EAEC) NOT DETECTED NOT DETECTED Final   Enteropathogenic E coli (EPEC) NOT DETECTED NOT DETECTED Final   Enterotoxigenic E coli (ETEC) NOT DETECTED NOT DETECTED Final   Shiga like toxin producing E coli (STEC) NOT DETECTED NOT DETECTED Final   Shigella/Enteroinvasive E coli (EIEC) NOT DETECTED NOT DETECTED Final   Cryptosporidium NOT DETECTED NOT DETECTED Final   Cyclospora cayetanensis NOT DETECTED NOT DETECTED Final   Entamoeba histolytica NOT DETECTED NOT DETECTED Final   Giardia lamblia NOT DETECTED NOT DETECTED Final   Adenovirus F40/41 NOT DETECTED NOT DETECTED Final   Astrovirus NOT DETECTED NOT DETECTED Final   Norovirus GI/GII NOT DETECTED NOT DETECTED Final   Rotavirus A NOT DETECTED NOT DETECTED Final   Sapovirus (I, II, IV, and V) NOT DETECTED NOT DETECTED Final     Comment: Performed at [facility], [address] [place], [place] [address]  Resp Panel by RT-PCR (Flu A&B, Covid) Nasopharyngeal Swab     Status: None   Collection Time: 07/13/20  6:52 PM   Specimen: Nasopharyngeal Swab; Nasopharyngeal(NP) swabs in vial transport medium  Result Value Ref Range Status   SARS Coronavirus 2 by RT PCR NEGATIVE NEGATIVE Final    Comment: (NOTE) SARS-CoV-2 target nucleic acids are NOT DETECTED.  The SARS-CoV-2 RNA is generally detectable in upper respiratory specimens during the acute phase of infection. The lowest concentration of SARS-CoV-2 viral copies this assay can detect is 138 copies/mL. A negative  result does not preclude SARS-Cov-2 infection and should not be used as the sole basis for treatment or other patient management decisions. A negative result may occur with  improper specimen collection/handling, submission of specimen other than nasopharyngeal swab, presence of viral mutation(s) within the areas targeted by this assay, and inadequate number of viral copies(<138 copies/mL). A negative result must be combined with clinical observations, patient history, and epidemiological information. The expected result is Negative.  Fact Sheet for Patients:  EntrepreneurPulse.com.au  Fact Sheet for Healthcare Providers:  IncredibleEmployment.be  This test is no t yet approved or cleared by the [place] FDA and  has been authorized for detection and/or diagnosis of SARS-CoV-2 by FDA under an Emergency Use Authorization (EUA). This EUA will remain  in effect (meaning this test can be used) for the duration of the COVID-19 declaration under Section 564(b)(1) of the Act, 21 U.S.C.section 360bbb-3(b)(1), unless the authorization is terminated  or revoked sooner.       Influenza A by PCR NEGATIVE NEGATIVE Final   Influenza B by PCR NEGATIVE NEGATIVE Final    Comment: (NOTE) The Xpert Xpress  SARS-CoV-2/FLU/RSV plus assay is intended as an aid in the diagnosis of influenza from Nasopharyngeal swab specimens and should not be used as a sole basis for treatment. Nasal washings and aspirates are unacceptable for Xpert Xpress SARS-CoV-2/FLU/RSV testing.  Fact Sheet for Patients: EntrepreneurPulse.com.au  Fact Sheet for Healthcare Providers: IncredibleEmployment.be  This test is not yet approved or cleared by the [place] FDA and has been authorized for detection and/or diagnosis of SARS-CoV-2 by FDA under an Emergency Use Authorization (EUA). This EUA will remain in effect (meaning this test can be used) for the duration of the COVID-19 declaration under Section 564(b)(1) of the Act, 21 U.S.C. section 360bbb-3(b)(1), unless the authorization is terminated or revoked.  Performed at [facility], [address] [place], [place] [address]      Labs: BNP (last 3 results) No results for input(s): BNP in the last 8760 hours. Basic Metabolic Panel: Recent Labs  Lab 07/09/20 2140 07/10/20 0811 07/11/20 0512 07/12/20 0540 07/13/20 0449  NA 139 138 138 138 137  K 3.5 3.6 3.4* 3.3* 3.6  CL 105 108 111 112* 107  CO2 22 21* 20* 19* 22  GLUCOSE 135* 127* 100* 112* 96  BUN 36* 30* 23 13 12   CREATININE 1.84* 1.27* 0.94 0.78 0.95  CALCIUM 9.7 9.3 8.7* 8.4* 8.5*  MG  --  2.1  --   --   --    Liver Function Tests: Recent Labs  Lab 07/09/20 2140 07/10/20 0811  AST 28 20  ALT 29 29  ALKPHOS 80 69  BILITOT 3.0* 2.8*  PROT 8.1 6.8  ALBUMIN 4.4 3.5   No results for input(s): LIPASE, AMYLASE in the last 168 hours. Recent Labs  Lab 07/10/20 0811  AMMONIA 18   CBC: Recent Labs  Lab 07/09/20 2140 07/10/20 0811 07/11/20 0512 07/12/20 0540  WBC 18.1* 11.2* 8.0 8.1  NEUTROABS  --  9.1* 5.9 6.1  HGB 15.3 14.0 12.5* 12.5*  HCT 46.0 41.5 36.9* 36.3*  MCV 93.1 91.2 91.8 90.8  PLT 218 194 183 192   Cardiac  Enzymes: No results for input(s): CKTOTAL, CKMB, CKMBINDEX, TROPONINI in the last 168 hours. BNP: Invalid input(s): POCBNP CBG: Recent Labs  Lab 07/12/20 1307  GLUCAP 108*   D-Dimer No results for input(s): DDIMER in the last 72 hours. Hgb A1c No results for input(s): HGBA1C in  the last 72 hours. Lipid Profile No results for input(s): CHOL, HDL, LDLCALC, TRIG, CHOLHDL, LDLDIRECT in the last 72 hours. Thyroid function studies No results for input(s): TSH, T4TOTAL, T3FREE, THYROIDAB in the last 72 hours.  Invalid input(s): FREET3 Anemia work up No results for input(s): VITAMINB12, FOLATE, FERRITIN, TIBC, IRON, RETICCTPCT in the last 72 hours. Urinalysis    Component Value Date/Time   COLORURINE AMBER (A) 07/09/2020 2251   APPEARANCEUR HAZY (A) 07/09/2020 2251   LABSPEC 1.025 07/09/2020 2251   PHURINE 5.0 07/09/2020 2251   GLUCOSEU NEGATIVE 07/09/2020 2251   HGBUR NEGATIVE 07/09/2020 2251   BILIRUBINUR NEGATIVE 07/09/2020 2251   KETONESUR 5 (A) 07/09/2020 2251   PROTEINUR 30 (A) 07/09/2020 2251   NITRITE NEGATIVE 07/09/2020 2251   LEUKOCYTESUR TRACE (A) 07/09/2020 2251   Sepsis Labs Invalid input(s): PROCALCITONIN,  WBC,  LACTICIDVEN Microbiology Recent Results (from the past 240 hour(s))  Resp Panel by RT-PCR (Flu A&B, Covid) Nasopharyngeal Swab     Status: None   Collection Time: 07/09/20 10:51 PM   Specimen: Nasopharyngeal Swab; Nasopharyngeal(NP) swabs in vial transport medium  Result Value Ref Range Status   SARS Coronavirus 2 by RT PCR NEGATIVE NEGATIVE Final    Comment: (NOTE) SARS-CoV-2 target nucleic acids are NOT DETECTED.  The SARS-CoV-2 RNA is generally detectable in upper respiratory specimens during the acute phase of infection. The lowest concentration of SARS-CoV-2 viral copies this assay can detect is 138 copies/mL. A negative result does not preclude SARS-Cov-2 infection and should not be used as the sole basis for treatment or other patient  management decisions. A negative result may occur with  improper specimen collection/handling, submission of specimen other than nasopharyngeal swab, presence of viral mutation(s) within the areas targeted by this assay, and inadequate number of viral copies(<138 copies/mL). A negative result must be combined with clinical observations, patient history, and epidemiological information. The expected result is Negative.  Fact Sheet for Patients:  EntrepreneurPulse.com.au  Fact Sheet for Healthcare Providers:  IncredibleEmployment.be  This test is no t yet approved or cleared by the [place] FDA and  has been authorized for detection and/or diagnosis of SARS-CoV-2 by FDA under an Emergency Use Authorization (EUA). This EUA will remain  in effect (meaning this test can be used) for the duration of the COVID-19 declaration under Section 564(b)(1) of the Act, 21 U.S.C.section 360bbb-3(b)(1), unless the authorization is terminated  or revoked sooner.       Influenza A by PCR NEGATIVE NEGATIVE Final   Influenza B by PCR NEGATIVE NEGATIVE Final    Comment: (NOTE) The Xpert Xpress SARS-CoV-2/FLU/RSV plus assay is intended as an aid in the diagnosis of influenza from Nasopharyngeal swab specimens and should not be used as a sole basis for treatment. Nasal washings and aspirates are unacceptable for Xpert Xpress SARS-CoV-2/FLU/RSV testing.  Fact Sheet for Patients: EntrepreneurPulse.com.au  Fact Sheet for Healthcare Providers: IncredibleEmployment.be  This test is not yet approved or cleared by the [place] FDA and has been authorized for detection and/or diagnosis of SARS-CoV-2 by FDA under an Emergency Use Authorization (EUA). This EUA will remain in effect (meaning this test can be used) for the duration of the COVID-19 declaration under Section 564(b)(1) of the Act, 21 U.S.C. section 360bbb-3(b)(1),  unless the authorization is terminated or revoked.  Performed at [facility], [address] [place], [place] [address]   C Difficile Quick Screen w PCR reflex     Status: None   Collection Time: 07/10/20  1:01 PM   Specimen: STOOL  Result Value Ref Range Status   C Diff antigen NEGATIVE NEGATIVE Final   C Diff toxin NEGATIVE NEGATIVE Final   C Diff interpretation No C. difficile detected.  Final    Comment: Performed at [facility], [address] [place], [place] [address]  Gastrointestinal Panel by PCR , Stool     Status: None   Collection Time: 07/10/20  1:01 PM   Specimen: STOOL  Result Value Ref Range Status   Campylobacter species NOT DETECTED NOT DETECTED Final   Plesimonas shigelloides NOT DETECTED NOT DETECTED Final   Salmonella species NOT DETECTED NOT DETECTED Final   Yersinia enterocolitica NOT DETECTED NOT DETECTED Final   Vibrio species NOT DETECTED NOT DETECTED Final   Vibrio cholerae NOT DETECTED NOT DETECTED Final   Enteroaggregative E coli (EAEC) NOT DETECTED NOT DETECTED Final   Enteropathogenic E coli (EPEC) NOT DETECTED NOT DETECTED Final   Enterotoxigenic E coli (ETEC) NOT DETECTED NOT DETECTED Final   Shiga like toxin producing E coli (STEC) NOT DETECTED NOT DETECTED Final   Shigella/Enteroinvasive E coli (EIEC) NOT DETECTED NOT DETECTED Final   Cryptosporidium NOT DETECTED NOT DETECTED Final   Cyclospora cayetanensis NOT DETECTED NOT DETECTED Final   Entamoeba histolytica NOT DETECTED NOT DETECTED Final   Giardia lamblia NOT DETECTED NOT DETECTED Final   Adenovirus F40/41 NOT DETECTED NOT DETECTED Final   Astrovirus NOT DETECTED NOT DETECTED Final   Norovirus GI/GII NOT DETECTED NOT DETECTED Final   Rotavirus A NOT DETECTED NOT DETECTED Final   Sapovirus (I, II, IV, and V) NOT DETECTED NOT DETECTED Final    Comment: Performed at [facility], [address] [place], [place] [address]  Resp Panel by RT-PCR (Flu A&B,  Covid) Nasopharyngeal Swab     Status: None   Collection Time: 07/13/20  6:52 PM   Specimen: Nasopharyngeal Swab; Nasopharyngeal(NP) swabs in vial transport medium  Result Value Ref Range Status   SARS Coronavirus 2 by RT PCR NEGATIVE NEGATIVE Final    Comment: (NOTE) SARS-CoV-2 target nucleic acids are NOT DETECTED.  The SARS-CoV-2 RNA is generally detectable in upper respiratory specimens during the acute phase of infection. The lowest concentration of SARS-CoV-2 viral copies this assay can detect is 138 copies/mL. A negative result does not preclude SARS-Cov-2 infection and should not be used as the sole basis for treatment or other patient management decisions. A negative result may occur with  improper specimen collection/handling, submission of specimen other than nasopharyngeal swab, presence of viral mutation(s) within the areas targeted by this assay, and inadequate number of viral copies(<138 copies/mL). A negative result must be combined with clinical observations, patient history, and epidemiological information. The expected result is Negative.  Fact Sheet for Patients:  EntrepreneurPulse.com.au  Fact Sheet for Healthcare Providers:  IncredibleEmployment.be  This test is no t yet approved or cleared by the [place] FDA and  has been authorized for detection and/or diagnosis of SARS-CoV-2 by FDA under an Emergency Use Authorization (EUA). This EUA will remain  in effect (meaning this test can be used) for the duration of the COVID-19 declaration under Section 564(b)(1) of the Act, 21 U.S.C.section 360bbb-3(b)(1), unless the authorization is terminated  or revoked sooner.       Influenza A by PCR NEGATIVE NEGATIVE Final   Influenza B by PCR NEGATIVE NEGATIVE Final    Comment: (NOTE) The Xpert Xpress SARS-CoV-2/FLU/RSV plus assay is intended as an aid in the diagnosis of  influenza from Nasopharyngeal swab specimens and should  not be used as a sole basis for treatment. Nasal washings and aspirates are unacceptable for Xpert Xpress SARS-CoV-2/FLU/RSV testing.  Fact Sheet for Patients: EntrepreneurPulse.com.au  Fact Sheet for Healthcare Providers: IncredibleEmployment.be  This test is not yet approved or cleared by the [place] FDA and has been authorized for detection and/or diagnosis of SARS-CoV-2 by FDA under an Emergency Use Authorization (EUA). This EUA will remain in effect (meaning this test can be used) for the duration of the COVID-19 declaration under Section 564(b)(1) of the Act, 21 U.S.C. section 360bbb-3(b)(1), unless the authorization is terminated or revoked.  Performed at [facility], [address], [place], [place] [address]      Time coordinating discharge: Over 30 minutes  SIGNED:   Darliss Cheney, MD  [facility] 07/14/2020, 8:25 AM  If 7PM-7AM, please contact night-coverage [email or web address]

## 2021-06-23 ENCOUNTER — Ambulatory Visit: Payer: Medicare HMO | Attending: Neurology | Admitting: Physical Therapy

## 2021-06-23 ENCOUNTER — Other Ambulatory Visit: Payer: Self-pay

## 2021-06-23 VITALS — BP 116/67 | HR 73

## 2021-06-23 DIAGNOSIS — R262 Difficulty in walking, not elsewhere classified: Secondary | ICD-10-CM | POA: Insufficient documentation

## 2021-06-23 DIAGNOSIS — R293 Abnormal posture: Secondary | ICD-10-CM | POA: Diagnosis present

## 2021-06-23 NOTE — Therapy (Addendum)
[facility] [address], [place], [address] Phone: [phone]   Fax:  [phone]  Physical Therapy Evaluation  Patient Details  Name: Wesley Anderson. MRN: [id number] Date of Birth: 02/23/1946 Referring Provider (PT): Red Christians, PA-C   Encounter Date: 06/23/2021   PT End of Session - 06/24/21 1105     Visit Number 1    Number of Visits 16    Date for PT Re-Evaluation 08/19/21    PT Start Time 0071    PT Stop Time 1630    PT Time Calculation (min) 45 min    Equipment Utilized During Treatment Gait belt    Activity Tolerance Patient tolerated treatment well    Behavior During Therapy Flat affect;WFL for tasks assessed/performed             Past Medical History:  Diagnosis Date   AAA (abdominal aortic aneurysm) ([place])    Alcohol dependence in remission ([place])    Cancer ([place])    skin    Colon polyps    ED (erectile dysfunction)    GERD (gastroesophageal reflux disease)    Hematest positive stools    Melanoma of skin ([place])    Parkinson's disease ([place])    Renal cyst, right    Restless leg syndrome     Past Surgical History:  Procedure Laterality Date   CHOLECYSTECTOMY     COLON SURGERY     colectomy with anastamosis   COLONOSCOPY WITH PROPOFOL N/A 11/07/2016   Procedure: COLONOSCOPY WITH PROPOFOL;  Surgeon: Lollie Sails, MD;  Location: [facility] ENDOSCOPY;  Service: Endoscopy;  Laterality: N/A;   COLONOSCOPY WITH PROPOFOL N/A 09/06/2018   Procedure: COLONOSCOPY WITH PROPOFOL;  Surgeon: Lollie Sails, MD;  Location: [facility] ENDOSCOPY;  Service: Endoscopy;  Laterality: N/A;   COLONOSCOPY WITH PROPOFOL N/A 05/16/2019   Procedure: COLONOSCOPY WITH PROPOFOL;  Surgeon: Lollie Sails, MD;  Location: [facility] ENDOSCOPY;  Service: Endoscopy;  Laterality: N/A;   CYST EXCISION     vocal cord   DBS placement     deep brain implant     ESOPHAGOGASTRODUODENOSCOPY (EGD) WITH PROPOFOL N/A  09/06/2018   Procedure: ESOPHAGOGASTRODUODENOSCOPY (EGD) WITH PROPOFOL;  Surgeon: Lollie Sails, MD;  Location: [facility] ENDOSCOPY;  Service: Endoscopy;  Laterality: N/A;   MELANOMA EXCISION     TONSILLECTOMY      Vitals:   06/23/21 1607  BP: 116/67  Pulse: 73  SpO2: 100%      Subjective Assessment - 06/23/21 1551     Subjective Pt reports that he was diagnosed with Parkinson's about 10 years ago and he believes he has had it for the past 20 years. About 1.5 years ago, he has a fall while playing golf and fell on his right side injuring his right shoulder.    How long can you sit comfortably? N/a    How long can you stand comfortably? d    Patient Stated Goals Wants to work on balance so that he does not fall. He wants to be steady enough to play golf.    Currently in Pain? No/denies                 NEUROMUSCULAR PT EVAL ================================  OBJECTIVE: Veteran ambulates into PT clinic with ()AD, () no AD.  Veteran (x) well-groomed, appropriately dressed, pleasant, and cooperative.  No acute distress noted.  POSTURE: Kyphotic posture-forward flexed posture   ROM: Within Functional limits with exception of decreased  right shoulder flexion and ER due prior RTC injury  STRENGTH: 5/5 for LE   COORDINATION: FNF: RAMs: [x]  WNL     []  Rapid movements are slow, with moderately decreased amplitude, but regular. []  Dysdiadochokinesia        BALANCE    Mini BESTest: 17/32   Score <20/32 signifies a patient at risk for falling Damita Dunnings et al. 2013)    FUNCTIONAL OUTCOME MEASURES:     30 SEC CHAIR STAND: 7   Average for Age =  - A below average score indicates an increased FALL RISK, LE weakness                 - 105-73 year old male <12 reps, male <10 reps           5 Times Sit to Stand: 19 sec  (Individuals with times that exceed the listed time have worse than average performance - predictive of recurrent falls in healthy community-living  subjects)         - 74-69 y.o. 11.4 sec         - 70-79 y.o. 12.6 sec         - 80-89 y.o. 14.8 sec    GAIT ASSESSMENT  Observation: Festinating gait with increased festination when turning. Decreased step length bilaterally and increased double limb support.                      PT Education - 06/24/21 1103     Education Details Explanation of plan of care and focus on balance and explanation of tasks for outcome measures.    Person(s) Educated Patient;Spouse    Methods Explanation;Demonstration;Verbal cues;Handout    Comprehension Verbalized understanding;Returned demonstration;Verbal cues required              PT Short Term Goals - 06/24/21 1120       PT SHORT TERM GOAL #1   Title Patient will demonstrate independence with home exercise program for ability to self treat.    Baseline 11/9: NT    Time 2    Period Weeks    Status New    Target Date 07/08/21               PT Long Term Goals - 06/24/21 1122       PT LONG TERM GOAL #1   Title Patient will have improved function and activity level as evidenced by an increase in FOTO score by 10 points or more.    Baseline 11/9: 41/49    Time 8    Period Weeks    Status New    Target Date 08/18/21      PT LONG TERM GOAL #2   Title Patient will improve 30 sec chair stands to >=11 reps to decrease his risk of falling.    Baseline 11/9: 7 reps    Time 8    Period Weeks    Status New    Target Date 08/19/21      PT LONG TERM GOAL #3   Title Patient will improve Mini-BESTest score >=20 decrease risk of falling and demonstrate a statistically significant improvement in his balance.    Baseline 11/9: 15/32    Time 8    Period Weeks    Status New    Target Date 08/18/21      PT LONG TERM GOAL #4   Title Patient will improve 5 x STS score to <12.6 sec to demonstrate an improvement in  LE strength and a decrease in his risk of falling.    Baseline 11/9: 12.6 sec    Time 8    Period Weeks     Status New    Target Date 08/18/21                    Plan - 06/24/21 1109     Clinical Impression Statement Pt is a 75 yo male that presents for initial eval for imbalance secondary to Parkinson's disease and weakness recovering from recent cancer treatment. Pt presents at increased risk for falls with noticable deficits with reactive postural control and dual tasking. Pt also exhibits an increased risk of falling with decreased LE strength and endurance. He will benefit from skilled PT to increase LE strength and endurance and to improve his balance to decrease his risk of falling and to decrease caregiver burden.    Personal Factors and Comorbidities Age;Comorbidity 2;Time since onset of injury/illness/exacerbation;Past/Current Experience    Comorbidities PD, H/o cancer,    Examination-Activity Limitations Squat;Stairs;Stand    Examination-Participation Restrictions Cleaning;Medication Management;Interpersonal Relationship;Community Activity    Stability/Clinical Decision Making Evolving/Moderate complexity    Clinical Decision Making Moderate    Rehab Potential Fair    PT Frequency 2x / week    PT Duration 8 weeks    PT Treatment/Interventions ADLs/Self Care Home Management;Neuromuscular re-education;Balance training;Therapeutic exercise;Orthotic Fit/Training;Prosthetic Training;Patient/family education;Wheelchair mobility training;Manual techniques;Passive range of motion;Energy conservation;Vestibular;Joint Manipulations;Gait training;Therapeutic activities;Functional mobility training;Stair training    PT Next Visit Plan Begin Axial rotation and static and dynamic balance exercises    PT Home Exercise Plan TBD at next visit    Consulted and Agree with Plan of Care Patient;Family member/caregiver             Patient will benefit from skilled therapeutic intervention in order to improve the following deficits and impairments:  Abnormal gait, Decreased strength,  Hypomobility, Decreased endurance, Decreased range of motion, Impaired flexibility, Postural dysfunction  Visit Diagnosis: Difficulty in walking, not elsewhere classified - Plan: PT plan of care cert/re-cert  Abnormal posture - Plan: PT plan of care cert/re-cert     Problem List Patient Active Problem List   Diagnosis Date Noted   Acute metabolic encephalopathy 60/63/0160   AKI (acute kidney injury) ([place]) 07/09/2020   Leukocytosis 07/09/2020   S/P deep brain stimulator placement 07/09/2020   Squamous cell carcinoma, 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, [place], [address] Phone: [phone]   Fax:  [phone]  Name: Wesley Anderson. MRN: [id number] Date of Birth: [birth year]

## 2021-06-28 ENCOUNTER — Ambulatory Visit: Payer: Medicare HMO | Admitting: Physical Therapy

## 2021-06-28 ENCOUNTER — Encounter: Payer: Self-pay | Admitting: Physical Therapy

## 2021-06-28 DIAGNOSIS — R293 Abnormal posture: Secondary | ICD-10-CM

## 2021-06-28 DIAGNOSIS — R262 Difficulty in walking, not elsewhere classified: Secondary | ICD-10-CM | POA: Diagnosis not present

## 2021-06-28 NOTE — Therapy (Signed)
[facility] [address] [place], [address] Phone: [phone]   Fax:  [phone]  Physical Therapy Treatment  Patient Details  Name: Wesley Anderson. MRN: [id number] Date of Birth: [birth year] Referring Provider (PT): Red Christians, PA-C   Encounter Date: 06/28/2021   PT End of Session - 06/28/21 1556     Visit Number 2    Number of Visits 16    Date for PT Re-Evaluation 08/19/21    PT Start Time 2951    PT Stop Time 1630    PT Time Calculation (min) 45 min    Equipment Utilized During Treatment Gait belt    Activity Tolerance Patient tolerated treatment well    Behavior During Therapy Flat affect;WFL for tasks assessed/performed             Past Medical History:  Diagnosis Date   AAA (abdominal aortic aneurysm)    Alcohol dependence in remission ([place])    Cancer ([place])    skin    Colon polyps    ED (erectile dysfunction)    GERD (gastroesophageal reflux disease)    Hematest positive stools    Melanoma of skin ([place])    Parkinson's disease ([place])    Renal cyst, right    Restless leg syndrome     Past Surgical History:  Procedure Laterality Date   CHOLECYSTECTOMY     COLON SURGERY     colectomy with anastamosis   COLONOSCOPY WITH PROPOFOL N/A 11/07/2016   Procedure: COLONOSCOPY WITH PROPOFOL;  Surgeon: Lollie Sails, MD;  Location: [facility] ENDOSCOPY;  Service: Endoscopy;  Laterality: N/A;   COLONOSCOPY WITH PROPOFOL N/A 09/06/2018   Procedure: COLONOSCOPY WITH PROPOFOL;  Surgeon: Lollie Sails, MD;  Location: [facility] ENDOSCOPY;  Service: Endoscopy;  Laterality: N/A;   COLONOSCOPY WITH PROPOFOL N/A 05/16/2019   Procedure: COLONOSCOPY WITH PROPOFOL;  Surgeon: Lollie Sails, MD;  Location: [facility] ENDOSCOPY;  Service: Endoscopy;  Laterality: N/A;   CYST EXCISION     vocal cord   DBS placement     deep brain implant     ESOPHAGOGASTRODUODENOSCOPY (EGD) WITH PROPOFOL N/A 09/06/2018    Procedure: ESOPHAGOGASTRODUODENOSCOPY (EGD) WITH PROPOFOL;  Surgeon: Lollie Sails, MD;  [facility];  Service: Endoscopy;  Laterality: N/A;   MELANOMA EXCISION     TONSILLECTOMY      There were no vitals filed for this visit.   Subjective Assessment - 06/28/21 1552     Subjective Pt reports that is feeling tired and normally feels tired this time of day.    How long can you sit comfortably? N/a    How long can you stand comfortably? Kyphotic posture    How long can you walk comfortably? Not long. Difficulty with freezing of gait    Patient Stated Goals Wants to work on balance so that he does not fall. He wants to be steady enough to play golf.             THEREX:   Double Hip Rotation 2 x 10  Shoulder Rotation 2 x 10   Modified Thomas Stretch 4 x 60 sec  - min VC to move body closer to edge of bed surface   Sit to Stand 3 x 10  -min VC to perform stand as quickly as possible and to sit slowly   NMR   Corner Balance Routine  Half Tandem  2 x 30 sec  -Ankle> hip strategies  Half  Tandem with horizontal head turns 2 x 10  -Ankle> hip strategies  Half Tandem with vertical head turns 2 x 10  -Ankle> hip strategies  Half Tandem with eyes closed 30 sec -Ankle> hip strategies   Half Tandem with EC and horizontal head turns 2 x 10  -Ankle> hip strategies  Half Tandem with EC and vertical head turns 2 x 10  -Hip>Ankle strategies     Updated HEP and educated patient on changes to exercises and addition of new exercises to include half tandem with vertical head turns, sit to stand, lower trunk rotations, and Shoulder ER/IR rotation.        PT Education - 06/28/21 1555     Education Details form and technique with exercise    Person(s) Educated Patient    Methods Explanation;Demonstration;Verbal cues;Handout    Comprehension Verbalized understanding;Returned demonstration;Verbal cues required              PT Short Term Goals - 06/24/21 1120        PT SHORT TERM GOAL #1   Title Patient will demonstrate independence with home exercise program for ability to self treat.    Baseline 11/9: NT    Time 2    Period Weeks    Status New    Target Date 07/08/21               PT Long Term Goals - 06/24/21 1122       PT LONG TERM GOAL #1   Title Patient will have improved function and activity level as evidenced by an increase in FOTO score by 10 points or more.    Baseline 11/9: 41/49    Time 8    Period Weeks    Status New    Target Date 08/18/21      PT LONG TERM GOAL #2   Title Patient will improve 30 sec chair stands to >=11 reps to decrease his risk of falling.    Baseline 11/9: 7 reps    Time 8    Period Weeks    Status New    Target Date 08/19/21      PT LONG TERM GOAL #3   Title Patient will improve Mini-BESTest score >=20 decrease risk of falling and demonstrate a statistically significant improvement in his balance.    Baseline 11/9: 15/32    Time 8    Period Weeks    Status New    Target Date 08/18/21      PT LONG TERM GOAL #4   Title Patient will improve 5 x STS score to <12.6 sec to demonstrate an improvement in LE strength and a decrease in his risk of falling.    Baseline 11/9: 12.6 sec    Time 8    Period Weeks    Status New    Target Date 08/18/21                   Plan - 06/29/21 1552     Clinical Impression Statement Pt presents for f/u treatment for imbalance secondary to PD. He exhibits benefit from axial rotation exercises and hip flexor stretch to correct kyphosis postural deficits. He exhibits increased reliance on vision to maintian his balance with difficulty in maintianing static balance when closing his eyes. He shows increased LE endurance with ability to perform 30 repititions of sit to stand exercises.  He will benefit from skilled PT to increase LE strength and endurance and to improve his balance to decrease  his risk of falling and to decrease caregiver burden.     Personal Factors and Comorbidities Age;Comorbidity 2;Time since onset of injury/illness/exacerbation;Past/Current Experience    Comorbidities PD, H/o cancer,    Examination-Activity Limitations Squat;Stairs;Stand    Examination-Participation Restrictions Cleaning;Medication Management;Interpersonal Relationship;Community Activity    Stability/Clinical Decision Making Evolving/Moderate complexity    Clinical Decision Making Low    Rehab Potential Fair    PT Frequency 2x / week    PT Duration 8 weeks    PT Treatment/Interventions ADLs/Self Care Home Management;Neuromuscular re-education;Balance training;Therapeutic exercise;Orthotic Fit/Training;Prosthetic Training;Patient/family education;Wheelchair mobility training;Manual techniques;Passive range of motion;Energy conservation;Vestibular;Joint Manipulations;Gait training;Therapeutic activities;Functional mobility training;Stair training    PT Next Visit Plan Progress LE strengthening and axial rotation exercises. Prone positioning for hip flexor and lumbar extension. Begin stepping reaction exercises    PT Home Exercise Plan [email or web address]    Consulted and Agree with Plan of Care Patient;Family member/caregiver            HEP includes the following:  Access Code: [email or web address] URL: [email or web address] Date: 06/28/2021 Prepared by: Bradly Chris  Exercises Sit to Stand - 1 x daily - 3 x weekly - 3 sets - 10 reps Half Tandem Stance Balance with Head Nods - 1 x daily - 7 x weekly - 3 sets - 10 reps Modified Thomas Stretch - 1 x daily - 7 x weekly - 1 sets - 3 reps - 60 hold   Patient will benefit from skilled therapeutic intervention in order to improve the following deficits and impairments:  Abnormal gait, Decreased strength, Hypomobility, Decreased endurance, Decreased range of motion, Impaired flexibility, Postural dysfunction  Visit Diagnosis: Difficulty in walking, not elsewhere classified  Abnormal  posture     Problem List Patient Active Problem List   Diagnosis Date Noted   Acute metabolic encephalopathy 62/94/7654   AKI (acute kidney injury) ([place]) 07/09/2020   Leukocytosis 07/09/2020   S/P deep brain stimulator placement 07/09/2020   Squamous cell carcinoma, 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 04/24/2020   Cognitive impairment 02/22/2013   Parkinson's disease ([place]) 09/28/2011   Bradly Chris PT, DPT  06/29/2021, 4:02 PM  [place] [facility] [address] [place], [address] Phone: [phone]   Fax:  [phone]  Name: Wesley Anderson. MRN: [id number] Date of Birth: 21-Aug-1945

## 2021-06-30 ENCOUNTER — Ambulatory Visit: Payer: Medicare HMO | Admitting: Physical Therapy

## 2021-07-01 ENCOUNTER — Ambulatory Visit: Payer: Medicare HMO | Admitting: Physical Therapy

## 2021-07-01 DIAGNOSIS — R293 Abnormal posture: Secondary | ICD-10-CM

## 2021-07-01 DIAGNOSIS — R262 Difficulty in walking, not elsewhere classified: Secondary | ICD-10-CM

## 2021-07-01 NOTE — Therapy (Signed)
[facility] [address] [place], [address] Phone: [phone]   Fax:  [phone]  Physical Therapy Treatment  Patient Details  Name: Wesley Anderson. MRN: [id number] Date of Birth: [birth year] Referring Provider (PT): Red Christians, PA-C   Encounter Date: 07/01/2021   PT End of Session - 07/01/21 1332     Visit Number 3    Number of Visits 16    Date for PT Re-Evaluation 08/19/21    PT Start Time 1330    PT Stop Time 6962    PT Time Calculation (min) 45 min    Equipment Utilized During Treatment Gait belt    Activity Tolerance Patient tolerated treatment well    Behavior During Therapy Flat affect;WFL for tasks assessed/performed             Past Medical History:  Diagnosis Date   AAA (abdominal aortic aneurysm)    Alcohol dependence in remission ([place])    Cancer ([place])    skin    Colon polyps    ED (erectile dysfunction)    GERD (gastroesophageal reflux disease)    Hematest positive stools    Melanoma of skin ([place])    Parkinson's disease ([place])    Renal cyst, right    Restless leg syndrome     Past Surgical History:  Procedure Laterality Date   CHOLECYSTECTOMY     COLON SURGERY     colectomy with anastamosis   COLONOSCOPY WITH PROPOFOL N/A 11/07/2016   Procedure: COLONOSCOPY WITH PROPOFOL;  Surgeon: Lollie Sails, MD;  Location: [facility] ENDOSCOPY;  Service: Endoscopy;  Laterality: N/A;   COLONOSCOPY WITH PROPOFOL N/A 09/06/2018   Procedure: COLONOSCOPY WITH PROPOFOL;  Surgeon: Lollie Sails, MD;  Location: [facility] ENDOSCOPY;  Service: Endoscopy;  Laterality: N/A;   COLONOSCOPY WITH PROPOFOL N/A 05/16/2019   Procedure: COLONOSCOPY WITH PROPOFOL;  Surgeon: Lollie Sails, MD;  Location: [facility] ENDOSCOPY;  Service: Endoscopy;  Laterality: N/A;   CYST EXCISION     vocal cord   DBS placement     deep brain implant     ESOPHAGOGASTRODUODENOSCOPY (EGD) WITH PROPOFOL N/A 09/06/2018    Procedure: ESOPHAGOGASTRODUODENOSCOPY (EGD) WITH PROPOFOL;  Surgeon: Lollie Sails, MD;  Location: [facility] ENDOSCOPY;  Service: Endoscopy;  Laterality: N/A;   MELANOMA EXCISION     TONSILLECTOMY      There were no vitals filed for this visit.   Subjective Assessment - 07/01/21 1330     Subjective Pt reports feeling more unsteady today. He mentions having a pelton knock off bike and that he exercises uses that.    How long can you sit comfortably? N/a    How long can you stand comfortably? Kyphotic posture    How long can you walk comfortably? Not long. Difficulty with freezing of gait    Patient Stated Goals Wants to work on balance so that he does not fall. He wants to be steady enough to play golf.    Currently in Pain? No/denies            THEREX:  Recumbent Bicycle 10 min at 4-6 RPE  -Educated pt on use of RPE scale to gauge intensity using sing test  -Pt needed to stop after 5 min due to fatigue     Prone Press Ups on Elbows 1 x 10   Reach and Roll to Left 1 x 10  -Use of LUE because pt has a right RTC  -Education how  to use technique and sequence task of transferring from sidelying to sit by taking feet of bed first.   -Pt able to perform independently   NMR   Stepping Reaction with Left and Right Foot in Clock Pattern 4 x 10 -min VC to increase the amplitude of the movement   Updated HEP and educated patient on changes to exercises and addition of new exercises prone press up, reach and roll, and stepping reactions.               PT Education - 07/01/21 1331     Education Details form and technique with exercise    Person(s) Educated Patient    Methods Explanation;Demonstration;Verbal cues;Handout    Comprehension Verbalized understanding;Returned demonstration;Verbal cues required              PT Short Term Goals - 07/01/21 1338       PT SHORT TERM GOAL #1   Title Patient will demonstrate independence with home exercise program for  ability to self treat.    Baseline 11/9: NT    Time 2    Period Weeks    Status New    Target Date 07/08/21               PT Long Term Goals - 07/01/21 1339       PT LONG TERM GOAL #1   Title Patient will have improved function and activity level as evidenced by an increase in FOTO score by 10 points or more.    Baseline 11/9: 41/49    Time 8    Period Weeks    Status On-going      PT LONG TERM GOAL #2   Title Patient will improve 30 sec chair stands to >=11 reps to decrease his risk of falling.    Baseline 11/9: 7 reps    Time 8    Period Weeks    Status On-going      PT LONG TERM GOAL #3   Title Patient will improve Mini-BESTest score >=20 decrease risk of falling and demonstrate a statistically significant improvement in his balance.    Baseline 11/9: 15/32    Time 8    Period Weeks    Status On-going      PT LONG TERM GOAL #4   Title Patient will improve 5 x STS score to <12.6 sec to demonstrate an improvement in LE strength and a decrease in his risk of falling.    Baseline 11/9: 20 sec    Time 8    Period Weeks    Status New                   Plan - 07/01/21 1604     Clinical Impression Statement Pt presents for f/u treatment for imbalance and difficulty walking secondary to PD. Pt instructed on how to gauge intensity using RPE and pt able to independently perform during session to gauge intensity with recumbent bicycle. Pt shows ability to independently perform a supine to sit transfer with min VC. He continues to be limited by decreased amplitude moevements and difficulty initiating movements especially during stepping reaction tasks.  He will continue to benefit from skilled PT to increase LE strength and endurance and to improve his balance to decrease his risk of falling and to decrease caregiver burden.    Personal Factors and Comorbidities Age;Comorbidity 2;Time since onset of injury/illness/exacerbation;Past/Current Experience    Comorbidities  PD, H/o cancer,    Examination-Activity Limitations Squat;Stairs;Stand  Examination-Participation Restrictions Cleaning;Medication Management;Interpersonal Relationship;Community Activity    Stability/Clinical Decision Making Evolving/Moderate complexity    Rehab Potential Fair    PT Frequency 2x / week    PT Duration 8 weeks    PT Treatment/Interventions ADLs/Self Care Home Management;Neuromuscular re-education;Balance training;Therapeutic exercise;Orthotic Fit/Training;Prosthetic Training;Patient/family education;Wheelchair mobility training;Manual techniques;Passive range of motion;Energy conservation;Vestibular;Joint Manipulations;Gait training;Therapeutic activities;Functional mobility training;Stair training    PT Next Visit Plan Dynamic balance exercises and continue with stepping reaction tasks    PT Home Exercise Plan [place] and Agree with Plan of Care Patient;Family member/caregiver             Patient will benefit from skilled therapeutic intervention in order to improve the following deficits and impairments:  Abnormal gait, Decreased strength, Hypomobility, Decreased endurance, Decreased range of motion, Impaired flexibility, Postural dysfunction  Visit Diagnosis: Difficulty in walking, not elsewhere classified  Abnormal posture     Problem List Patient Active Problem List   Diagnosis Date Noted   Acute metabolic encephalopathy 99/87/2158   AKI (acute kidney injury) ([place]) 07/09/2020   Leukocytosis 07/09/2020   S/P deep brain stimulator placement 07/09/2020   Squamous cell carcinoma, 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, [place], [address] Phone: [phone]   Fax:  [phone]  Name: Wesley Anderson. MRN:  [id number] Date of Birth: 06-Jul-1946

## 2021-07-05 ENCOUNTER — Ambulatory Visit: Payer: Medicare HMO | Admitting: Physical Therapy

## 2021-07-05 ENCOUNTER — Telehealth: Payer: Self-pay | Admitting: Physical Therapy

## 2021-07-05 NOTE — Telephone Encounter (Signed)
Called pt to inquiry about absence. Pt did not pickup so VM instructing pt to call back to reschedule.

## 2021-07-07 ENCOUNTER — Ambulatory Visit: Payer: Medicare HMO | Admitting: Physical Therapy

## 2021-07-09 ENCOUNTER — Emergency Department: Payer: Medicare HMO

## 2021-07-09 ENCOUNTER — Encounter: Payer: Self-pay | Admitting: Emergency Medicine

## 2021-07-09 ENCOUNTER — Inpatient Hospital Stay
Admission: EM | Admit: 2021-07-09 | Discharge: 2021-07-14 | DRG: 193 | Disposition: A | Discharge status: Home Health | Payer: Medicare HMO | Attending: Internal Medicine | Admitting: Internal Medicine

## 2021-07-09 ENCOUNTER — Other Ambulatory Visit: Payer: Self-pay

## 2021-07-09 DIAGNOSIS — Z888 Allergy status to other drugs, medicaments and biological substances status: Secondary | ICD-10-CM

## 2021-07-09 DIAGNOSIS — Z85820 Personal history of malignant melanoma of skin: Secondary | ICD-10-CM | POA: Diagnosis not present

## 2021-07-09 DIAGNOSIS — G9341 Metabolic encephalopathy: Secondary | ICD-10-CM | POA: Diagnosis present

## 2021-07-09 DIAGNOSIS — I714 Abdominal aortic aneurysm, without rupture, unspecified: Secondary | ICD-10-CM | POA: Diagnosis present

## 2021-07-09 DIAGNOSIS — G20 Parkinson's disease: Secondary | ICD-10-CM | POA: Diagnosis present

## 2021-07-09 DIAGNOSIS — D649 Anemia, unspecified: Secondary | ICD-10-CM | POA: Diagnosis present

## 2021-07-09 DIAGNOSIS — R410 Disorientation, unspecified: Secondary | ICD-10-CM | POA: Diagnosis present

## 2021-07-09 DIAGNOSIS — D696 Thrombocytopenia, unspecified: Secondary | ICD-10-CM | POA: Diagnosis present

## 2021-07-09 DIAGNOSIS — Z9049 Acquired absence of other specified parts of digestive tract: Secondary | ICD-10-CM | POA: Diagnosis not present

## 2021-07-09 DIAGNOSIS — G20A1 Parkinson's disease without dyskinesia, without mention of fluctuations: Secondary | ICD-10-CM | POA: Diagnosis present

## 2021-07-09 DIAGNOSIS — R531 Weakness: Secondary | ICD-10-CM | POA: Diagnosis not present

## 2021-07-09 DIAGNOSIS — I959 Hypotension, unspecified: Secondary | ICD-10-CM | POA: Diagnosis present

## 2021-07-09 DIAGNOSIS — R509 Fever, unspecified: Secondary | ICD-10-CM | POA: Diagnosis present

## 2021-07-09 DIAGNOSIS — Z20822 Contact with and (suspected) exposure to covid-19: Secondary | ICD-10-CM | POA: Diagnosis present

## 2021-07-09 DIAGNOSIS — J101 Influenza due to other identified influenza virus with other respiratory manifestations: Principal | ICD-10-CM | POA: Diagnosis present

## 2021-07-09 DIAGNOSIS — M791 Myalgia, unspecified site: Secondary | ICD-10-CM | POA: Diagnosis present

## 2021-07-09 DIAGNOSIS — K219 Gastro-esophageal reflux disease without esophagitis: Secondary | ICD-10-CM | POA: Diagnosis present

## 2021-07-09 DIAGNOSIS — G2581 Restless legs syndrome: Secondary | ICD-10-CM | POA: Diagnosis present

## 2021-07-09 DIAGNOSIS — R195 Other fecal abnormalities: Secondary | ICD-10-CM | POA: Diagnosis present

## 2021-07-09 DIAGNOSIS — R21 Rash and other nonspecific skin eruption: Secondary | ICD-10-CM | POA: Diagnosis present

## 2021-07-09 DIAGNOSIS — E860 Dehydration: Secondary | ICD-10-CM | POA: Diagnosis present

## 2021-07-09 DIAGNOSIS — F1021 Alcohol dependence, in remission: Secondary | ICD-10-CM | POA: Diagnosis present

## 2021-07-09 DIAGNOSIS — Z79899 Other long term (current) drug therapy: Secondary | ICD-10-CM

## 2021-07-09 LAB — RESP PANEL BY RT-PCR (FLU A&B, COVID) ARPGX2
Influenza A by PCR: POSITIVE — AB
Influenza B by PCR: NEGATIVE
SARS Coronavirus 2 by RT PCR: NEGATIVE

## 2021-07-09 LAB — CBC WITH DIFFERENTIAL/PLATELET
Abs Immature Granulocytes: 0.01 10*3/uL (ref 0.00–0.07)
Basophils Absolute: 0 10*3/uL (ref 0.0–0.1)
Basophils Relative: 0 %
Eosinophils Absolute: 0 10*3/uL (ref 0.0–0.5)
Eosinophils Relative: 0 %
HCT: 35.9 % — ABNORMAL LOW (ref 39.0–52.0)
Hemoglobin: 12.4 g/dL — ABNORMAL LOW (ref 13.0–17.0)
Immature Granulocytes: 0 %
Lymphocytes Relative: 16 %
Lymphs Abs: 1 10*3/uL (ref 0.7–4.0)
MCH: 31.2 pg (ref 26.0–34.0)
MCHC: 34.5 g/dL (ref 30.0–36.0)
MCV: 90.2 fL (ref 80.0–100.0)
Monocytes Absolute: 0.5 10*3/uL (ref 0.1–1.0)
Monocytes Relative: 7 %
Neutro Abs: 5 10*3/uL (ref 1.7–7.7)
Neutrophils Relative %: 77 %
Platelets: 138 10*3/uL — ABNORMAL LOW (ref 150–400)
RBC: 3.98 MIL/uL — ABNORMAL LOW (ref 4.22–5.81)
RDW: 13.9 % (ref 11.5–15.5)
WBC: 6.5 10*3/uL (ref 4.0–10.5)
nRBC: 0 % (ref 0.0–0.2)

## 2021-07-09 LAB — T4, FREE: Free T4: 0.73 ng/dL (ref 0.61–1.12)

## 2021-07-09 LAB — COMPREHENSIVE METABOLIC PANEL
ALT: 12 U/L (ref 0–44)
AST: 37 U/L (ref 15–41)
Albumin: 3.5 g/dL (ref 3.5–5.0)
Alkaline Phosphatase: 63 U/L (ref 38–126)
Anion gap: 6 (ref 5–15)
BUN: 24 mg/dL — ABNORMAL HIGH (ref 8–23)
CO2: 22 mmol/L (ref 22–32)
Calcium: 8.5 mg/dL — ABNORMAL LOW (ref 8.9–10.3)
Chloride: 107 mmol/L (ref 98–111)
Creatinine, Ser: 1.11 mg/dL (ref 0.61–1.24)
GFR, Estimated: >60 mL/min (ref >60)
Glucose, Bld: 106 mg/dL — ABNORMAL HIGH (ref 70–99)
Potassium: 3.7 mmol/L (ref 3.5–5.1)
Sodium: 135 mmol/L (ref 135–145)
Total Bilirubin: 1 mg/dL (ref 0.3–1.2)
Total Protein: 7 g/dL (ref 6.5–8.1)

## 2021-07-09 LAB — URINALYSIS, ROUTINE W REFLEX MICROSCOPIC
Bilirubin Urine: NEGATIVE
Glucose, UA: NEGATIVE mg/dL
Hgb urine dipstick: NEGATIVE
Leukocytes,Ua: NEGATIVE
Nitrite: NEGATIVE
Protein, ur: NEGATIVE mg/dL
Specific Gravity, Urine: 1.01 (ref 1.005–1.030)
pH: 5.5 (ref 5.0–8.0)

## 2021-07-09 LAB — LACTIC ACID, PLASMA
Lactic Acid, Venous: 0.8 mmol/L (ref 0.5–1.9)
Lactic Acid, Venous: 1.1 mmol/L (ref 0.5–1.9)

## 2021-07-09 LAB — TROPONIN I (HIGH SENSITIVITY): Troponin I (High Sensitivity): 13 ng/L (ref <18)

## 2021-07-09 LAB — IRON AND TIBC
Iron: 26 ug/dL — ABNORMAL LOW (ref 45–182)
Saturation Ratios: 10 % — ABNORMAL LOW (ref 17.9–39.5)
TIBC: 251 ug/dL (ref 250–450)
UIBC: 225 ug/dL

## 2021-07-09 LAB — RETICULOCYTES
Immature Retic Fract: 3.7 % (ref 2.3–15.9)
RBC.: 4.28 MIL/uL (ref 4.22–5.81)
Retic Count, Absolute: 20.3 10*3/uL (ref 19.0–186.0)
Retic Ct Pct: 0.5 % (ref 0.4–3.1)

## 2021-07-09 LAB — CBC
HCT: 38.9 % — ABNORMAL LOW (ref 39.0–52.0)
Hemoglobin: 13 g/dL (ref 13.0–17.0)
MCH: 29.9 pg (ref 26.0–34.0)
MCHC: 33.4 g/dL (ref 30.0–36.0)
MCV: 89.4 fL (ref 80.0–100.0)
Platelets: 127 10*3/uL — ABNORMAL LOW (ref 150–400)
RBC: 4.35 MIL/uL (ref 4.22–5.81)
RDW: 13.9 % (ref 11.5–15.5)
WBC: 5.1 10*3/uL (ref 4.0–10.5)
nRBC: 0 % (ref 0.0–0.2)

## 2021-07-09 LAB — FERRITIN: Ferritin: 452 ng/mL — ABNORMAL HIGH (ref 24–336)

## 2021-07-09 LAB — CREATININE, SERUM
Creatinine, Ser: 1.02 mg/dL (ref 0.61–1.24)
GFR, Estimated: >60 mL/min (ref >60)

## 2021-07-09 LAB — FOLATE: Folate: 26 ng/mL (ref >5.9)

## 2021-07-09 LAB — TSH: TSH: 2.043 u[IU]/mL (ref 0.350–4.500)

## 2021-07-09 MED ORDER — DONEPEZIL HCL 5 MG PO TABS: 10.0000 mg | ORAL_TABLET | Freq: Every day | ORAL | Status: DC

## 2021-07-09 MED ORDER — SODIUM CHLORIDE 0.9 % IV BOLUS: 500.0000 mL | Freq: Once | INTRAVENOUS | Status: DC

## 2021-07-09 MED ORDER — ACETAMINOPHEN 325 MG PO TABS
650.0000 mg | ORAL_TABLET | Freq: Four times a day (QID) | ORAL | Status: DC | PRN
  Administered 2021-07-09 – 2021-07-11 (×3): 650 mg via ORAL
  Filled 2021-07-09 (×4): qty 2

## 2021-07-09 MED ORDER — PANTOPRAZOLE SODIUM 40 MG IV SOLR
40.0000 mg | INTRAVENOUS | Status: DC
  Administered 2021-07-09 – 2021-07-10 (×2): 40 mg via INTRAVENOUS
  Filled 2021-07-09 (×2): qty 40

## 2021-07-09 MED ORDER — SODIUM CHLORIDE 0.9 % IV SOLN
1000.0000 mL | Freq: Once | INTRAVENOUS | Status: AC
  Administered 2021-07-09: 1000 mL via INTRAVENOUS

## 2021-07-09 MED ORDER — THIAMINE HCL 100 MG/ML IJ SOLN
100.0000 mg | Freq: Every day | INTRAMUSCULAR | Status: DC
  Administered 2021-07-09 – 2021-07-12 (×4): 100 mg via INTRAVENOUS
  Filled 2021-07-09 (×4): qty 2

## 2021-07-09 MED ORDER — ACETAMINOPHEN 650 MG RE SUPP: 650.0000 mg | Freq: Four times a day (QID) | RECTAL | Status: DC | PRN

## 2021-07-09 MED ORDER — OSELTAMIVIR PHOSPHATE 30 MG PO CAPS
30.0000 mg | ORAL_CAPSULE | Freq: Two times a day (BID) | ORAL | Status: DC
  Filled 2021-07-09: qty 1

## 2021-07-09 MED ORDER — OSELTAMIVIR PHOSPHATE 75 MG PO CAPS: 75.0000 mg | ORAL_CAPSULE | Freq: Two times a day (BID) | ORAL | Status: DC

## 2021-07-09 MED ORDER — CARBIDOPA-LEVODOPA 25-100 MG PO TABS
2.0000 | ORAL_TABLET | Freq: Three times a day (TID) | ORAL | Status: DC
  Administered 2021-07-10 – 2021-07-14 (×12): 2 via ORAL
  Filled 2021-07-09 (×12): qty 2

## 2021-07-09 MED ORDER — ONE-DAILY MULTI VITAMINS PO TABS: 1.0000 | ORAL_TABLET | Freq: Every day | ORAL | Status: DC

## 2021-07-09 MED ORDER — PANTOPRAZOLE SODIUM 40 MG PO TBEC: 40.0000 mg | DELAYED_RELEASE_TABLET | Freq: Every day | ORAL | Status: DC

## 2021-07-09 MED ORDER — ADULT MULTIVITAMIN W/MINERALS CH
1.0000 | ORAL_TABLET | Freq: Every day | ORAL | Status: DC
  Administered 2021-07-10 – 2021-07-14 (×5): 1 via ORAL
  Filled 2021-07-09 (×5): qty 1

## 2021-07-09 MED ORDER — SODIUM CHLORIDE 0.9 % IV SOLN: INTRAVENOUS | Status: DC

## 2021-07-09 MED ORDER — SODIUM CHLORIDE 0.9% FLUSH
3.0000 mL | Freq: Two times a day (BID) | INTRAVENOUS | Status: DC
  Administered 2021-07-09 – 2021-07-14 (×10): 3 mL via INTRAVENOUS

## 2021-07-09 MED ORDER — DONEPEZIL HCL 5 MG PO TABS
10.0000 mg | ORAL_TABLET | Freq: Every day | ORAL | Status: DC
  Administered 2021-07-09 – 2021-07-13 (×5): 10 mg via ORAL
  Filled 2021-07-09 (×5): qty 2

## 2021-07-09 MED ORDER — HEPARIN SODIUM (PORCINE) 5000 UNIT/ML IJ SOLN
5000.0000 [IU] | Freq: Three times a day (TID) | INTRAMUSCULAR | Status: DC
  Administered 2021-07-09 – 2021-07-14 (×14): 5000 [IU] via SUBCUTANEOUS
  Filled 2021-07-09 (×13): qty 1

## 2021-07-09 MED ORDER — OSELTAMIVIR PHOSPHATE 75 MG PO CAPS
75.0000 mg | ORAL_CAPSULE | Freq: Once | ORAL | Status: AC
  Administered 2021-07-09: 75 mg via ORAL
  Filled 2021-07-09: qty 1

## 2021-07-09 MED ORDER — ROPINIROLE HCL ER 8 MG PO TB24: 8.0000 mg | ORAL_TABLET | Freq: Every day | ORAL | Status: DC

## 2021-07-09 MED ORDER — QUETIAPINE FUMARATE 25 MG PO TABS
25.0000 mg | ORAL_TABLET | Freq: Every day | ORAL | Status: DC
  Administered 2021-07-09 – 2021-07-13 (×5): 25 mg via ORAL
  Filled 2021-07-09 (×5): qty 1

## 2021-07-09 NOTE — ED Notes (Signed)
Adult brief applied in addition to external catheter; new gown/ new incontinence pads; new blanket applied

## 2021-07-09 NOTE — H&P (Signed)
History and Physical    Wesley Anderson. BDZ:[id number] DOB: [birth year] DOA: 07/09/2021  PCP: Adin Hector, MD    Patient coming from:  Home   Chief Complaint:  Generalized weakness   HPI:  Wesley Drewes. is a 75 y.o. male seen in ed with complaints of lysed weakness fevers chills lethargy for the past few days-Monday the 21st.  Reports chest congestion and has been lethargic with fevers. Pt has fallen due to his weakness but has not hurt himself or hit his head.   Pt has past medical history of skin cancer, abdominal aortic aneurysm, GERD, melanoma, Parkinson's disease.  ED Course: In the emergency room patient has hypotensive, dehydrated, currently receiving fluids. Vitals:   07/09/21 1333 07/09/21 1334 07/09/21 1815 07/09/21 1830  BP: 98/63  136/78 (!) 144/75  Pulse: 66  (!) 51 (!) 53  Resp: 16  (!) 22 18  Temp: 98.8 F (37.1 C)  98.6 F (37 C)   TempSrc: Oral  Oral   SpO2: 92%  98% 98%  Weight:  79.4 kg    Height:  5\' 9"  (1.753 m)    Vitals as above, patient found to have influenza A, COVID-negative, CMP shows glucose of 106 otherwise normal, Normal troponin, normal lactic, CBC shows normal white count of 6.5, hemoglobin of 12.4, platelets of 138. Chest x-ray shows no active cardiopulmonary disease, O2 sats are 98% on room air.  Review of Systems:  Review of Systems  Constitutional:  Positive for chills, fever and malaise/fatigue.  HENT: Negative.    Eyes: Negative.   Respiratory:  Positive for cough.   Cardiovascular: Negative.   Musculoskeletal:  Positive for myalgias.  Skin: Negative.     Past Medical History:  Diagnosis Date   AAA (abdominal aortic aneurysm)    Alcohol dependence in remission ([place])    Cancer ([place])    skin    Colon polyps    ED (erectile dysfunction)    GERD (gastroesophageal reflux disease)    Hematest positive stools    Melanoma of skin ([facility])    Parkinson's disease ([place])    Renal cyst, right    Restless leg  syndrome     Past Surgical History:  Procedure Laterality Date   CHOLECYSTECTOMY     COLON SURGERY     colectomy with anastamosis   COLONOSCOPY WITH PROPOFOL N/A 11/07/2016   Procedure: COLONOSCOPY WITH PROPOFOL;  Surgeon: Lollie Sails, MD;  Location: [facility] ENDOSCOPY;  Service: Endoscopy;  Laterality: N/A;   COLONOSCOPY WITH PROPOFOL N/A 09/06/2018   Procedure: COLONOSCOPY WITH PROPOFOL;  Surgeon: Lollie Sails, MD;  Location: [facility] ENDOSCOPY;  Service: Endoscopy;  Laterality: N/A;   COLONOSCOPY WITH PROPOFOL N/A 05/16/2019   Procedure: COLONOSCOPY WITH PROPOFOL;  Surgeon: Lollie Sails, MD;  Location: [facility] ENDOSCOPY;  Service: Endoscopy;  Laterality: N/A;   CYST EXCISION     vocal cord   DBS placement     deep brain implant     ESOPHAGOGASTRODUODENOSCOPY (EGD) WITH PROPOFOL N/A 09/06/2018   Procedure: ESOPHAGOGASTRODUODENOSCOPY (EGD) WITH PROPOFOL;  Surgeon: Lollie Sails, MD;  Location: [facility] ENDOSCOPY;  Service: Endoscopy;  Laterality: N/A;   MELANOMA EXCISION     TONSILLECTOMY       reports that he has quit smoking. He has never used smokeless tobacco. He reports current alcohol use of about 6.0 standard drinks per week. He reports that he does not currently use drugs.  Allergies  Allergen Reactions   Artane [  Trihexyphenidyl] Anxiety    History reviewed. No pertinent family history.  Prior to Admission medications   Medication Sig Start Date End Date Taking? Authorizing Provider  bacitracin 500 UNIT/GM ointment Apply 1 application topically 2 (two) times daily. 07/05/20   [provider]  carbidopa-levodopa (SINEMET IR) 25-100 MG tablet Take 1.5 tablets by mouth 3 (three) times daily. 07/14/20 08/13/20  Darliss Cheney, MD  donepezil (ARICEPT) 10 MG tablet Take 10 mg by mouth daily. 08/06/20   [provider]  Multiple Vitamin (MULTIVITAMIN) tablet Take 1 tablet by mouth daily.    [provider]  pantoprazole (PROTONIX) 40 MG tablet  Take 40 mg by mouth daily.    [provider]  QUEtiapine (SEROQUEL) 25 MG tablet Take 1 tablet (25 mg total) by mouth at bedtime. 07/14/20 08/13/20  Darliss Cheney, MD  rOPINIRole (REQUIP XL) 8 MG 24 hr tablet Take 8 mg by mouth at bedtime.    [provider]  sildenafil (VIAGRA) 100 MG tablet Take 100 mg by mouth daily as needed for erectile dysfunction.    [provider]  urea (CARMOL) 40 % CREA Apply topically daily.    [provider]    Physical Exam: Vitals:   07/09/21 1333 07/09/21 1334 07/09/21 1815 07/09/21 1830  BP: 98/63  136/78 (!) 144/75  Pulse: 66  (!) 51 (!) 53  Resp: 16  (!) 22 18  Temp: 98.8 F (37.1 C)  98.6 F (37 C)   TempSrc: Oral  Oral   SpO2: 92%  98% 98%  Weight:  79.4 kg    Height:  5\' 9"  (1.753 m)     Physical Exam Vitals reviewed.  Constitutional:      Appearance: He is ill-appearing.  HENT:     Head: Normocephalic and atraumatic.     Right Ear: External ear normal.     Left Ear: External ear normal.     Nose: Nose normal.     Mouth/Throat:     Mouth: Mucous membranes are dry.  Eyes:     Extraocular Movements: Extraocular movements intact.     Pupils: Pupils are equal, round, and reactive to light.  Neck:     Vascular: No carotid bruit.  Cardiovascular:     Rate and Rhythm: Normal rate and regular rhythm.     Pulses: Normal pulses.     Heart sounds: Normal heart sounds.  Pulmonary:     Effort: Pulmonary effort is normal.     Breath sounds: Normal breath sounds.  Abdominal:     General: Bowel sounds are normal. There is no distension.     Palpations: Abdomen is soft. There is no mass.     Tenderness: There is no abdominal tenderness. There is no guarding.     Hernia: No hernia is present.  Musculoskeletal:     Right lower leg: No edema.     Left lower leg: No edema.  Skin:    General: Skin is warm.  Neurological:     General: No focal deficit present.     Mental Status: He is alert and oriented to  person, place, and time.  Psychiatric:        Mood and Affect: Mood normal.        Behavior: Behavior normal.   Labs on Admission: I have personally reviewed following labs and imaging studies  No results for input(s): CKTOTAL, CKMB, TROPONINI in the last 72 hours. Lab Results  Component Value Date   WBC 6.5  07/09/2021   HGB 12.4 (L) 07/09/2021   HCT 35.9 (L) 07/09/2021   MCV 90.2 07/09/2021   PLT 138 (L) 07/09/2021    Recent Labs  Lab 07/09/21 1337  NA 135  K 3.7  CL 107  CO2 22  BUN 24*  CREATININE 1.11  CALCIUM 8.5*  PROT 7.0  BILITOT 1.0  ALKPHOS 63  ALT 12  AST 37  GLUCOSE 106*   No results found for: CHOL, HDL, LDLCALC, TRIG No results found for: DDIMER Invalid input(s): POCBNP   COVID-19 Labs No results for input(s): DDIMER, FERRITIN, LDH, CRP in the last 72 hours. Lab Results  Component Value Date   SARSCOV2NAA NEGATIVE 07/09/2021   [place] NEGATIVE 07/13/2020   [place] NEGATIVE 07/09/2020   [place] NEGATIVE 05/13/2019    Radiological Exams on Admission: DG Chest 2 View  Result Date: 07/09/2021 CLINICAL DATA:  Fever and cough EXAM: CHEST - 2 VIEW COMPARISON:  07/09/2020 FINDINGS: The heart size and mediastinal contours are within normal limits. Both lungs are clear. The visualized skeletal structures are unremarkable. Electrical generator pack in the right chest with lead extending into the neck. This is consistent with a deep brain stimulator as noted on CT. IMPRESSION: No active cardiopulmonary disease. Electronically Signed   By: Franchot Gallo M.D.   On: 07/09/2021 14:00    EKG: Independently reviewed.  None    Assessment/Plan: Patient is a pleasant 75 year old Caucasian male with past medical history of skin cancer presenting with generalized weakness fevers and chills found to have influenza A.  We will admit patient to medical telemetry floor. Principal Problem:   Generalized weakness Active Problems:   Influenza A   Fever and  chills   Myalgia   Heme positive stool   Anemia   Parkinson's disease ([facility])   History of malignant melanoma of skin Generalized weakness: Attribute to acute influenza-like illness, with myalgias fevers and chills. Continue with supportive care.  Antipyretics, antiemetics.  Fevers and chills/myalgia/influenza A: Will start patient on Tamiflu Place patient on droplet precautions. CPK pending, as needed Tylenol.  Anemia/history of heme positive stool: We will repeat CBC and follow-up, mild anemia differentials include hypoxia related, dilation related, cancer related, GI bleed related due to chronic blood loss. Patient continued on ropinirole. Repeat anemia panel and stool guaiac panel.   Parkinson's disease: Patient continued on carbidopa-levodopa Sinemet 25-100 combination 1.5 tablet 3 times daily, along with donepezil, Seroquel.  Malignant melanoma of the skin: Patient currently being followed by oncology.    Bradycardia: Telemetry monitoring for the next 48 hours.  Falls: Attribute patient's recent fall to generalized weakness secondary to influenza A. CPK is currently pending / we will get PT assessment prior to discharge.  DVT prophylaxis:  Heparin  Code Status:  Full code  Family Communication:  Kaycee, Mcgaugh (Spouse)  [phone] (Mobile)   Disposition Plan:  Home  Consults called:  Ancillary-PT consult for gait and safety with ambulation.  Admission status: Inpatient   Para Skeans MD [facility] [phone] How to contact the [facility] Attending or Consulting provider [place] or covering provider during after hours [place], for this patient.    Check the care team in [facility] and look for a) attending/consulting [facility] provider listed and b) the [facility] team listed Log into [email or web address] and use [place]'s universal password to access. If you do not have the password, please contact the hospital operator. Locate the [facility] provider you are looking for under  [facility] and page to  a number that you can be directly reached. If you still have difficulty reaching the provider, please page the [facility] (Director on Call) for the Hospitalists listed on amion for assistance. [email or web address] Password [facility] 07/09/2021, 7:31 PM

## 2021-07-09 NOTE — ED Provider Notes (Signed)
[facility] Emergency Department Provider Note   ____________________________________________    I have reviewed the triage vital signs and the nursing notes.   HISTORY  Chief Complaint Fever     HPI Wesley Anderson. is a 75 y.o. male with history of Parkinson's disease, additional past medical history as noted below who is undergoing chemotherapy for squamous cell carcinoma presents with complaints of weakness, fever.  His wife also notes periods of confusion corresponding to high fever.  This is been ongoing for several days now.  Wife reports that she is concerned that he is very unsteady  Past Medical History:  Diagnosis Date   AAA (abdominal aortic aneurysm)    Alcohol dependence in remission ([place])    Cancer ([place])    skin    Colon polyps    ED (erectile dysfunction)    GERD (gastroesophageal reflux disease)    Hematest positive stools    Melanoma of skin (HCC)    Parkinson's disease ([place])    Renal cyst, right    Restless leg syndrome     Patient Active Problem List   Diagnosis Date Noted   Acute metabolic encephalopathy 19/62/2297   AKI (acute kidney injury) ([place]) 07/09/2020   Leukocytosis 07/09/2020   S/P deep brain stimulator placement 07/09/2020   Squamous cell carcinoma, 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 04/24/2020   Cognitive impairment 02/22/2013   Parkinson's disease ([place]) 09/28/2011    Past Surgical History:  Procedure Laterality Date   CHOLECYSTECTOMY     COLON SURGERY     colectomy with anastamosis   COLONOSCOPY WITH PROPOFOL N/A 11/07/2016   Procedure: COLONOSCOPY WITH PROPOFOL;  Surgeon: Lollie Sails, MD;  Location: [facility] ENDOSCOPY;  Service: Endoscopy;  Laterality: N/A;   COLONOSCOPY WITH PROPOFOL N/A 09/06/2018   Procedure: COLONOSCOPY WITH PROPOFOL;  Surgeon: Lollie Sails, MD;  Location: [facility] ENDOSCOPY;  Service: Endoscopy;  Laterality: N/A;   COLONOSCOPY WITH PROPOFOL N/A 05/16/2019   Procedure:  COLONOSCOPY WITH PROPOFOL;  Surgeon: Lollie Sails, MD;  Location: [facility] ENDOSCOPY;  Service: Endoscopy;  Laterality: N/A;   CYST EXCISION     vocal cord   DBS placement     deep brain implant     ESOPHAGOGASTRODUODENOSCOPY (EGD) WITH PROPOFOL N/A 09/06/2018   Procedure: ESOPHAGOGASTRODUODENOSCOPY (EGD) WITH PROPOFOL;  Surgeon: Lollie Sails, MD;  Location: [facility] ENDOSCOPY;  Service: Endoscopy;  Laterality: N/A;   MELANOMA EXCISION     TONSILLECTOMY      Prior to Admission medications   Medication Sig Start Date End Date Taking? Authorizing Provider  bacitracin 500 UNIT/GM ointment Apply 1 application topically 2 (two) times daily. 07/05/20   [provider]  carbidopa-levodopa (SINEMET IR) 25-100 MG tablet Take 1.5 tablets by mouth 3 (three) times daily. 07/14/20 08/13/20  Darliss Cheney, MD  donepezil (ARICEPT) 10 MG tablet Take 10 mg by mouth daily. 08/06/20   [provider]  Multiple Vitamin (MULTIVITAMIN) tablet Take 1 tablet by mouth daily.    [provider]  pantoprazole (PROTONIX) 40 MG tablet Take 40 mg by mouth daily.    [provider]  QUEtiapine (SEROQUEL) 25 MG tablet Take 1 tablet (25 mg total) by mouth at bedtime. 07/14/20 08/13/20  Darliss Cheney, MD  rOPINIRole (REQUIP XL) 8 MG 24 hr tablet Take 8 mg by mouth at bedtime.    [provider]  sildenafil (VIAGRA) 100 MG tablet Take 100 mg by mouth daily as needed for erectile dysfunction.  [provider]  urea (CARMOL) 40 % CREA Apply topically daily.    [provider]     Allergies Artane [trihexyphenidyl]  No family history on file.  Social History Social History   Tobacco Use   Smoking status: Former   Smokeless tobacco: Never  Scientific laboratory technician Use: Never used  Substance Use Topics   Alcohol use: Yes    Alcohol/week: 6.0 standard drinks    Types: 3 Shots of liquor, 3 Standard drinks or equivalent per week   Drug use: Not Currently     Review of Systems  Constitutional: Positive fever Eyes: No visual changes.  ENT: No sore throat. Cardiovascular: Denies chest pain. Respiratory: Denies shortness of breath. Gastrointestinal: No abdominal pain.  Genitourinary: Negative for dysuria. Musculoskeletal: Negative for back pain. Skin: Negative for rash. Neurological: Negative for headaches, no focal   ____________________________________________   PHYSICAL EXAM:  VITAL SIGNS: ED Triage Vitals  Enc Vitals Group     BP 07/09/21 1333 98/63     Pulse Rate 07/09/21 1333 66     Resp 07/09/21 1333 16     Temp 07/09/21 1333 98.8 F (37.1 C)     Temp Source 07/09/21 1333 Oral     SpO2 07/09/21 1333 92 %     Weight 07/09/21 1334 79.4 kg (175 lb)     Height 07/09/21 1334 1.753 m (5\' 9" )     Head Circumference --      Peak Flow --      Pain Score 07/09/21 1333 0     Pain Loc --      Pain Edu? --      Excl. in [place]? --     Constitutional: Alert and oriented. No acute distress.   Nose: No congestion/rhinnorhea. Mouth/Throat: Mucous membranes are moist.   Neck:  Painless ROM Cardiovascular: Normal rate, regular rhythm. Grossly normal heart sounds.  Good peripheral circulation. Respiratory: Normal respiratory effort.  No retractions.  Gastrointestinal: Soft and nontender. No distention.    Musculoskeletal: Warm and well perfused Neurologic:  Normal speech and language. No gross focal neurologic deficits are appreciated.  Skin:  Skin is warm, dry and intact. No rash noted. Psychiatric: Mood and affect are normal. Speech and behavior are normal.  ____________________________________________   LABS (all labs ordered are listed, but only abnormal results are displayed)  Labs Reviewed  RESP PANEL BY RT-PCR (FLU A&B, COVID) ARPGX2 - Abnormal; Notable for the following components:      Result Value   Influenza A by PCR POSITIVE (*)    All other components within normal limits  CBC WITH DIFFERENTIAL/PLATELET -  Abnormal; Notable for the following components:   RBC 3.98 (*)    Hemoglobin 12.4 (*)    HCT 35.9 (*)    Platelets 138 (*)    All other components within normal limits  COMPREHENSIVE METABOLIC PANEL - Abnormal; Notable for the following components:   Glucose, Bld 106 (*)    BUN 24 (*)    Calcium 8.5 (*)    All other components within normal limits  LACTIC ACID, PLASMA  LACTIC ACID, PLASMA  URINALYSIS, ROUTINE W REFLEX MICROSCOPIC  CK  TROPONIN I (HIGH SENSITIVITY)   ____________________________________________  EKG  None ____________________________________________  RADIOLOGY  Chest x-ray viewed by me, no acute abnormality ____________________________________________   PROCEDURES  Procedure(s) performed: No  Procedures   Critical Care performed: No ____________________________________________   INITIAL IMPRESSION / ASSESSMENT AND PLAN / ED COURSE  Pertinent  labs & imaging results that were available during my care of the patient were reviewed by me and considered in my medical decision making (see chart for details).   Patient presents with fatigue, generalized weakness, periods of confusion as detailed by his wife.  Overall well-appearing, AAOx4 here.  Vital signs are generally reassuring.  Lab work notable for mild elevation of BUN/creatinine ratio, normal lactic acid  Influenza A PCR is positive, this is likely the cause of his  He is not safe for discharge home given how weak and unsteady he is, discussed this with patient and wife and they agree with admission.  Discussed with Dr. Posey Pronto of hospital service for admission    ____________________________________________   FINAL CLINICAL IMPRESSION(S) / ED DIAGNOSES  Final diagnoses:  Weakness  Influenza A  Confusion        Note:  This document was prepared using Dragon voice recognition software and may include unintentional dictation errors.    Lavonia Drafts, MD 07/09/21 [phone]

## 2021-07-09 NOTE — ED Triage Notes (Signed)
Pt in via EMS from home with c/o fever for 2 days, wife concerned pt has UTI. Pt was given 1000mg  of tylenol at 10:30

## 2021-07-09 NOTE — Progress Notes (Signed)
PHARMACY NOTE:  ANTIMICROBIAL RENAL DOSAGE ADJUSTMENT  Current antimicrobial regimen includes a mismatch between antimicrobial dosage and estimated renal function.  As per policy approved by the Pharmacy & Therapeutics and Medical Executive Committees, the antimicrobial dosage will be adjusted accordingly.  Current antimicrobial dosage:  Tamiflu 75 mg BID  Indication: Influenza  Renal Function:  Estimated Creatinine Clearance: 59.3 mL/min (by C-G formula based on SCr of 1.11 mg/dL). []      On intermittent HD, scheduled: []      On CRRT    Antimicrobial dosage has been changed to:  75 mg x 1 dose, then 30 mg BID  Additional comments:   Thank you for allowing pharmacy to be a part of this patient's care.  [place], [facility] 07/09/2021 6:46 PM

## 2021-07-09 NOTE — ED Triage Notes (Signed)
Pt to ED via [facility] from Home for fever and possible UTI. Pt wife states that pt fever will go down with meds but then comes back. Pt is cancer pt and is on chemo, last treatment on 15th. Pt has also had some congestion with green mucus. Pt wife states that pt is also very weak and he is having trouble walking. Pt is in NAD.

## 2021-07-09 NOTE — ED Provider Notes (Addendum)
HPI: Pt is a 75 y.o. male who presents with complaints of weakness.  The patient p/w  weakness since Monday the 21st. + chest congestion. + fall-  but deny hitting his head.   ROS: no vomiting.   Past Medical History:  Diagnosis Date   AAA (abdominal aortic aneurysm)    Alcohol dependence in remission ([place])    Cancer ([place])    skin    Colon polyps    ED (erectile dysfunction)    GERD (gastroesophageal reflux disease)    Hematest positive stools    Melanoma of skin (HCC)    Parkinson's disease ([place])    Renal cyst, right    Restless leg syndrome    Vitals:   07/09/21 1333  BP: 98/63  Pulse: 66  Resp: 16  Temp: 98.8 F (37.1 C)  SpO2: 92%    Focused Physical Exam: Gen: No acute distress Head: atraumatic, normocephalic Eyes: Extraocular movements grossly intact; conjunctiva clear CV: RRR Lung: No increased WOB, no stridor GI: ND, no obvious masses Neuro: Alert and awake  Medical Decision Making and Plan: Given the patient's initial medical screening exam, the following diagnostic evaluation has been ordered. The patient will be placed in the appropriate treatment space, once one is available, to complete the evaluation and treatment. I have discussed the plan of care with the patient and I have advised the patient that an ED physician or mid-level practitioner will reevaluate their condition after the test results have been received, as the results may give them additional insight into the type of treatment they may need.   Diagnostics: labs   Treatments: none immediately   Vanessa [place], MD 07/09/21 1653    Vanessa [place], MD 07/09/21 [phone]

## 2021-07-10 DIAGNOSIS — G9341 Metabolic encephalopathy: Secondary | ICD-10-CM | POA: Diagnosis not present

## 2021-07-10 DIAGNOSIS — J101 Influenza due to other identified influenza virus with other respiratory manifestations: Secondary | ICD-10-CM | POA: Diagnosis not present

## 2021-07-10 DIAGNOSIS — R531 Weakness: Secondary | ICD-10-CM | POA: Diagnosis not present

## 2021-07-10 LAB — COMPREHENSIVE METABOLIC PANEL
ALT: 14 U/L (ref 0–44)
AST: 34 U/L (ref 15–41)
Albumin: 3 g/dL — ABNORMAL LOW (ref 3.5–5.0)
Alkaline Phosphatase: 57 U/L (ref 38–126)
Anion gap: 6 (ref 5–15)
BUN: 17 mg/dL (ref 8–23)
CO2: 24 mmol/L (ref 22–32)
Calcium: 8.4 mg/dL — ABNORMAL LOW (ref 8.9–10.3)
Chloride: 110 mmol/L (ref 98–111)
Creatinine, Ser: 0.98 mg/dL (ref 0.61–1.24)
GFR, Estimated: >60 mL/min (ref >60)
Glucose, Bld: 88 mg/dL (ref 70–99)
Potassium: 3.7 mmol/L (ref 3.5–5.1)
Sodium: 140 mmol/L (ref 135–145)
Total Bilirubin: 1 mg/dL (ref 0.3–1.2)
Total Protein: 6.3 g/dL — ABNORMAL LOW (ref 6.5–8.1)

## 2021-07-10 LAB — CBC
HCT: 37.5 % — ABNORMAL LOW (ref 39.0–52.0)
Hemoglobin: 12.5 g/dL — ABNORMAL LOW (ref 13.0–17.0)
MCH: 30 pg (ref 26.0–34.0)
MCHC: 33.3 g/dL (ref 30.0–36.0)
MCV: 89.9 fL (ref 80.0–100.0)
Platelets: 124 10*3/uL — ABNORMAL LOW (ref 150–400)
RBC: 4.17 MIL/uL — ABNORMAL LOW (ref 4.22–5.81)
RDW: 13.8 % (ref 11.5–15.5)
WBC: 4.4 10*3/uL (ref 4.0–10.5)
nRBC: 0 % (ref 0.0–0.2)

## 2021-07-10 LAB — TYPE AND SCREEN
ABO/RH(D): O NEG
Antibody Screen: NEGATIVE

## 2021-07-10 LAB — CK: Total CK: 88 U/L (ref 49–397)

## 2021-07-10 LAB — VITAMIN B12: Vitamin B-12: 875 pg/mL (ref 180–914)

## 2021-07-10 MED ORDER — ROPINIROLE HCL ER 8 MG PO TB24: 8.0000 mg | ORAL_TABLET | Freq: Every day | ORAL | Status: DC

## 2021-07-10 MED ORDER — SERTRALINE HCL 50 MG PO TABS
50.0000 mg | ORAL_TABLET | Freq: Every day | ORAL | Status: DC
  Administered 2021-07-10 – 2021-07-13 (×4): 50 mg via ORAL
  Filled 2021-07-10 (×4): qty 1

## 2021-07-10 MED ORDER — TAMSULOSIN HCL 0.4 MG PO CAPS
0.4000 mg | ORAL_CAPSULE | Freq: Every day | ORAL | Status: DC
  Administered 2021-07-10 – 2021-07-13 (×4): 0.4 mg via ORAL
  Filled 2021-07-10 (×4): qty 1

## 2021-07-10 MED ORDER — OSELTAMIVIR PHOSPHATE 75 MG PO CAPS
75.0000 mg | ORAL_CAPSULE | Freq: Two times a day (BID) | ORAL | Status: DC
  Administered 2021-07-10 – 2021-07-14 (×9): 75 mg via ORAL
  Filled 2021-07-10 (×11): qty 1

## 2021-07-10 NOTE — Progress Notes (Addendum)
PROGRESS NOTE    Wesley Anderson.  QPY:[id number] DOB: [birth year] DOA: 07/09/2021 PCP: Adin Hector, MD   Assessment & Plan:   Principal Problem:   Generalized weakness Active Problems:   Parkinson's disease ([place])   History of malignant melanoma of skin   Heme positive stool   Anemia   Fever and chills   Myalgia   Influenza A   Influenza A: continue on tamiflu. Droplet precautions. Continue w/ supportive care    Normocytic anemia: H&H are stable. No need for a transfusion or fecal occult. Can f/u outpatient w/ GI for hx of heme positive stool    Parkinson's disease: continue on home dose of carbidopa-levodopa, aricept & seroquel.  Acute metabolic encephalopathy: etiology unclear infection vs delirium vs Parkinson's dementia. Blood & urine cx ordered. Re-orient prn   Malignant melanoma of the skin: management per onco as an outpatient    Bradycardia: continue on tele. Not on any BB or CCB as per med rec    Falls/generalized weakness: PT/OT consulted   Thrombocytopenia: etiology unclear. Will continue to monitor    DVT prophylaxis: heparin  Code Status: full  Family Communication: discussed pt's care w/ pt's wife and answered her questions  Disposition Plan: depends on PT/OT recs   Level of care: Telemetry Medical  Status is: Inpatient  Remains inpatient appropriate because: severity of illness    Consultants:    Procedures:   Antimicrobials:   Subjective: Pt is very confused   Objective: Vitals:   07/10/21 1000 07/10/21 1132 07/10/21 1234 07/10/21 1236  BP: (!) 152/72 133/71 (!) 118/58   Pulse: 80 66 (!) 56   Resp: 20 14 20    Temp:  99.6 F (37.6 C) (!) 101.8 F (38.8 C) (!) 101.2 F (38.4 C)  TempSrc:  Oral    SpO2: 93% 91% 91%   Weight:      Height:        Intake/Output Summary (Last 24 hours) at 07/10/2021 1443 Last data filed at 07/10/2021 0700 Gross per 24 hour  Intake 1802.5 ml  Output --  Net 1802.5 ml   Filed Weights    07/09/21 1334  Weight: 79.4 kg    Examination:  General exam: Appears calm but uncomfortable.   Respiratory system: diminished breath sounds b/l Cardiovascular system: S1 & S2 +. No  rubs, gallops or clicks. No pedal edema. Gastrointestinal system: Abdomen is nondistended, soft and nontender. Normal bowel sounds heard. Central nervous system: Alert and awake. Moves all extremities  Psychiatry: Judgement and insight appear poor. Flat mood and affect    Data Reviewed: I have personally reviewed following labs and imaging studies  CBC: Recent Labs  Lab 07/09/21 1337 07/09/21 2016 07/10/21 0801  WBC 6.5 5.1 4.4  NEUTROABS 5.0  --   --   HGB 12.4* 13.0 12.5*  HCT 35.9* 38.9* 37.5*  MCV 90.2 89.4 89.9  PLT 138* 127* 124*   Basic Metabolic Panel: Recent Labs  Lab 07/09/21 1337 07/09/21 2016 07/10/21 0801  NA 135  --  140  K 3.7  --  3.7  CL 107  --  110  CO2 22  --  24  GLUCOSE 106*  --  88  BUN 24*  --  17  CREATININE 1.11 1.02 0.98  CALCIUM 8.5*  --  8.4*   GFR: Estimated Creatinine Clearance: 67.1 mL/min (by C-G formula based on SCr of 0.98 mg/dL). Liver Function Tests: Recent Labs  Lab 07/09/21 1337 07/10/21 0801  AST 37 34  ALT 12 14  ALKPHOS 63 57  BILITOT 1.0 1.0  PROT 7.0 6.3*  ALBUMIN 3.5 3.0*   No results for input(s): LIPASE, AMYLASE in the last 168 hours. No results for input(s): AMMONIA in the last 168 hours. Coagulation Profile: No results for input(s): INR, PROTIME in the last 168 hours. Cardiac Enzymes: Recent Labs  Lab 07/10/21 0801  CKTOTAL 88   BNP (last 3 results) No results for input(s): PROBNP in the last 8760 hours. HbA1C: No results for input(s): HGBA1C in the last 72 hours. CBG: No results for input(s): GLUCAP in the last 168 hours. Lipid Profile: No results for input(s): CHOL, HDL, LDLCALC, TRIG, CHOLHDL, LDLDIRECT in the last 72 hours. Thyroid Function Tests: Recent Labs    07/09/21 2016  TSH 2.043  FREET4 0.73    Anemia Panel: Recent Labs    07/09/21 2016 07/09/21 2018  VITAMINB12  --  875  FOLATE 26.0  --   FERRITIN 452*  --   TIBC 251  --   IRON 26*  --   RETICCTPCT  --  0.5   Sepsis Labs: Recent Labs  Lab 07/09/21 1337 07/09/21 2016  LATICACIDVEN 0.8 1.1    Recent Results (from the past 240 hour(s))  Resp Panel by RT-PCR (Flu A&B, Covid) Nasopharyngeal Swab     Status: Abnormal   Collection Time: 07/09/21  1:37 PM   Specimen: Nasopharyngeal Swab; Nasopharyngeal(NP) swabs in vial transport medium  Result Value Ref Range Status   SARS Coronavirus 2 by RT PCR NEGATIVE NEGATIVE Final    Comment: (NOTE) SARS-CoV-2 target nucleic acids are NOT DETECTED.  The SARS-CoV-2 RNA is generally detectable in upper respiratory specimens during the acute phase of infection. The lowest concentration of SARS-CoV-2 viral copies this assay can detect is 138 copies/mL. A negative result does not preclude SARS-Cov-2 infection and should not be used as the sole basis for treatment or other patient management decisions. A negative result may occur with  improper specimen collection/handling, submission of specimen other than nasopharyngeal swab, presence of viral mutation(s) within the areas targeted by this assay, and inadequate number of viral copies(<138 copies/mL). A negative result must be combined with clinical observations, patient history, and epidemiological information. The expected result is Negative.  Fact Sheet for Patients:  EntrepreneurPulse.com.au  Fact Sheet for Healthcare Providers:  IncredibleEmployment.be  This test is no t yet approved or cleared by the [place] FDA and  has been authorized for detection and/or diagnosis of SARS-CoV-2 by FDA under an Emergency Use Authorization (EUA). This EUA will remain  in effect (meaning this test can be used) for the duration of the COVID-19 declaration under Section 564(b)(1) of the Act,  21 U.S.C.section 360bbb-3(b)(1), unless the authorization is terminated  or revoked sooner.       Influenza A by PCR POSITIVE (A) NEGATIVE Final   Influenza B by PCR NEGATIVE NEGATIVE Final    Comment: (NOTE) The Xpert Xpress SARS-CoV-2/FLU/RSV plus assay is intended as an aid in the diagnosis of influenza from Nasopharyngeal swab specimens and should not be used as a sole basis for treatment. Nasal washings and aspirates are unacceptable for Xpert Xpress SARS-CoV-2/FLU/RSV testing.  Fact Sheet for Patients: EntrepreneurPulse.com.au  Fact Sheet for Healthcare Providers: IncredibleEmployment.be  This test is not yet approved or cleared by the [place] FDA and has been authorized for detection and/or diagnosis of SARS-CoV-2 by FDA under an Emergency Use Authorization (EUA). This EUA will remain in effect (meaning  this test can be used) for the duration of the COVID-19 declaration under Section 564(b)(1) of the Act, 21 U.S.C. section 360bbb-3(b)(1), unless the authorization is terminated or revoked.  Performed at [facility], [address], [place], [place] [address]          Radiology Studies: DG Chest 2 View  Result Date: 07/09/2021 CLINICAL DATA:  Fever and cough EXAM: CHEST - 2 VIEW COMPARISON:  07/09/2020 FINDINGS: The heart size and mediastinal contours are within normal limits. Both lungs are clear. The visualized skeletal structures are unremarkable. Electrical generator pack in the right chest with lead extending into the neck. This is consistent with a deep brain stimulator as noted on CT. IMPRESSION: No active cardiopulmonary disease. Electronically Signed   By: Franchot Gallo M.D.   On: 07/09/2021 14:00        Scheduled Meds:  carbidopa-levodopa  2 tablet Oral TID   donepezil  10 mg Oral QHS   heparin  5,000 Units Subcutaneous Q8H   multivitamin with minerals  1 tablet Oral Daily   oseltamivir  75 mg  Oral BID   pantoprazole (PROTONIX) IV  40 mg Intravenous Q24H   QUEtiapine  25 mg Oral QHS   sertraline  50 mg Oral Daily   sodium chloride flush  3 mL Intravenous Q12H   tamsulosin  0.4 mg Oral Daily   thiamine injection  100 mg Intravenous Daily   Continuous Infusions:     LOS: 1 day    Time spent: 35 mins     Wyvonnia Dusky, MD [facility] Pager [phone]  If 7PM-7AM, please contact night-coverage 07/10/2021, 2:43 PM

## 2021-07-10 NOTE — Progress Notes (Signed)
Patient noted to have some abnormal rhythms on tele. Asymptomatic. MD notified.  Patient does have an implanted brain stimulator with transmitter implanted into right chest. RA lead moved further away from implantation area for more precise rhythm capture.

## 2021-07-10 NOTE — Progress Notes (Signed)
PHARMACY NOTE:  ANTIMICROBIAL RENAL DOSAGE ADJUSTMENT  Current antimicrobial regimen includes a mismatch between antimicrobial dosage and estimated renal function.  As per policy approved by the Pharmacy & Therapeutics and Medical Executive Committees, the antimicrobial dosage will be adjusted accordingly.  Current antimicrobial dosage:  Tamiflu 30 mg BID  Indication: Influenza  Renal Function:  Estimated Creatinine Clearance: 67.1 mL/min (by C-G formula based on SCr of 0.98 mg/dL).    Antimicrobial dosage has been changed to:  Tamiflu 75 mg BID  Additional comments: CrCl > 60 mL/min   Thank you for allowing pharmacy to be a part of this patient's care.  Benita Gutter, [facility] 07/10/2021 9:41 AM

## 2021-07-10 NOTE — TOC Initial Note (Signed)
Transition of Care (TOC) - Initial/Assessment Note    Patient Details  Name: Wesley Anderson. MRN: [id number] Date of Birth: 1946-08-11  Transition of Care [facility]) CM/SW Contact:    Shelbie Hutching, RN Phone Number: 07/10/2021, 11:15 AM  Clinical Narrative:                 Patient is being admitted to the hospital with weakness, influenza A, and confusion.  RNCM met with patient's wife outside the patient's room.   Patient is from home with his wife, she is his caregiver, patient is mostly independent and walks with a walker but over the past few days she has had to assist him with getting up and going to the bathroom and he fell the other day getting up from the table.  Patient is incontinent and wears depends.  Patient has Parkinson's and is followed by neurology at [facility].  Patient is current with his PCP Dr. Caryl Comes, [facility] [address]   Patient has been to [facility] in the past and wife reports that he did not like it at all and would not like going to any facility.  They have been going to OP PT and she is interested in home health services at discharge.  Patient's wife is also working on getting some private caregivers hired, she has someone set up for Monday but patient will likely still be in the hospital at that time.  TOC will cont to follow and assist with disposition at DC.    Expected Discharge Plan: [place] Barriers to Discharge: Continued Medical Work up   Patient Goals and CMS Choice Patient states their goals for this hospitalization and ongoing recovery are:: Patient delirius, wife wants him back on his medication regimen and to return home with home health services at [facility] CMS Medicare.gov Compare Post Acute Care list provided to:: Patient Represenative (must comment) Choice offered to / list presented to : Spouse  Expected Discharge Plan and Services Expected Discharge Plan: [place]    Discharge Planning Services: CM Consult Post Acute Care Choice: [place] arrangements for the past 2 months: Single Family Home                 DME Arranged: N/A DME Agency: NA                  Prior Living Arrangements/Services Living arrangements for the past 2 months: Single Family Home Lives with:: Spouse Patient language and need for interpreter reviewed:: Yes Do you feel safe going back to the place where you live?: Yes      Need for Family Participation in Patient Care: Yes (Comment) (Parkinson's and Flu) Care giver support system in place?: Yes (comment) (wife and son) Current home services: DME (walker, shower chair) Criminal Activity/Legal Involvement Pertinent to Current Situation/Hospitalization: No - Comment as needed  Activities of Daily Living      Permission Sought/Granted Permission sought to share information with : Case Manager, Family Supports, Other (comment) Permission granted to share information with : Yes, Verbal Permission Granted  Share Information with NAME: Tevin Shillingford  Permission granted to share info w AGENCY: home health agency of choice  Permission granted to share info w Relationship: spouse  Permission granted to share info w Contact Information: [phone]  Emotional Assessment Appearance:: Appears stated age Attitude/Demeanor/Rapport: Other (comment) (delirius) Affect (typically observed): Agitated Orientation: : Oriented to Self Alcohol /  Substance Use: Not Applicable Psych Involvement: No (comment)  Admission diagnosis:  Generalized weakness [R53.1] Patient Active Problem List   Diagnosis Date Noted   Anemia 07/09/2021   Generalized weakness 07/09/2021   Fever and chills 07/09/2021   Myalgia 07/09/2021   Influenza A 06/13/1313   Acute metabolic encephalopathy 38/88/7579   AKI (acute kidney injury) ([place]) 07/09/2020   Leukocytosis 07/09/2020   S/P deep brain stimulator placement 07/09/2020   Squamous cell  carcinoma, 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 04/24/2020   Heme positive stool 05/27/2016   Alcohol dependence in remission ([place]) 07/31/2013   Cognitive impairment 02/22/2013   History of malignant melanoma of skin 05/15/2012   Parkinson's disease ([place]) 09/28/2011   PCP:  Adin Hector, MD [facility]   [id number] [place]Phone: [phone]: [phone]    Social Determinants of Health (SDOH) Interventions    Readmission Risk Interventions No flowsheet data found.

## 2021-07-10 NOTE — Evaluation (Signed)
Physical Therapy Evaluation Patient Details Name: Wesley Anderson. MRN: [id number] DOB: January 18, 1946 Today's Date: 07/10/2021  History of Present Illness  75 y.o. male with history of Parkinson's disease, additional past medical history as noted below who is undergoing chemotherapy for squamous cell carcinoma presents with complaints of weakness, fever  Clinical Impression  Pt eager to work with PT but reports that he has been very weak and this held true during mobility attempts.  He needed assist to get to sitting and even more so to standing.  He was able to maintain some standing at EOB with walker and heavily leaning back of legs on bed but could not shift weight forward or show any ability to maintain standing without leaning back considerably and attempts to side step/get away from bed were unsuccessful and unsafe.      Recommendations for follow up therapy are one component of a multi-disciplinary discharge planning process, led by the attending physician.  Recommendations may be updated based on patient status, additional functional criteria and insurance authorization.  Follow Up Recommendations Skilled nursing-short term rehab (<3 hours/day)    Assistance Recommended at Discharge Frequent or constant Supervision/Assistance  Functional Status Assessment Patient has had a recent decline in their functional status and demonstrates the ability to make significant improvements in function in a reasonable and predictable amount of time.  Equipment Recommendations   (TBD)    Recommendations for Other Services       Precautions / Restrictions Precautions Precautions: Fall Restrictions Weight Bearing Restrictions: No      Mobility  Bed Mobility Overal bed mobility: Needs Assistance Bed Mobility: Supine to Sit;Sit to Supine     Supine to sit: Mod assist Sit to supine: Mod assist   General bed mobility comments: heavy use of UEs to even try to lift trunk and then ultimately  heavy assist to get to and maintain sitting    Transfers Overall transfer level: Needs assistance Equipment used: 1 person hand held assist Transfers: Sit to/from Stand Sit to Stand: Max assist           General transfer comment: Pt struggled to get to standing, leaning back on the bed heavily.    Ambulation/Gait               General Gait Details: Pt unable to maintain standing w/o direct assist to keep from falling back.  Neither safe or appropriate to try ambulation this date.  Stairs            Wheelchair Mobility    Modified Rankin (Stroke Patients Only)       Balance Overall balance assessment: Needs assistance Sitting-balance support: Feet supported;Bilateral upper extremity supported Sitting balance-Leahy Scale: Poor     Standing balance support: During functional activity Standing balance-Leahy Scale: Zero Standing balance comment: pt only able to "maintain" standing with back of legs leaning heavily on bed, poor ability to shift weight through walker despite much cuing                             Pertinent Vitals/Pain Pain Assessment: No/denies pain    Home Living Family/patient expects to be discharged to:: Skilled nursing facility                        Prior Function Prior Level of Function : Patient poor historian/Family not available  Mobility Comments: Apparently until recently he was able to be out of the home and even do some driving but has been too unsteady and weak.  Wife does errands, driving, etc       Hand Dominance   Dominant Hand: Right    Extremity/Trunk Assessment   Upper Extremity Assessment Upper Extremity Assessment: Overall WFL for tasks assessed    Lower Extremity Assessment Lower Extremity Assessment: Overall WFL for tasks assessed       Communication   Communication: No difficulties  Cognition Arousal/Alertness: Awake/alert Behavior During Therapy: WFL for tasks  assessed/performed Overall Cognitive Status: No family/caregiver present to determine baseline cognitive functioning                                 General Comments: Pt oriented to self only. [place] He does follow 1 step commands with increased time.        General Comments General comments (skin integrity, edema, etc.): pt on 2L [place] on arrival, sats in the high 90s, removed O2 t/o session with sats maintaing in the mid 90s t/o the effort    Exercises     Assessment/Plan    PT Assessment Patient needs continued PT services  PT Problem List Decreased mobility;Decreased range of motion;Decreased strength;Decreased activity tolerance;Decreased balance;Decreased safety awareness;Decreased knowledge of use of DME;Pain       PT Treatment Interventions Gait training;Functional mobility training;Therapeutic activities;Balance training;Therapeutic exercise;Neuromuscular re-education;Patient/family education    PT Goals (Current goals can be found in the Care Plan section)  Acute Rehab PT Goals Patient Stated Goal: go home PT Goal Formulation: With patient Time For Goal Achievement: 07/24/21 Potential to Achieve Goals: Fair    Frequency Min 2X/week   Barriers to discharge        Co-evaluation               AM-PAC PT "6 Clicks" Mobility  Outcome Measure Help needed turning from your back to your side while in a flat bed without using bedrails?: A Little Help needed moving from lying on your back to sitting on the side of a flat bed without using bedrails?: A Little Help needed moving to and from a bed to a chair (including a wheelchair)?: Total Help needed standing up from a chair using your arms (e.g., wheelchair or bedside chair)?: A Lot Help needed to walk in hospital room?: Total Help needed climbing 3-5 steps with a railing? : Total 6 Click Score: 11    End of Session Equipment Utilized During Treatment: Gait  belt Activity Tolerance: Patient limited by fatigue;Patient tolerated treatment well Patient left: with call bell/phone within reach;in bed Nurse Communication: Mobility status PT Visit Diagnosis: Muscle weakness (generalized) (M62.81);Unsteadiness on feet (R26.81);Difficulty in walking, not elsewhere classified (R26.2)    Time: 5093-2671 PT Time Calculation (min) (ACUTE ONLY): 30 min   Charges:   PT Evaluation $PT Eval Low Complexity: 1 Low PT Treatments $Therapeutic Activity: 8-22 mins        Kreg Shropshire, DPT 07/10/2021, 11:53 AM

## 2021-07-10 NOTE — Progress Notes (Signed)
Upon admission to 1C MEWS score 2 d/t fever 101.8. 2nd check temp was 101.2. Patient is resting in bed.  Denies pain.  Face flushed.  Will give tylenol.  VS per yellow MEWS protocol initiated.  Spouse at the bedside.

## 2021-07-10 NOTE — Evaluation (Signed)
Occupational Therapy Evaluation Patient Details Name: Wesley Anderson. MRN: [id number] DOB: 1946-03-17 Today's Date: 07/10/2021   History of Present Illness 75 y.o. male with history of Parkinson's disease, additional past medical history as noted below who is undergoing chemotherapy for squamous cell carcinoma presents with complaints of weakness, fever   Clinical Impression   Patient presenting with decreased Ind in self care, balance, functional mobility/transfers, endurance, and safety awareness. Patient is very confused and impulsive with mobility this session. He is oriented to self only but does follow 1 step commands with increased time and cuing.Per chart and reports, pt is able to care for himself at baseline with wife assisting with IADL tasks. Pt required max A to EOB and Max A to clear buttocks in standing. Pt is very tremulous all over with mobility tasks and strong posterior bias making it unsafe to advance to transfers/steps. Pt needing min - mod A for static sitting balance on EOB. Hand over hand assistance to initiate washing face.Patient will benefit from acute OT to increase overall independence in the areas of ADLs, functional mobility, and safety awareness in order to safely discharge to next venue of care.      Recommendations for follow up therapy are one component of a multi-disciplinary discharge planning process, led by the attending physician.  Recommendations may be updated based on patient status, additional functional criteria and insurance authorization.   Follow Up Recommendations  Skilled nursing-short term rehab (<3 hours/day)    Assistance Recommended at Discharge Frequent or constant Supervision/Assistance  Functional Status Assessment  Patient has had a recent decline in their functional status and demonstrates the ability to make significant improvements in function in a reasonable and predictable amount of time.  Equipment Recommendations  Other  (comment) (defer to next venue of care)       Precautions / Restrictions Precautions Precautions: Fall Restrictions Weight Bearing Restrictions: No      Mobility Bed Mobility Overal bed mobility: Needs Assistance Bed Mobility: Supine to Sit;Sit to Supine     Supine to sit: Mod assist Sit to supine: Mod assist   General bed mobility comments: heavy use of UEs to even try to lift trunk and then ultimately heavy assist to get to and maintain sitting    Transfers Overall transfer level: Needs assistance Equipment used: 1 person hand held assist Transfers: Sit to/from Stand Sit to Stand: Max assist           General transfer comment: Pt struggled to get to standing, leaning back on the bed heavily.      Balance Overall balance assessment: Needs assistance Sitting-balance support: Feet supported;Bilateral upper extremity supported Sitting balance-Leahy Scale: Poor     Standing balance support: During functional activity Standing balance-Leahy Scale: Zero                             ADL either performed or assessed with clinical judgement   ADL Overall ADL's : Needs assistance/impaired                                       General ADL Comments: Pt requires max A to stand from EOB and is very tremulous. He is confused. Needing min - mod A for static sitting balance. Unsafe to attempt steps or transfer. Pt has not had parkinson's medication at time of evaluation. Max- total  A for LB self care.     Vision Baseline Vision/History: 1 Wears glasses Patient Visual Report: No change from baseline              Pertinent Vitals/Pain Pain Assessment: No/denies pain     Hand Dominance Right   Extremity/Trunk Assessment Upper Extremity Assessment Upper Extremity Assessment: Overall WFL for tasks assessed   Lower Extremity Assessment Lower Extremity Assessment: Overall WFL for tasks assessed       Communication  Communication Communication: No difficulties   Cognition Arousal/Alertness: Awake/alert Behavior During Therapy: WFL for tasks assessed/performed Overall Cognitive Status: No family/caregiver present to determine baseline cognitive functioning                                 General Comments: Pt oriented to self only. [place] He does follow 1 step commands with increased time.                Home Living Family/patient expects to be discharged to:: Skilled nursing facility                                        Prior Functioning/Environment Prior Level of Function : Patient poor historian/Family not available             Mobility Comments: Apparently until recently he was able to be out of the home and even do some driving but has been too unsteady and weak.  Wife does errands, driving, etc          OT Problem List: Decreased strength;Decreased knowledge of use of DME or AE;Decreased activity tolerance;Decreased cognition;Decreased safety awareness;Impaired balance (sitting and/or standing)      OT Treatment/Interventions: Self-care/ADL training;Manual therapy;Therapeutic exercise;Patient/family education;Modalities;Balance training;Energy conservation;Therapeutic activities;Cognitive remediation/compensation;DME and/or AE instruction    OT Goals(Current goals can be found in the care plan section) Acute Rehab OT Goals Patient Stated Goal: to "get out of here" OT Goal Formulation: With patient Time For Goal Achievement: 07/24/21 Potential to Achieve Goals: Fair ADL Goals Pt Will Perform Grooming: with supervision;sitting Pt Will Perform Lower Body Dressing: with min assist;sit to/from stand Pt Will Transfer to Toilet: with min assist;ambulating Pt Will Perform Toileting - Clothing Manipulation and hygiene: with min assist;sit to/from stand  OT Frequency: Min 2X/week   Barriers to D/C:     family likely unable to provide this level of care at home.          AM-PAC OT "6 Clicks" Daily Activity     Outcome Measure Help from another person eating meals?: A Little Help from another person taking care of personal grooming?: A Little Help from another person toileting, which includes using toliet, bedpan, or urinal?: Total Help from another person bathing (including washing, rinsing, drying)?: Total Help from another person to put on and taking off regular upper body clothing?: A Lot Help from another person to put on and taking off regular lower body clothing?: Total 6 Click Score: 11   End of Session Nurse Communication: Mobility status  Activity Tolerance: Patient limited by fatigue Patient left: in bed;with call bell/phone within reach;with bed alarm set  OT Visit Diagnosis: Unsteadiness on feet (R26.81);Repeated falls (R29.6);Muscle weakness (generalized) (M62.81)                Time: 8938-1017 OT Time  Calculation (min): 14 min Charges:  OT General Charges $OT Visit: 1 Visit OT Evaluation $OT Eval Moderate Complexity: [address], [place], OTR/L , [facility] ascom [phone]  07/10/21, 11:32 AM

## 2021-07-11 DIAGNOSIS — R531 Weakness: Secondary | ICD-10-CM | POA: Diagnosis not present

## 2021-07-11 DIAGNOSIS — J101 Influenza due to other identified influenza virus with other respiratory manifestations: Secondary | ICD-10-CM | POA: Diagnosis not present

## 2021-07-11 DIAGNOSIS — G9341 Metabolic encephalopathy: Secondary | ICD-10-CM | POA: Diagnosis not present

## 2021-07-11 LAB — CBC
HCT: 35.9 % — ABNORMAL LOW (ref 39.0–52.0)
Hemoglobin: 12.1 g/dL — ABNORMAL LOW (ref 13.0–17.0)
MCH: 29.6 pg (ref 26.0–34.0)
MCHC: 33.7 g/dL (ref 30.0–36.0)
MCV: 87.8 fL (ref 80.0–100.0)
Platelets: 133 10*3/uL — ABNORMAL LOW (ref 150–400)
RBC: 4.09 MIL/uL — ABNORMAL LOW (ref 4.22–5.81)
RDW: 13.4 % (ref 11.5–15.5)
WBC: 5.6 10*3/uL (ref 4.0–10.5)
nRBC: 0 % (ref 0.0–0.2)

## 2021-07-11 LAB — BASIC METABOLIC PANEL
Anion gap: 6 (ref 5–15)
BUN: 15 mg/dL (ref 8–23)
CO2: 24 mmol/L (ref 22–32)
Calcium: 8.4 mg/dL — ABNORMAL LOW (ref 8.9–10.3)
Chloride: 109 mmol/L (ref 98–111)
Creatinine, Ser: 0.88 mg/dL (ref 0.61–1.24)
GFR, Estimated: >60 mL/min (ref >60)
Glucose, Bld: 93 mg/dL (ref 70–99)
Potassium: 3.5 mmol/L (ref 3.5–5.1)
Sodium: 139 mmol/L (ref 135–145)

## 2021-07-11 MED ORDER — PANTOPRAZOLE SODIUM 40 MG PO TBEC
40.0000 mg | DELAYED_RELEASE_TABLET | Freq: Every day | ORAL | Status: DC
  Administered 2021-07-11 – 2021-07-13 (×3): 40 mg via ORAL
  Filled 2021-07-11 (×3): qty 1

## 2021-07-11 NOTE — Progress Notes (Signed)
PHARMACIST - PHYSICIAN COMMUNICATION  CONCERNING: IV to Oral Route Change Policy  RECOMMENDATION: This patient is receiving pantoprazole by the intravenous route.  Based on criteria approved by the Pharmacy and Therapeutics Committee, the intravenous medication(s) is/are being converted to the equivalent oral dose form(s).   DESCRIPTION: These criteria include: The patient is eating (either orally or via tube) and/or has been taking other orally administered medications for a least 24 hours The patient has no evidence of active gastrointestinal bleeding or impaired GI absorption (gastrectomy, short bowel, patient on TNA or NPO).  If you have questions about this conversion, please contact the [place], [facility] 07/11/2021 11:59 AM

## 2021-07-11 NOTE — Progress Notes (Signed)
PROGRESS NOTE    Wesley Anderson.  TKZ:[id number] DOB: 12-25-1945 DOA: 07/09/2021 PCP: Adin Hector, MD   Assessment & Plan:   Principal Problem:   Generalized weakness Active Problems:   Parkinson's disease (Wesley Anderson)   History of malignant melanoma of skin   Heme positive stool   Anemia   Fever and chills   Myalgia   Influenza A   Influenza A: continue on tamiflu. Continue w/ supportive care. Droplet precautions    Normocytic anemia: H&H are stable. No need for a transfusion or fecal occult. Can f/u outpatient w/ GI for hx of heme positive stool    Parkinson's disease: continue on home dose of sinemet, seroquel, aricept  Acute metabolic encephalopathy: etiology unclear infection vs delirium vs Parkinson's dementia. Oriented to person, place only. Blood cxs NGTD. Urine cx is pending   Malignant melanoma of the skin: management per onco as an outpatient    Bradycardia: continue on tele. Not on any BB or CCB as per med rec    Falls/generalized weakness: PT/OT recs SNF but pt's wife stating the pt would not agree to go to SNF & wants home health instead  Thrombocytopenia: labile. Etiology unclear     DVT prophylaxis: heparin  Code Status: full  Family Communication: discussed pt's care w/ pt's wife and answered her questions  Disposition Plan: PT/OT recs SNF but pt's wife stating the pt would not agree to go to SNF & wants home health instead  Level of care: Telemetry Medical  Status is: Inpatient  Remains inpatient appropriate because: severity of illness, spiking fevers and still confused     Consultants:    Procedures:   Antimicrobials:   Subjective: Pt c/o about not wanting to come to the hospital  Objective: Vitals:   07/10/21 1500 07/10/21 2023 07/11/21 0053 07/11/21 0414  BP: 122/70 (!) 123/91 128/72 122/67  Pulse: (!) 50 (!) 54 (!) 52 (!) 52  Resp: 18 18 18 15   Temp: 99 F (37.2 C) 98.6 F (37 C) 98.8 F (37.1 C) 98.7 F (37.1 C)   TempSrc: Oral Oral Oral   SpO2: 94% 95% 95% 92%  Weight:      Height:        Intake/Output Summary (Last 24 hours) at 07/11/2021 0739 Last data filed at 07/11/2021 0414 Gross per 24 hour  Intake 240 ml  Output 650 ml  Net -410 ml   Filed Weights   07/09/21 1334  Weight: 79.4 kg    Examination:  General exam: Appears agitated and frustrated  Respiratory system: decreased breath sounds b/l  Cardiovascular system: S1/S2+. No rubs or clicks  Gastrointestinal system: Abdomen is nondistended, soft and nontender. Normal bowel sounds heard. Central nervous system: Alert and awake. Moves all extremities  Psychiatry: Judgement and insight appear poor. Agitated and frustrated     Data Reviewed: I have personally reviewed following labs and imaging studies  CBC: Recent Labs  Lab 07/09/21 1337 07/09/21 2016 07/10/21 0801 07/11/21 0351  WBC 6.5 5.1 4.4 5.6  NEUTROABS 5.0  --   --   --   HGB 12.4* 13.0 12.5* 12.1*  HCT 35.9* 38.9* 37.5* 35.9*  MCV 90.2 89.4 89.9 87.8  PLT 138* 127* 124* 573*   Basic Metabolic Panel: Recent Labs  Lab 07/09/21 1337 07/09/21 2016 07/10/21 0801 07/11/21 0351  NA 135  --  140 139  K 3.7  --  3.7 3.5  CL 107  --  110 109  CO2 22  --  24 24  GLUCOSE 106*  --  88 93  BUN 24*  --  17 15  CREATININE 1.11 1.02 0.98 0.88  CALCIUM 8.5*  --  8.4* 8.4*   GFR: Estimated Creatinine Clearance: 74.8 mL/min (by C-G formula based on SCr of 0.88 mg/dL). Liver Function Tests: Recent Labs  Lab 07/09/21 1337 07/10/21 0801  AST 37 34  ALT 12 14  ALKPHOS 63 57  BILITOT 1.0 1.0  PROT 7.0 6.3*  ALBUMIN 3.5 3.0*   No results for input(s): LIPASE, AMYLASE in the last 168 hours. No results for input(s): AMMONIA in the last 168 hours. Coagulation Profile: No results for input(s): INR, PROTIME in the last 168 hours. Cardiac Enzymes: Recent Labs  Lab 07/10/21 0801  CKTOTAL 88   BNP (last 3 results) No results for input(s): PROBNP in the last  8760 hours. HbA1C: No results for input(s): HGBA1C in the last 72 hours. CBG: No results for input(s): GLUCAP in the last 168 hours. Lipid Profile: No results for input(s): CHOL, HDL, LDLCALC, TRIG, CHOLHDL, LDLDIRECT in the last 72 hours. Thyroid Function Tests: Recent Labs    07/09/21 2016  TSH 2.043  FREET4 0.73   Anemia Panel: Recent Labs    07/09/21 2016 07/09/21 2018  VITAMINB12  --  875  FOLATE 26.0  --   FERRITIN 452*  --   TIBC 251  --   IRON 26*  --   RETICCTPCT  --  0.5   Sepsis Labs: Recent Labs  Lab 07/09/21 1337 07/09/21 2016  LATICACIDVEN 0.8 1.1    Recent Results (from the past 240 hour(s))  Resp Panel by RT-PCR (Flu A&B, Covid) Nasopharyngeal Swab     Status: Abnormal   Collection Time: 07/09/21  1:37 PM   Specimen: Nasopharyngeal Swab; Nasopharyngeal(NP) swabs in vial transport medium  Result Value Ref Range Status   SARS Coronavirus 2 by RT PCR NEGATIVE NEGATIVE Final    Comment: (NOTE) SARS-CoV-2 target nucleic acids are NOT DETECTED.  The SARS-CoV-2 RNA is generally detectable in upper respiratory specimens during the acute phase of infection. The lowest concentration of SARS-CoV-2 viral copies this assay can detect is 138 copies/mL. A negative result does not preclude SARS-Cov-2 infection and should not be used as the sole basis for treatment or other patient management decisions. A negative result may occur with  improper specimen collection/handling, submission of specimen other than nasopharyngeal swab, presence of viral mutation(s) within the areas targeted by this assay, and inadequate number of viral copies(<138 copies/mL). A negative result must be combined with clinical observations, patient history, and epidemiological information. The expected result is Negative.  Fact Sheet for Patients:  EntrepreneurPulse.com.au  Fact Sheet for Healthcare Providers:  IncredibleEmployment.be  This test is  no t yet approved or cleared by the [place] FDA and  has been authorized for detection and/or diagnosis of SARS-CoV-2 by FDA under an Emergency Use Authorization (EUA). This EUA will remain  in effect (meaning this test can be used) for the duration of the COVID-19 declaration under Section 564(b)(1) of the Act, 21 U.S.C.section 360bbb-3(b)(1), unless the authorization is terminated  or revoked sooner.       Influenza A by PCR POSITIVE (A) NEGATIVE Final   Influenza B by PCR NEGATIVE NEGATIVE Final    Comment: (NOTE) The Xpert Xpress SARS-CoV-2/FLU/RSV plus assay is intended as an aid in the diagnosis of influenza from Nasopharyngeal swab specimens and should not be used as a sole basis for treatment. Nasal washings  and aspirates are unacceptable for Xpert Xpress SARS-CoV-2/FLU/RSV testing.  Fact Sheet for Patients: EntrepreneurPulse.com.au  Fact Sheet for Healthcare Providers: IncredibleEmployment.be  This test is not yet approved or cleared by the [place] FDA and has been authorized for detection and/or diagnosis of SARS-CoV-2 by FDA under an Emergency Use Authorization (EUA). This EUA will remain in effect (meaning this test can be used) for the duration of the COVID-19 declaration under Section 564(b)(1) of the Act, 21 U.S.C. section 360bbb-3(b)(1), unless the authorization is terminated or revoked.  Performed at [facility], [address] [place], [place] [address]   CULTURE, BLOOD (ROUTINE X 2) w Reflex to ID Panel     Status: None (Preliminary result)   Collection Time: 07/10/21  1:01 PM   Specimen: BLOOD  Result Value Ref Range Status   Specimen Description BLOOD BLOOD LEFT WRIST  Final   Special Requests   Final    BOTTLES DRAWN AEROBIC AND ANAEROBIC Blood Culture results may not be optimal due to an excessive volume of blood received in culture bottles   Culture   Final    NO GROWTH < 24 HOURS Performed  at [facility], [address], [place], [place] [address]    Report Status PENDING  Incomplete  CULTURE, BLOOD (ROUTINE X 2) w Reflex to ID Panel     Status: None (Preliminary result)   Collection Time: 07/10/21  1:01 PM   Specimen: BLOOD  Result Value Ref Range Status   Specimen Description BLOOD BLOOD RIGHT WRIST  Final   Special Requests   Final    BOTTLES DRAWN AEROBIC AND ANAEROBIC Blood Culture adequate volume   Culture   Final    NO GROWTH < 24 HOURS Performed at [facility], [address] [place], [place] [address]    Report Status PENDING  Incomplete         Radiology Studies: DG Chest 2 View  Result Date: 07/09/2021 CLINICAL DATA:  Fever and cough EXAM: CHEST - 2 VIEW COMPARISON:  07/09/2020 FINDINGS: The heart size and mediastinal contours are within normal limits. Both lungs are clear. The visualized skeletal structures are unremarkable. Electrical generator pack in the right chest with lead extending into the neck. This is consistent with a deep brain stimulator as noted on CT. IMPRESSION: No active cardiopulmonary disease. Electronically Signed   By: Franchot Gallo M.D.   On: 07/09/2021 14:00        Scheduled Meds:  carbidopa-levodopa  2 tablet Oral TID   donepezil  10 mg Oral QHS   heparin  5,000 Units Subcutaneous Q8H   multivitamin with minerals  1 tablet Oral Daily   oseltamivir  75 mg Oral BID   pantoprazole (PROTONIX) IV  40 mg Intravenous Q24H   QUEtiapine  25 mg Oral QHS   sertraline  50 mg Oral Daily   sodium chloride flush  3 mL Intravenous Q12H   tamsulosin  0.4 mg Oral Daily   thiamine injection  100 mg Intravenous Daily   Continuous Infusions:     LOS: 2 days    Time spent: 32 mins     Wyvonnia Dusky, MD [facility] Pager [phone]  If 7PM-7AM, please contact night-coverage 07/11/2021, 7:39 AM

## 2021-07-12 ENCOUNTER — Ambulatory Visit: Payer: Medicare HMO | Admitting: Physical Therapy

## 2021-07-12 DIAGNOSIS — R531 Weakness: Secondary | ICD-10-CM | POA: Diagnosis not present

## 2021-07-12 DIAGNOSIS — G9341 Metabolic encephalopathy: Secondary | ICD-10-CM | POA: Diagnosis not present

## 2021-07-12 DIAGNOSIS — J101 Influenza due to other identified influenza virus with other respiratory manifestations: Secondary | ICD-10-CM | POA: Diagnosis not present

## 2021-07-12 LAB — BASIC METABOLIC PANEL
Anion gap: 6 (ref 5–15)
BUN: 15 mg/dL (ref 8–23)
CO2: 26 mmol/L (ref 22–32)
Calcium: 8.8 mg/dL — ABNORMAL LOW (ref 8.9–10.3)
Chloride: 108 mmol/L (ref 98–111)
Creatinine, Ser: 1.05 mg/dL (ref 0.61–1.24)
GFR, Estimated: >60 mL/min (ref >60)
Glucose, Bld: 86 mg/dL (ref 70–99)
Potassium: 4.1 mmol/L (ref 3.5–5.1)
Sodium: 140 mmol/L (ref 135–145)

## 2021-07-12 LAB — CBC
HCT: 36.9 % — ABNORMAL LOW (ref 39.0–52.0)
Hemoglobin: 12.5 g/dL — ABNORMAL LOW (ref 13.0–17.0)
MCH: 29.8 pg (ref 26.0–34.0)
MCHC: 33.9 g/dL (ref 30.0–36.0)
MCV: 87.9 fL (ref 80.0–100.0)
Platelets: 148 10*3/uL — ABNORMAL LOW (ref 150–400)
RBC: 4.2 MIL/uL — ABNORMAL LOW (ref 4.22–5.81)
RDW: 13.4 % (ref 11.5–15.5)
WBC: 5.4 10*3/uL (ref 4.0–10.5)
nRBC: 0 % (ref 0.0–0.2)

## 2021-07-12 LAB — URINE CULTURE

## 2021-07-12 MED ORDER — THIAMINE HCL 100 MG PO TABS
100.0000 mg | ORAL_TABLET | Freq: Every day | ORAL | Status: DC
  Administered 2021-07-13 – 2021-07-14 (×2): 100 mg via ORAL
  Filled 2021-07-12 (×2): qty 1

## 2021-07-12 NOTE — TOC Progression Note (Signed)
Transition of Care (TOC) - Progression Note    Patient Details  Name: Wesley Anderson. MRN: [id number] Date of Birth: March 20, 1946  Transition of Care [facility]) CM/SW Contact  Shelbie Hutching, RN Phone Number: 07/12/2021, 10:36 AM  Clinical Narrative:    Patient and family decline SNF and choose home with home health services.  No preference in agency.  [place] is in network with [facility] and has availability as soon as tomorrow for nursing.   Referral emailed to [place] at [facility].  [facility] referral for RN, PT,and OT.    Expected Discharge Plan: [place] Barriers to Discharge: Continued Medical Work up  Expected Discharge Plan and Services Expected Discharge Plan: [place]   Discharge Planning Services: CM Consult Post Acute Care Choice: [place] arrangements for the past 2 months: Single Family Home                 DME Arranged: N/A DME Agency: NA                   Social Determinants of Health (SDOH) Interventions    Readmission Risk Interventions No flowsheet data found.

## 2021-07-12 NOTE — TOC Progression Note (Signed)
Transition of Care (TOC) - Progression Note    Patient Details  Name: Wesley Anderson. MRN: [id number] Date of Birth: 1945-09-06  Transition of Care [facility]) CM/SW Contact  Shelbie Hutching, RN Phone Number: 07/12/2021, 6:37 PM  Clinical Narrative:    Met with patient and spouse at the bedside this morning.  Patient is feeling much better.  He wants to go home at discharge.  He and the spouse agree that being at home will be better than rehab.  They agree to home health services and they are going to get [facility] set up for personal care services.  They did not have a preference of [facility] was 5 days out, [facility] does not have nursing, [facility] also 5 days out and no OT services,   [facility] can see patient as soon as tomorrow or Wed.  Referral emailed to Mali Ortman at [place].      Expected Discharge Plan: [place] Barriers to Discharge: Continued Medical Work up  Expected Discharge Plan and Services Expected Discharge Plan: [place]   Discharge Planning Services: CM Consult Post Acute Care Choice: [place] arrangements for the past 2 months: Single Family Home                 DME Arranged: N/A DME Agency: NA       HH Arranged: RN, PT, OT, Nurse's Aide [place] Agency: [place] Date [place]: 07/12/21 Time [place]: 1100 Representative spoke with at [place]: Mali Ortman [phone] or [phone]   Social Determinants of Health ([place]) Interventions    Readmission Risk Interventions No flowsheet data found.

## 2021-07-12 NOTE — Progress Notes (Signed)
PROGRESS NOTE    Wesley Anderson.  KGM:[id number] DOB: 1946/02/11 DOA: 07/09/2021 PCP: Adin Hector, MD   Assessment & Plan:   Principal Problem:   Generalized weakness Active Problems:   Parkinson's disease ([place])   History of malignant melanoma of skin   Heme positive stool   Anemia   Fever and chills   Myalgia   Influenza A   Influenza A: continue on tamiflu. Continue w/ supportive care. Droplet precautions    Normocytic anemia: H&H are labile. No need for a transfusion or fecal occult. Can f/u outpatient w/ GI for hx of heme positive stool    Parkinson's disease: continue on sinemet, aricept, & seroquel    Acute metabolic encephalopathy: etiology unclear infection vs delirium vs Parkinson's dementia. Blood cxs NGTD. Urine cxs shows likely containment. Mental status is back baseline   Malignant melanoma of the skin: management per onco as an outpatient    Bradycardia: continue on tele. Not on any CCB or BB as per med rec  Falls/generalized weakness: PT/OT recs SNF but pt's wife stating the pt would not agree to go to SNF & wants home health instead  Thrombocytopenia: etiology unclear, labile    DVT prophylaxis: heparin  Code Status: full  Family Communication: discussed pt's care w/ pt's wife and answered her questions  Disposition Plan: PT/OT recs SNF but pt's wife stating the pt would not agree to go to SNF & wants home health instead  Level of care: Telemetry Medical  Status is: Inpatient  Remains inpatient appropriate because: severity of illness, spiking fevers and still confused     Consultants:    Procedures:   Antimicrobials:   Subjective: Pt c/o fatigue   Objective: Vitals:   07/11/21 1129 07/11/21 1711 07/11/21 2004 07/12/21 0435  BP: (!) 146/83 (!) 153/84 (!) 145/74 129/83  Pulse: 61 (!) 54 (!) 52 (!) 55  Resp: 17 16 18 20   Temp: 97.9 F (36.6 C) 98.2 F (36.8 C) 98.3 F (36.8 C) 97.6 F (36.4 C)  TempSrc:   Oral Oral   SpO2: 93% 95% 95% 92%  Weight:      Height:        Intake/Output Summary (Last 24 hours) at 07/12/2021 0727 Last data filed at 07/12/2021 0023 Gross per 24 hour  Intake --  Output 675 ml  Net -675 ml   Filed Weights   07/09/21 1334  Weight: 79.4 kg    Examination:  General exam: Appears calm & comfortable  Respiratory system: diminished breath sounds b/l Cardiovascular system: S1 & S2+. No rubs or clicks  Gastrointestinal system: Abd is soft, NT, ND & hypoactive bowel sounds  Central nervous system: Alert and oriented. Moves all extremities  Psychiatry: judgement and insight is poor. Flat mood and affect     Data Reviewed: I have personally reviewed following labs and imaging studies  CBC: Recent Labs  Lab 07/09/21 1337 07/09/21 2016 07/10/21 0801 07/11/21 0351 07/12/21 0428  WBC 6.5 5.1 4.4 5.6 5.4  NEUTROABS 5.0  --   --   --   --   HGB 12.4* 13.0 12.5* 12.1* 12.5*  HCT 35.9* 38.9* 37.5* 35.9* 36.9*  MCV 90.2 89.4 89.9 87.8 87.9  PLT 138* 127* 124* 133* 644*   Basic Metabolic Panel: Recent Labs  Lab 07/09/21 1337 07/09/21 2016 07/10/21 0801 07/11/21 0351 07/12/21 0428  NA 135  --  140 139 140  K 3.7  --  3.7 3.5 4.1  CL 107  --  110 109 108  CO2 22  --  24 24 26   GLUCOSE 106*  --  88 93 86  BUN 24*  --  17 15 15   CREATININE 1.11 1.02 0.98 0.88 1.05  CALCIUM 8.5*  --  8.4* 8.4* 8.8*   GFR: Estimated Creatinine Clearance: 62.7 mL/min (by C-G formula based on SCr of 1.05 mg/dL). Liver Function Tests: Recent Labs  Lab 07/09/21 1337 07/10/21 0801  AST 37 34  ALT 12 14  ALKPHOS 63 57  BILITOT 1.0 1.0  PROT 7.0 6.3*  ALBUMIN 3.5 3.0*   No results for input(s): LIPASE, AMYLASE in the last 168 hours. No results for input(s): AMMONIA in the last 168 hours. Coagulation Profile: No results for input(s): INR, PROTIME in the last 168 hours. Cardiac Enzymes: Recent Labs  Lab 07/10/21 0801  CKTOTAL 88   BNP (last 3 results) No results for  input(s): PROBNP in the last 8760 hours. HbA1C: No results for input(s): HGBA1C in the last 72 hours. CBG: No results for input(s): GLUCAP in the last 168 hours. Lipid Profile: No results for input(s): CHOL, HDL, LDLCALC, TRIG, CHOLHDL, LDLDIRECT in the last 72 hours. Thyroid Function Tests: Recent Labs    07/09/21 2016  TSH 2.043  FREET4 0.73   Anemia Panel: Recent Labs    07/09/21 2016 07/09/21 2018  VITAMINB12  --  875  FOLATE 26.0  --   FERRITIN 452*  --   TIBC 251  --   IRON 26*  --   RETICCTPCT  --  0.5   Sepsis Labs: Recent Labs  Lab 07/09/21 1337 07/09/21 2016  LATICACIDVEN 0.8 1.1    Recent Results (from the past 240 hour(s))  Resp Panel by RT-PCR (Flu A&B, Covid) Nasopharyngeal Swab     Status: Abnormal   Collection Time: 07/09/21  1:37 PM   Specimen: Nasopharyngeal Swab; Nasopharyngeal(NP) swabs in vial transport medium  Result Value Ref Range Status   SARS Coronavirus 2 by RT PCR NEGATIVE NEGATIVE Final    Comment: (NOTE) SARS-CoV-2 target nucleic acids are NOT DETECTED.  The SARS-CoV-2 RNA is generally detectable in upper respiratory specimens during the acute phase of infection. The lowest concentration of SARS-CoV-2 viral copies this assay can detect is 138 copies/mL. A negative result does not preclude SARS-Cov-2 infection and should not be used as the sole basis for treatment or other patient management decisions. A negative result may occur with  improper specimen collection/handling, submission of specimen other than nasopharyngeal swab, presence of viral mutation(s) within the areas targeted by this assay, and inadequate number of viral copies(<138 copies/mL). A negative result must be combined with clinical observations, patient history, and epidemiological information. The expected result is Negative.  Fact Sheet for Patients:  EntrepreneurPulse.com.au  Fact Sheet for Healthcare Providers:   IncredibleEmployment.be  This test is no t yet approved or cleared by the [place] FDA and  has been authorized for detection and/or diagnosis of SARS-CoV-2 by FDA under an Emergency Use Authorization (EUA). This EUA will remain  in effect (meaning this test can be used) for the duration of the COVID-19 declaration under Section 564(b)(1) of the Act, 21 U.S.C.section 360bbb-3(b)(1), unless the authorization is terminated  or revoked sooner.       Influenza A by PCR POSITIVE (A) NEGATIVE Final   Influenza B by PCR NEGATIVE NEGATIVE Final    Comment: (NOTE) The Xpert Xpress SARS-CoV-2/FLU/RSV plus assay is intended as an aid in the diagnosis of influenza from Nasopharyngeal swab  specimens and should not be used as a sole basis for treatment. Nasal washings and aspirates are unacceptable for Xpert Xpress SARS-CoV-2/FLU/RSV testing.  Fact Sheet for Patients: EntrepreneurPulse.com.au  Fact Sheet for Healthcare Providers: IncredibleEmployment.be  This test is not yet approved or cleared by the [place] FDA and has been authorized for detection and/or diagnosis of SARS-CoV-2 by FDA under an Emergency Use Authorization (EUA). This EUA will remain in effect (meaning this test can be used) for the duration of the COVID-19 declaration under Section 564(b)(1) of the Act, 21 U.S.C. section 360bbb-3(b)(1), unless the authorization is terminated or revoked.  Performed at [facility], [address] [place], [place] [address]   CULTURE, BLOOD (ROUTINE X 2) w Reflex to ID Panel     Status: None (Preliminary result)   Collection Time: 07/10/21  1:01 PM   Specimen: BLOOD  Result Value Ref Range Status   Specimen Description BLOOD BLOOD LEFT WRIST  Final   Special Requests   Final    BOTTLES DRAWN AEROBIC AND ANAEROBIC Blood Culture results may not be optimal due to an excessive volume of blood received in culture  bottles   Culture   Final    NO GROWTH < 24 HOURS Performed at [facility], [address] [place], [place] [address]    Report Status PENDING  Incomplete  CULTURE, BLOOD (ROUTINE X 2) w Reflex to ID Panel     Status: None (Preliminary result)   Collection Time: 07/10/21  1:01 PM   Specimen: BLOOD  Result Value Ref Range Status   Specimen Description BLOOD BLOOD RIGHT WRIST  Final   Special Requests   Final    BOTTLES DRAWN AEROBIC AND ANAEROBIC Blood Culture adequate volume   Culture   Final    NO GROWTH < 24 HOURS Performed at [facility], [address] [place], [place] [address]    Report Status PENDING  Incomplete         Radiology Studies: No results found.      Scheduled Meds:  carbidopa-levodopa  2 tablet Oral TID   donepezil  10 mg Oral QHS   heparin  5,000 Units Subcutaneous Q8H   multivitamin with minerals  1 tablet Oral Daily   oseltamivir  75 mg Oral BID   pantoprazole  40 mg Oral QHS   QUEtiapine  25 mg Oral QHS   sertraline  50 mg Oral Daily   sodium chloride flush  3 mL Intravenous Q12H   tamsulosin  0.4 mg Oral Daily   thiamine injection  100 mg Intravenous Daily   Continuous Infusions:     LOS: 3 days    Time spent: 25 mins     Wyvonnia Dusky, MD [facility] Pager [phone]  If 7PM-7AM, please contact night-coverage 07/12/2021, 7:27 AM

## 2021-07-12 NOTE — Progress Notes (Signed)
Occupational Therapy Treatment Patient Details Name: Wesley Anderson. MRN: [id number] DOB: 1946/02/03 Today's Date: 07/12/2021   History of present illness 75 y.o. male with history of Parkinson's disease, additional past medical history as noted below who is undergoing chemotherapy for squamous cell carcinoma presents with complaints of weakness, fever   OT comments  Pt seen for OT treatment on this date. Upon arrival to room, pt awake and seated upright in bed with wife present. Pt  A&Ox3 and reporting no pain. Pt currently presents with decreased balance, decreased strength, and decreased short term memory. No impulsivity noted this date. Due to above functional impairments, pt requires MIN A for bed mobility, MOD A to don socks while seated EOB, MAX A for sit<>stand transfers, and MIN A for static standing balance d/t posterior lean. Pt attempted to take lateral steps at EOB this date however was unable to clear feet from floor. Once seated, pt engaged in seated grooming tasks, requiring MIN A to steady LUE when bringing water cup to mouth to rinse during oral care d/t tremors. OT educated pt and wife on adaptive feeding utensils (e.g., electronic stabilizing spoon) in setting of UE tremors; wife verbalized understanding. At end of session, pt returned to supine requiring MIN A and was left with all needs within reach. Pt is making good progress toward goals and continues to benefit from skilled OT services to maximize return to PLOF and minimize risk of future falls, injury, caregiver burden, and readmission. Will continue to follow POC. Discharge recommendation remains appropriate.     Recommendations for follow up therapy are one component of a multi-disciplinary discharge planning process, led by the attending physician.  Recommendations may be updated based on patient status, additional functional criteria and insurance authorization.    Follow Up Recommendations  Skilled nursing-short  term rehab (<3 hours/day)    Assistance Recommended at Discharge Frequent or constant Supervision/Assistance  Equipment Recommendations  BSC/3in1       Precautions / Restrictions Precautions Precautions: Fall Restrictions Weight Bearing Restrictions: No       Mobility Bed Mobility Overal bed mobility: Needs Assistance Bed Mobility: Supine to Sit;Sit to Supine     Supine to sit: Min assist Sit to supine: Min assist   General bed mobility comments: During supine>sit, pt requires MIN A to scoot hips toward EOB. During sit>supine, requires MIN A for slightly assisting LE to bed    Transfers Overall transfer level: Needs assistance Equipment used: Rolling walker (2 wheels) Transfers: Sit to/from Stand Sit to Stand: Max assist           General transfer comment: Requires MAX A for upward momentum and initial steadying 2/2 posterior lean. Requires verbal cues for safe hand placement with RW use     Balance Overall balance assessment: Needs assistance Sitting-balance support: Feet supported;No upper extremity supported Sitting balance-Leahy Scale: Good Sitting balance - Comments: Good sitting balance at EOB reaching within BOS   Standing balance support: Bilateral upper extremity supported;During functional activity;Reliant on assistive device for balance Standing balance-Leahy Scale: Poor Standing balance comment: Requires MIN A to maintain static standing 2/2 posterior lean.                           ADL either performed or assessed with clinical judgement   ADL Overall ADL's : Needs assistance/impaired     Grooming: Oral care;Minimal assistance;Wash/dry face;Set up;Sitting Grooming Details (indicate cue type and reason): MIN A to steady  LUE when bringing water cup to mouth to rinse             Lower Body Dressing: Moderate assistance;Sitting/lateral leans Lower Body Dressing Details (indicate cue type and reason): to don/doff socks via figure 4  position                      Cognition Arousal/Alertness: Awake/alert Behavior During Therapy: WFL for tasks assessed/performed Overall Cognitive Status: Within Functional Limits for tasks assessed                                 General Comments: Pt A&Ox3. Disoriented to situation.          Exercises Other Exercises Other Exercises: Educated pt and wife on adaptive feeding utensils (e.g., electronic stabilizing spoon) in setting of UE tremors. Wife verbalized understanding           Pertinent Vitals/ Pain       Pain Assessment: No/denies pain         Frequency  Min 2X/week        Progress Toward Goals  OT Goals(current goals can now be found in the care plan section)  Progress towards OT goals: Progressing toward goals  Acute Rehab OT Goals Patient Stated Goal: to brush my teeth OT Goal Formulation: With patient Time For Goal Achievement: 07/24/21 Potential to Achieve Goals: Good  Plan Discharge plan remains appropriate;Frequency remains appropriate       AM-PAC OT "6 Clicks" Daily Activity     Outcome Measure   Help from another person eating meals?: A Little Help from another person taking care of personal grooming?: A Little Help from another person toileting, which includes using toliet, bedpan, or urinal?: A Lot Help from another person bathing (including washing, rinsing, drying)?: A Lot Help from another person to put on and taking off regular upper body clothing?: A Little Help from another person to put on and taking off regular lower body clothing?: A Lot 6 Click Score: 15    End of Session Equipment Utilized During Treatment: Rolling walker (2 wheels);Gait belt  OT Visit Diagnosis: Unsteadiness on feet (R26.81);Repeated falls (R29.6);Muscle weakness (generalized) (M62.81)   Activity Tolerance Patient tolerated treatment well   Patient Left in bed;with call bell/phone within reach;with bed alarm set   Nurse  Communication Mobility status        Time: 5643-3295 OT Time Calculation (min): 38 min  Charges: OT General Charges $OT Visit: 1 Visit OT Treatments $Self Care/Home Management : 23-37 mins $Therapeutic Activity: 8-22 mins  Fredirick Maudlin, OTR/L [place]

## 2021-07-12 NOTE — Progress Notes (Signed)
PHARMACIST - PHYSICIAN COMMUNICATION  DR:   Dr Lenise Herald  CONCERNING: IV to Oral Route Change Policy  RECOMMENDATION: This patient is receiving Thiamine by the intravenous route.  Based on criteria approved by the Pharmacy and Therapeutics Committee, the intravenous medication(s) is/are being converted to the equivalent oral dose form(s).   DESCRIPTION: These criteria include: The patient is eating (either orally or via tube) and/or has been taking other orally administered medications for a least 24 hours The patient has no evidence of active gastrointestinal bleeding or impaired GI absorption (gastrectomy, short bowel, patient on TNA or NPO).  If you have questions about this conversion, please contact the Pharmacy Department  []   [phone] )  Wesley Anderson [x]   [phone] )  Wesley Anderson []   [phone] )  Wesley Anderson []   [phone] )  Wesley Anderson []   [phone] )  Wesley Anderson PharmD, BCPS 07/12/2021 12:11 PM

## 2021-07-13 DIAGNOSIS — G20 Parkinson's disease: Secondary | ICD-10-CM

## 2021-07-13 DIAGNOSIS — J101 Influenza due to other identified influenza virus with other respiratory manifestations: Secondary | ICD-10-CM | POA: Diagnosis not present

## 2021-07-13 DIAGNOSIS — R531 Weakness: Secondary | ICD-10-CM | POA: Diagnosis not present

## 2021-07-13 LAB — CBC
HCT: 36.9 % — ABNORMAL LOW (ref 39.0–52.0)
Hemoglobin: 12.6 g/dL — ABNORMAL LOW (ref 13.0–17.0)
MCH: 29.7 pg (ref 26.0–34.0)
MCHC: 34.1 g/dL (ref 30.0–36.0)
MCV: 87 fL (ref 80.0–100.0)
Platelets: 169 10*3/uL (ref 150–400)
RBC: 4.24 MIL/uL (ref 4.22–5.81)
RDW: 13.3 % (ref 11.5–15.5)
WBC: 5.8 10*3/uL (ref 4.0–10.5)
nRBC: 0 % (ref 0.0–0.2)

## 2021-07-13 LAB — BASIC METABOLIC PANEL
Anion gap: 6 (ref 5–15)
BUN: 14 mg/dL (ref 8–23)
CO2: 25 mmol/L (ref 22–32)
Calcium: 8.7 mg/dL — ABNORMAL LOW (ref 8.9–10.3)
Chloride: 107 mmol/L (ref 98–111)
Creatinine, Ser: 0.88 mg/dL (ref 0.61–1.24)
GFR, Estimated: >60 mL/min (ref >60)
Glucose, Bld: 115 mg/dL — ABNORMAL HIGH (ref 70–99)
Potassium: 3.3 mmol/L — ABNORMAL LOW (ref 3.5–5.1)
Sodium: 138 mmol/L (ref 135–145)

## 2021-07-13 MED ORDER — DIPHENHYDRAMINE HCL 25 MG PO CAPS
ORAL_CAPSULE | ORAL | Status: AC
  Filled 2021-07-13: qty 1

## 2021-07-13 MED ORDER — ZINC OXIDE 40 % EX OINT
TOPICAL_OINTMENT | CUTANEOUS | Status: DC | PRN
  Filled 2021-07-13 (×2): qty 113

## 2021-07-13 MED ORDER — DIPHENHYDRAMINE HCL 25 MG PO CAPS
25.0000 mg | ORAL_CAPSULE | Freq: Four times a day (QID) | ORAL | Status: DC | PRN
  Administered 2021-07-13: 25 mg via ORAL

## 2021-07-13 MED ORDER — POTASSIUM CHLORIDE CRYS ER 20 MEQ PO TBCR
40.0000 meq | EXTENDED_RELEASE_TABLET | Freq: Once | ORAL | Status: AC
  Administered 2021-07-13: 40 meq via ORAL
  Filled 2021-07-13: qty 2

## 2021-07-13 NOTE — Progress Notes (Signed)
PROGRESS NOTE   HPI was taken from Dr. Florina Ou: Wesley Anderson. is a 75 y.o. male seen in ed with complaints of lysed weakness fevers chills lethargy for the past few days-Monday the 21st.  Reports chest congestion and has been lethargic with fevers. Pt has fallen due to his weakness but has not hurt himself or hit his head.    Pt has past medical history of skin cancer, abdominal aortic aneurysm, GERD, melanoma, Parkinson's disease.   ED Course: In the emergency room patient has hypotensive, dehydrated, currently receiving fluids.   Hospital course from Dr. Jimmye Norman 11/26-11/29/22: Pt was found to have influenza A and was started on influenza. Also, pt was found to be encephalopathic likely secondary to delirium vs Parkinson's dementia. Blood cxs NGTD. Urine cx shows containment. Mental status is now back to baseline. PT/OT recs SNF but pt and pt's wife are refusing SNF but agree to home health. Home health has been set up by CM.     Wesley Anderson.  BMW:[id number] DOB: August 01, 1946 DOA: 07/09/2021 PCP: Adin Hector, MD   Assessment & Plan:   Principal Problem:   Generalized weakness Active Problems:   Parkinson's disease ([place])   History of malignant melanoma of skin   Heme positive stool   Anemia   Fever and chills   Myalgia   Influenza A   Influenza A: continue on tamiflu. Continue w/ supportive care. Droplet precautions   Back rash: etiology unclear. Benadryl prn. May need to see a dermatologist as an outpatient    Normocytic anemia: H&H are labile. No need for a transfusion or fecal occult. Can f/u outpatient w/ GI for hx of heme positive stool    Parkinson's disease: continue on sinemet, seroquel & aricept   Acute metabolic encephalopathy: etiology unclear, infection vs delirium vs Parkinson's dementia. Blood cxs NGTD. Urine cxs shows likely containment. Mental status is back to baseline    Malignant melanoma of the skin: management per [facility] as an  outpatient     Bradycardia: continue on tele. Not on any CCB or BB as per med rec  Falls/generalized weakness: PT/OT recs SNF. Pt and pt's wife are refusing SNF but agree to home health   Thrombocytopenia: resolved    DVT prophylaxis: heparin  Code Status: full  Family Communication: discussed pt's care w/ pt's wife and answered her questions  Disposition Plan: PT/OT recs SNF but pt's wife stating the pt would not agree to go to SNF & wants home health instead  Level of care: Telemetry Medical  Status is: Inpatient  Remains inpatient appropriate because: can be d/c tomorrow if pt remains stable. Medically stable but pt's wife requested the pt stay 1 day more to have PT/OT work with the pt again     Consultants:    Procedures:   Antimicrobials:   Subjective: Pt denies any complaints and wants to go home today   Objective: Vitals:   07/12/21 1600 07/12/21 2131 07/13/21 0103 07/13/21 0741  BP: (!) 149/89 (!) 155/82 (!) 147/78 139/77  Pulse: 64 (!) 59 (!) 52 (!) 54  Resp: 19 16 16 18   Temp: 98.8 F (37.1 C) 98.3 F (36.8 C) 98.6 F (37 C) 98.4 F (36.9 C)  TempSrc: Tympanic Oral Oral   SpO2: 94% 93% 93% 92%  Weight:      Height:        Intake/Output Summary (Last 24 hours) at 07/13/2021 0758 Last data filed at 07/13/2021 2725 Gross  per 24 hour  Intake --  Output 1400 ml  Net -1400 ml   Filed Weights   07/09/21 1334  Weight: 79.4 kg    Examination:  General exam: Appears comfortable Respiratory system: decreased breath sounds otherwise clear  Cardiovascular system: S1/S2+. No rubs or clicks  Gastrointestinal system: Abd is soft, NT, ND & normal bowel sounds  Central nervous system: Alert and oriented. Moves all extremities  Psychiatry: judgement and insight have improved. Appropriate mood and affect     Data Reviewed: I have personally reviewed following labs and imaging studies  CBC: Recent Labs  Lab 07/09/21 1337 07/09/21 2016 07/10/21 0801  07/11/21 0351 07/12/21 0428 07/13/21 0446  WBC 6.5 5.1 4.4 5.6 5.4 5.8  NEUTROABS 5.0  --   --   --   --   --   HGB 12.4* 13.0 12.5* 12.1* 12.5* 12.6*  HCT 35.9* 38.9* 37.5* 35.9* 36.9* 36.9*  MCV 90.2 89.4 89.9 87.8 87.9 87.0  PLT 138* 127* 124* 133* 148* 854   Basic Metabolic Panel: Recent Labs  Lab 07/09/21 1337 07/09/21 2016 07/10/21 0801 07/11/21 0351 07/12/21 0428 07/13/21 0446  NA 135  --  140 139 140 138  K 3.7  --  3.7 3.5 4.1 3.3*  CL 107  --  110 109 108 107  CO2 22  --  24 24 26 25   GLUCOSE 106*  --  88 93 86 115*  BUN 24*  --  17 15 15 14   CREATININE 1.11 1.02 0.98 0.88 1.05 0.88  CALCIUM 8.5*  --  8.4* 8.4* 8.8* 8.7*   GFR: Estimated Creatinine Clearance: 74.8 mL/min (by C-G formula based on SCr of 0.88 mg/dL). Liver Function Tests: Recent Labs  Lab 07/09/21 1337 07/10/21 0801  AST 37 34  ALT 12 14  ALKPHOS 63 57  BILITOT 1.0 1.0  PROT 7.0 6.3*  ALBUMIN 3.5 3.0*   No results for input(s): LIPASE, AMYLASE in the last 168 hours. No results for input(s): AMMONIA in the last 168 hours. Coagulation Profile: No results for input(s): INR, PROTIME in the last 168 hours. Cardiac Enzymes: Recent Labs  Lab 07/10/21 0801  CKTOTAL 88   BNP (last 3 results) No results for input(s): PROBNP in the last 8760 hours. HbA1C: No results for input(s): HGBA1C in the last 72 hours. CBG: No results for input(s): GLUCAP in the last 168 hours. Lipid Profile: No results for input(s): CHOL, HDL, LDLCALC, TRIG, CHOLHDL, LDLDIRECT in the last 72 hours. Thyroid Function Tests: No results for input(s): TSH, T4TOTAL, FREET4, T3FREE, THYROIDAB in the last 72 hours.  Anemia Panel: No results for input(s): VITAMINB12, FOLATE, FERRITIN, TIBC, IRON, RETICCTPCT in the last 72 hours.  Sepsis Labs: Recent Labs  Lab 07/09/21 1337 07/09/21 2016  LATICACIDVEN 0.8 1.1    Recent Results (from the past 240 hour(s))  Resp Panel by RT-PCR (Flu A&B, Covid) Nasopharyngeal Swab      Status: Abnormal   Collection Time: 07/09/21  1:37 PM   Specimen: Nasopharyngeal Swab; Nasopharyngeal(NP) swabs in vial transport medium  Result Value Ref Range Status   SARS Coronavirus 2 by RT PCR NEGATIVE NEGATIVE Final    Comment: (NOTE) SARS-CoV-2 target nucleic acids are NOT DETECTED.  The SARS-CoV-2 RNA is generally detectable in upper respiratory specimens during the acute phase of infection. The lowest concentration of SARS-CoV-2 viral copies this assay can detect is 138 copies/mL. A negative result does not preclude SARS-Cov-2 infection and should not be used as the sole  basis for treatment or other patient management decisions. A negative result may occur with  improper specimen collection/handling, submission of specimen other than nasopharyngeal swab, presence of viral mutation(s) within the areas targeted by this assay, and inadequate number of viral copies(<138 copies/mL). A negative result must be combined with clinical observations, patient history, and epidemiological information. The expected result is Negative.  Fact Sheet for Patients:  EntrepreneurPulse.com.au  Fact Sheet for Healthcare Providers:  IncredibleEmployment.be  This test is no t yet approved or cleared by the [place] FDA and  has been authorized for detection and/or diagnosis of SARS-CoV-2 by FDA under an Emergency Use Authorization (EUA). This EUA will remain  in effect (meaning this test can be used) for the duration of the COVID-19 declaration under Section 564(b)(1) of the Act, 21 U.S.C.section 360bbb-3(b)(1), unless the authorization is terminated  or revoked sooner.       Influenza A by PCR POSITIVE (A) NEGATIVE Final   Influenza B by PCR NEGATIVE NEGATIVE Final    Comment: (NOTE) The Xpert Xpress SARS-CoV-2/FLU/RSV plus assay is intended as an aid in the diagnosis of influenza from Nasopharyngeal swab specimens and should not be used as a  sole basis for treatment. Nasal washings and aspirates are unacceptable for Xpert Xpress SARS-CoV-2/FLU/RSV testing.  Fact Sheet for Patients: EntrepreneurPulse.com.au  Fact Sheet for Healthcare Providers: IncredibleEmployment.be  This test is not yet approved or cleared by the [place] FDA and has been authorized for detection and/or diagnosis of SARS-CoV-2 by FDA under an Emergency Use Authorization (EUA). This EUA will remain in effect (meaning this test can be used) for the duration of the COVID-19 declaration under Section 564(b)(1) of the Act, 21 U.S.C. section 360bbb-3(b)(1), unless the authorization is terminated or revoked.  Performed at [facility], [address], [place], [place] [address]   Urine Culture     Status: Abnormal   Collection Time: 07/09/21  7:28 PM   Specimen: Urine, Clean Catch  Result Value Ref Range Status   Specimen Description   Final    URINE, CLEAN CATCH Performed at [facility], [address], [place], [place] [address]    Special Requests   Final    NONE Performed at [facility], [address] [place],  [address]    Culture MULTIPLE SPECIES PRESENT, SUGGEST RECOLLECTION (A)  Final   Report Status 07/12/2021 FINAL  Final  CULTURE, BLOOD (ROUTINE X 2) w Reflex to ID Panel     Status: None (Preliminary result)   Collection Time: 07/10/21  1:01 PM   Specimen: BLOOD  Result Value Ref Range Status   Specimen Description BLOOD BLOOD LEFT WRIST  Final   Special Requests   Final    BOTTLES DRAWN AEROBIC AND ANAEROBIC Blood Culture results may not be optimal due to an excessive volume of blood received in culture bottles   Culture   Final    NO GROWTH 2 DAYS Performed at [facility], [address], [place], [place] [address]    Report Status PENDING  Incomplete  CULTURE, BLOOD (ROUTINE X 2) w Reflex to ID Panel     Status: None (Preliminary result)    Collection Time: 07/10/21  1:01 PM   Specimen: BLOOD  Result Value Ref Range Status   Specimen Description BLOOD BLOOD RIGHT WRIST  Final   Special Requests   Final    BOTTLES DRAWN AEROBIC AND ANAEROBIC Blood Culture adequate volume   Culture   Final  NO GROWTH 2 DAYS Performed at [facility], [address] [place], [place] [address]    Report Status PENDING  Incomplete         Radiology Studies: No results found.      Scheduled Meds:  carbidopa-levodopa  2 tablet Oral TID   donepezil  10 mg Oral QHS   heparin  5,000 Units Subcutaneous Q8H   multivitamin with minerals  1 tablet Oral Daily   oseltamivir  75 mg Oral BID   pantoprazole  40 mg Oral QHS   QUEtiapine  25 mg Oral QHS   sertraline  50 mg Oral Daily   sodium chloride flush  3 mL Intravenous Q12H   tamsulosin  0.4 mg Oral Daily   thiamine  100 mg Oral Daily   Continuous Infusions:     LOS: 4 days    Time spent: 15 mins     Wyvonnia Dusky, MD [facility] Pager [phone]  If 7PM-7AM, please contact night-coverage 07/13/2021, 7:58 AM

## 2021-07-13 NOTE — Progress Notes (Signed)
Physical Therapy Treatment Patient Details Name: Wesley Anderson. MRN: [id number] DOB: [birth year] Today's Date: 07/13/2021   History of Present Illness 75 y.o. male with history of Parkinson's disease, additional past medical history as noted below who is undergoing chemotherapy for squamous cell carcinoma presents with complaints of weakness, fever    PT Comments    Patient sitting up in recliner chair on arrival to the room. Spouse present at bedside. Patient with improvement in functional independence and activity tolerance this session. He was able to stand from the recliner chair without physical assistance, however needs cues for safety. He ambulated a short distance in the room with rolling walker with minimal assistance for occasional unsteadiness. Further activity tolerance limited by fatigue with activity. Patient would benefit from SNF for continued rehab, however patient and family want to go home tomorrow. They plan to have paid caregiver assistance during the day. Recommend assistance with mobility for fall prevention and HHPT. PT will continue to follow.    Recommendations for follow up therapy are one component of a multi-disciplinary discharge planning process, led by the attending physician.  Recommendations may be updated based on patient status, additional functional criteria and insurance authorization.  Follow Up Recommendations  Skilled nursing-short term rehab (<3 hours/day) (HHPT if family declines rehab)     Assistance Recommended at Discharge Frequent or constant Supervision/Assistance  Equipment Recommendations  Rolling walker (2 wheels);BSC/3in1    Recommendations for Other Services       Precautions / Restrictions Precautions Precautions: Fall Restrictions Weight Bearing Restrictions: No     Mobility  Bed Mobility Overal bed mobility: Needs Assistance Bed Mobility: Supine to Sit     Supine to sit: Min guard     General bed mobility comments:  not assessed as patient sitting up in chair on arrival to room and post session    Transfers Overall transfer level: Needs assistance Equipment used: Rolling walker (2 wheels) Transfers: Sit to/from Stand Sit to Stand: Supervision           General transfer comment: no physical assistance required for standing from recliner chair. verbal cues for hand placement and technique to facilitate independence with transfers    Ambulation/Gait Ambulation/Gait assistance: Min assist Gait Distance (Feet): 25 Feet Assistive device: Rolling walker (2 wheels) Gait Pattern/deviations: Narrow base of support;Trunk flexed Gait velocity: decreased     General Gait Details: verbal cues for proper placement of rolling walker. Min guard assistance provided except with turns that required Min A for steadying. patient fatigued after walking. Sp02 99% on room air and heart rate in the 60's after walking.   Stairs             Wheelchair Mobility    Modified Rankin (Stroke Patients Only)       Balance Overall balance assessment: Needs assistance Sitting-balance support: Feet supported;No upper extremity supported Sitting balance-Leahy Scale: Good Sitting balance - Comments: Good sitting balance at EOB reaching within BOS   Standing balance support: Reliant on assistive device for balance;Bilateral upper extremity supported Standing balance-Leahy Scale: Fair Standing balance comment: patient needs close stand by assistance for safety for static standing, even with brief periods of no UE support                            Cognition Arousal/Alertness: Awake/alert Behavior During Therapy: [facility] for tasks assessed/performed  General Comments: patient is able to follow commands with increased time        Exercises      General Comments General comments (skin integrity, edema, etc.): patient educated on tips for fall prevention  in the home setting and on energy conservation. encouraged patient to use rolling walker with all mobility efforts at home and to have assistance from family/home health staff with mobility at home for fall prevention      Pertinent Vitals/Pain Pain Assessment: No/denies pain    Home Living                          Prior Function            PT Goals (current goals can now be found in the care plan section) Acute Rehab PT Goals Patient Stated Goal: to go home PT Goal Formulation: With patient Time For Goal Achievement: 07/24/21 Potential to Achieve Goals: Fair Progress towards PT goals: Progressing toward goals    Frequency    Min 2X/week      PT Plan Current plan remains appropriate    Co-evaluation              AM-PAC PT "6 Clicks" Mobility   Outcome Measure  Help needed turning from your back to your side while in a flat bed without using bedrails?: A Little Help needed moving from lying on your back to sitting on the side of a flat bed without using bedrails?: A Little Help needed moving to and from a bed to a chair (including a wheelchair)?: A Little Help needed standing up from a chair using your arms (e.g., wheelchair or bedside chair)?: A Little Help needed to walk in hospital room?: A Little Help needed climbing 3-5 steps with a railing? : A Lot 6 Click Score: 17    End of Session Equipment Utilized During Treatment: Gait belt Activity Tolerance: Patient limited by fatigue;Patient tolerated treatment well Patient left: in chair;with call bell/phone within reach;with chair alarm set;with family/visitor present Nurse Communication: Mobility status PT Visit Diagnosis: Muscle weakness (generalized) (M62.81);Unsteadiness on feet (R26.81);Difficulty in walking, not elsewhere classified (R26.2)     Time: 5465-0354 PT Time Calculation (min) (ACUTE ONLY): 38 min  Charges:  $Gait Training: 8-22 mins $Therapeutic Activity: 23-37 mins                      Wesley Anderson, PT, MPT    Wesley Anderson 07/13/2021, 2:56 PM

## 2021-07-13 NOTE — Progress Notes (Signed)
Occupational Therapy Treatment Patient Details Name: Wesley Anderson. MRN: [id number] DOB: 1946/04/10 Today's Date: 07/13/2021   History of present illness 75 y.o. male with history of Parkinson's disease, additional past medical history as noted below who is undergoing chemotherapy for squamous cell carcinoma presents with complaints of weakness, fever   OT comments  Pt in bed upon OT arrival, spouse present.  Pt agreeable to OT session and verbalized desire to walk.  Pt performed multiple sit to stands from EOB min A for 2 trials, min guard for 3rd trial, vc for forward lean during ascent and to grab walker once in standing.  Pt performed functional mobility in room with min A and RW, tolerated standing at sink to brush teeth/comb hair with min A throughout d/t posterior lean.  Cues to widen BOS while standing at sink and for forward weight shifting.  Pt ambulated to opposite side of bed and was left sitting up in recliner, chair alarm on, all needed items within reach, including call light, spouse still present.  OT encouraged pt to sit up for lunch and PT planning to check in on pt after lunch.  Reinforced to use call light to get up from chair to reduce fall risk and OT oriented pt to chair alarm beneath him.     Recommendations for follow up therapy are one component of a multi-disciplinary discharge planning process, led by the attending physician.  Recommendations may be updated based on patient status, additional functional criteria and insurance authorization.    Follow Up Recommendations  Skilled nursing-short term rehab (<3 hours/day)    Assistance Recommended at Discharge Frequent or constant Supervision/Assistance  Equipment Recommendations  BSC/3in1          Precautions / Restrictions Precautions Precautions: Fall Restrictions Weight Bearing Restrictions: No       Mobility Bed Mobility Overal bed mobility: Needs Assistance Bed Mobility: Supine to Sit     Supine  to sit: Min guard     General bed mobility comments: used L/R bedrail supine to sit, then extra time for scooting toward EOB. Patient Response: Cooperative  Transfers Overall transfer level: Needs assistance Equipment used: Rolling walker (2 wheels) Transfers: Sit to/from Stand Sit to Stand: Min assist           General transfer comment: min A sit to stand from EOB for 2 trials, min guard for 3rd trial.  Cues for sequencing and to grab walker once standing, vc for weight shifting forward d/t posterior lean     Balance Overall balance assessment: Needs assistance Sitting-balance support: Feet supported;No upper extremity supported Sitting balance-Leahy Scale: Good Sitting balance - Comments: Good sitting balance at EOB reaching within BOS   Standing balance support: During functional activity;Reliant on assistive device for balance;Single extremity supported;Bilateral upper extremity supported Standing balance-Leahy Scale: Poor Standing balance comment: Requires MIN A to maintain static standing 2/2 posterior lean.                           ADL either performed or assessed with clinical judgement   ADL Overall ADL's : Needs assistance/impaired     Grooming: Oral care;Standing;Minimal assistance;Brushing hair Grooming Details (indicate cue type and reason): constant min A while standing at sink to brush teeth and comb hair d/t posterior lean                             Functional  mobility during ADLs: Minimal assistance General ADL Comments: Performed functional mobility with RW and min A, bed<>sink to stand for grooming.  Seated rest break on EOB while recliner set up was completed.  Pt amb around bed with min A and RW to reach recliner, requiring max sequencing cues to turn, side step, and 1 backward step to move closer to chair.    Extremity/Trunk Assessment Upper Extremity Assessment Upper Extremity Assessment: Overall WFL for tasks assessed    Lower Extremity Assessment Lower Extremity Assessment: Overall WFL for tasks assessed        Vision Baseline Vision/History: 1 Wears glasses Patient Visual Report: No change from baseline                Cognition Arousal/Alertness: Awake/alert Behavior During Therapy: WFL for tasks assessed/performed                                   General Comments: A&Ox4, requires sequencing cues for transfers/mobility                      General Comments room air for duration of session    Pertinent Vitals/ Pain       Pain Assessment: No/denies pain                                                          Frequency  Min 2X/week        Progress Toward Goals  OT Goals(current goals can now be found in the care plan section)  Progress towards OT goals: Progressing toward goals  Acute Rehab OT Goals Patient Stated Goal: to walk OT Goal Formulation: With patient Time For Goal Achievement: 07/24/21 Potential to Achieve Goals: Good  Plan Discharge plan remains appropriate;Frequency remains appropriate                     AM-PAC OT "6 Clicks" Daily Activity     Outcome Measure   Help from another person eating meals?: A Little Help from another person taking care of personal grooming?: A Little Help from another person toileting, which includes using toliet, bedpan, or urinal?: A Lot Help from another person bathing (including washing, rinsing, drying)?: A Lot Help from another person to put on and taking off regular upper body clothing?: A Little Help from another person to put on and taking off regular lower body clothing?: A Lot 6 Click Score: 15    End of Session Equipment Utilized During Treatment: Rolling walker (2 wheels);Gait belt  OT Visit Diagnosis: Unsteadiness on feet (R26.81);Repeated falls (R29.6);Muscle weakness (generalized) (M62.81)   Activity Tolerance Patient tolerated treatment well   Patient  Left in chair;with call bell/phone within reach;with chair alarm set;with family/visitor present   Nurse Communication Mobility status        Time: 5056-9794 OT Time Calculation (min): 33 min  Charges: OT General Charges $OT Visit: 1 Visit OT Treatments $Self Care/Home Management : 23-37 mins  Leta Speller, MS, OTR/L   Darleene Cleaver 07/13/2021, 1:00 PM

## 2021-07-13 NOTE — Care Management Important Message (Signed)
Important Message  Patient Details  Name: Wesley Anderson. MRN: [id number] Date of Birth: Dec 17, 1945   Medicare Important Message Given:  Other (see comment)  Patient is in an isolation room so I called his room [phone]) to review the Important Message from Medicare but there was no answer at this time. Will try again.   Juliann Pulse A Ridhi Hoffert 07/13/2021, 9:53 AM

## 2021-07-14 ENCOUNTER — Encounter: Payer: Medicare HMO | Admitting: Physical Therapy

## 2021-07-14 DIAGNOSIS — R410 Disorientation, unspecified: Secondary | ICD-10-CM | POA: Diagnosis not present

## 2021-07-14 DIAGNOSIS — J101 Influenza due to other identified influenza virus with other respiratory manifestations: Principal | ICD-10-CM

## 2021-07-14 DIAGNOSIS — R531 Weakness: Secondary | ICD-10-CM | POA: Diagnosis not present

## 2021-07-14 LAB — BASIC METABOLIC PANEL
Anion gap: 4 — ABNORMAL LOW (ref 5–15)
BUN: 17 mg/dL (ref 8–23)
CO2: 26 mmol/L (ref 22–32)
Calcium: 8.9 mg/dL (ref 8.9–10.3)
Chloride: 106 mmol/L (ref 98–111)
Creatinine, Ser: 1.11 mg/dL (ref 0.61–1.24)
GFR, Estimated: >60 mL/min (ref >60)
Glucose, Bld: 95 mg/dL (ref 70–99)
Potassium: 3.5 mmol/L (ref 3.5–5.1)
Sodium: 136 mmol/L (ref 135–145)

## 2021-07-14 LAB — CBC
HCT: 38.3 % — ABNORMAL LOW (ref 39.0–52.0)
Hemoglobin: 13.3 g/dL (ref 13.0–17.0)
MCH: 30.6 pg (ref 26.0–34.0)
MCHC: 34.7 g/dL (ref 30.0–36.0)
MCV: 88.2 fL (ref 80.0–100.0)
Platelets: 207 10*3/uL (ref 150–400)
RBC: 4.34 MIL/uL (ref 4.22–5.81)
RDW: 13.3 % (ref 11.5–15.5)
WBC: 6.2 10*3/uL (ref 4.0–10.5)
nRBC: 0 % (ref 0.0–0.2)

## 2021-07-14 MED ORDER — OSELTAMIVIR PHOSPHATE 75 MG PO CAPS
75.0000 mg | ORAL_CAPSULE | Freq: Two times a day (BID) | ORAL | 0 refills | Status: AC
Start: 2021-07-14 — End: 2021-07-15

## 2021-07-14 NOTE — Discharge Summary (Signed)
Physician Discharge Summary   Patient name: Wesley Anderson.  Admit date:     07/09/2021  Discharge date: 07/14/2021  Discharge Physician: Max Sane   PCP: Adin Hector, MD   Recommendations at discharge: Follow-up with outpatient providers as requested  Discharge Diagnoses Principal Problem:   Weakness Active Problems:   Parkinson's disease ([place])   History of malignant melanoma of skin   Heme positive stool   Anemia   Fever and chills   Myalgia   Influenza A   Confusion   Hospital Course    75 y.o. male w/k/h/o skin cancer, abdominal aortic aneurysm, GERD, melanoma, Parkinson's disease admitted for weakness fevers chills lethargy for the past few days-Monday the 21st.  Reports chest congestion and has been lethargic with fevers. Pt has fallen due to his weakness but has not hurt himself or hit his head.    ED Course: In the emergency room patient has hypotensive, dehydrated, currently receiving fluids.   Hospital course from Dr. Jimmye Norman 11/26-11/29/22: Pt was found to have influenza A and was started on influenza. Also, pt was found to be encephalopathic likely secondary to delirium vs Parkinson's dementia. Blood cxs NGTD. Urine cx shows containment. Mental status is now back to baseline. PT/OT recs SNF but pt and pt's wife are refusing SNF but agree to home health. Home health has been set up by CM.      Assessment & Plan:     Influenza A: Completed course of Tamiflu   Back rash: Seems to have improved.  Can use Benadryl prn. May need to see a dermatologist as an outpatient    Normocytic anemia: Stable   Parkinson's disease: continue on sinemet, seroquel & aricept    Acute metabolic encephalopathy: Multifactorial, infection vs delirium vs Parkinson's dementia. Blood cxs NGTD. Urine cxs shows likely containment. Mental status is back to baseline     Malignant melanoma of the skin: management per onco as an outpatient     Bradycardia: continue on tele.  Not on any CCB or BB    Falls/generalized weakness: Multifactorial -likely from progressive Parkinson disease PT/OT recs SNF. Pt and pt's wife are refusing SNF but agree to home health    Thrombocytopenia: resolved     Condition at discharge: fair  Exam Physical Exam   General exam: Appears comfortable Respiratory system: decreased breath sounds otherwise clear  Cardiovascular system: S1/S2+. No rubs or clicks  Gastrointestinal system: Abd is soft, NT, ND & normal bowel sounds  Central nervous system: Alert and oriented. Moves all extremities, baseline tremors from Parkinson's disease Psychiatry: judgement and insight have improved. Appropriate mood and affect   Disposition: Home with home health and palliative care to follow  Discharge time: greater than 30 minutes.  Follow-up Information     Adin Hector, MD. Go on 07/20/2021.   Specialty: Internal Medicine Why: @1 :45pm Contact information: [place] [id number] [phone]                 Allergies as of 07/14/2021       Reactions   Artane [trihexyphenidyl] Anxiety   Propofol Other (See Comments)   Delirium Delirium Delirium Delirium        Medication List     STOP taking these medications    rOPINIRole 8 MG 24 hr tablet Commonly known as: REQUIP XL       TAKE these medications    bacitracin 500 UNIT/GM ointment Apply  1 application topically 2 (two) times daily.   carbidopa-levodopa 25-100 MG tablet Commonly known as: SINEMET IR Take 1.5 tablets by mouth 3 (three) times daily. What changed: how much to take   donepezil 10 MG tablet Commonly known as: ARICEPT Take 10 mg by mouth daily.   Elderberry Zinc/Vit C/Immune Lozg Use as directed 1 lozenge in the mouth or throat daily.   multivitamin tablet Take 1 tablet by mouth daily.   oseltamivir 75 MG capsule Commonly known as: TAMIFLU Take 1 capsule (75 mg total) by mouth 2 (two) times  daily for 1 day.   pantoprazole 40 MG tablet Commonly known as: PROTONIX Take 40 mg by mouth daily.   QUEtiapine 25 MG tablet Commonly known as: SEROQUEL Take 1 tablet (25 mg total) by mouth at bedtime.   sertraline 50 MG tablet Commonly known as: ZOLOFT Take 50 mg by mouth daily.   sildenafil 100 MG tablet Commonly known as: VIAGRA Take 100 mg by mouth daily as needed for erectile dysfunction.   tamsulosin 0.4 MG Caps capsule Commonly known as: FLOMAX Take 0.4 mg by mouth daily.   urea 40 % Crea Commonly known as: CARMOL Apply topically daily.               Durable Medical Equipment  (From admission, onward)           Start     Ordered   07/13/21 1336  For home use only DME Other see comment  Once       Comments: Over the bed table  Question:  Length of Need  Answer:  Lifetime   07/13/21 1335   07/13/21 1131  For home use only DME 3 n 1  Once        07/13/21 1130            DG Chest 2 View  Result Date: 07/09/2021 CLINICAL DATA:  Fever and cough EXAM: CHEST - 2 VIEW COMPARISON:  07/09/2020 FINDINGS: The heart size and mediastinal contours are within normal limits. Both lungs are clear. The visualized skeletal structures are unremarkable. Electrical generator pack in the right chest with lead extending into the neck. This is consistent with a deep brain stimulator as noted on CT. IMPRESSION: No active cardiopulmonary disease. Electronically Signed   By: Franchot Gallo M.D.   On: 07/09/2021 14:00   Results for orders placed or performed during the hospital encounter of 07/09/21  Resp Panel by RT-PCR (Flu A&B, Covid) Nasopharyngeal Swab     Status: Abnormal   Collection Time: 07/09/21  1:37 PM   Specimen: Nasopharyngeal Swab; Nasopharyngeal(NP) swabs in vial transport medium  Result Value Ref Range Status   SARS Coronavirus 2 by RT PCR NEGATIVE NEGATIVE Final    Comment: (NOTE) SARS-CoV-2 target nucleic acids are NOT DETECTED.  The SARS-CoV-2 RNA is  generally detectable in upper respiratory specimens during the acute phase of infection. The lowest concentration of SARS-CoV-2 viral copies this assay can detect is 138 copies/mL. A negative result does not preclude SARS-Cov-2 infection and should not be used as the sole basis for treatment or other patient management decisions. A negative result may occur with  improper specimen collection/handling, submission of specimen other than nasopharyngeal swab, presence of viral mutation(s) within the areas targeted by this assay, and inadequate number of viral copies(<138 copies/mL). A negative result must be combined with clinical observations, patient history, and epidemiological information. The expected result is Negative.  Fact Sheet for Patients:  EntrepreneurPulse.com.au  Fact Sheet for Healthcare Providers:  IncredibleEmployment.be  This test is no t yet approved or cleared by the [place] FDA and  has been authorized for detection and/or diagnosis of SARS-CoV-2 by FDA under an Emergency Use Authorization (EUA). This EUA will remain  in effect (meaning this test can be used) for the duration of the COVID-19 declaration under Section 564(b)(1) of the Act, 21 U.S.C.section 360bbb-3(b)(1), unless the authorization is terminated  or revoked sooner.       Influenza A by PCR POSITIVE (A) NEGATIVE Final   Influenza B by PCR NEGATIVE NEGATIVE Final    Comment: (NOTE) The Xpert Xpress SARS-CoV-2/FLU/RSV plus assay is intended as an aid in the diagnosis of influenza from Nasopharyngeal swab specimens and should not be used as a sole basis for treatment. Nasal washings and aspirates are unacceptable for Xpert Xpress SARS-CoV-2/FLU/RSV testing.  Fact Sheet for Patients: EntrepreneurPulse.com.au  Fact Sheet for Healthcare Providers: IncredibleEmployment.be  This test is not yet approved or cleared by the  [place] FDA and has been authorized for detection and/or diagnosis of SARS-CoV-2 by FDA under an Emergency Use Authorization (EUA). This EUA will remain in effect (meaning this test can be used) for the duration of the COVID-19 declaration under Section 564(b)(1) of the Act, 21 U.S.C. section 360bbb-3(b)(1), unless the authorization is terminated or revoked.  Performed at [facility], [address] [place], [place] [address]   Urine Culture     Status: Abnormal   Collection Time: 07/09/21  7:28 PM   Specimen: Urine, Clean Catch  Result Value Ref Range Status   Specimen Description   Final    URINE, CLEAN CATCH Performed at [facility], [address] [place], [place] [address]    Special Requests   Final    NONE Performed at [facility], [address] [place], [place] [address]    Culture MULTIPLE SPECIES PRESENT, SUGGEST RECOLLECTION (A)  Final   Report Status 07/12/2021 FINAL  Final  CULTURE, BLOOD (ROUTINE X 2) w Reflex to ID Panel     Status: None (Preliminary result)   Collection Time: 07/10/21  1:01 PM   Specimen: BLOOD  Result Value Ref Range Status   Specimen Description BLOOD BLOOD LEFT WRIST  Final   Special Requests   Final    BOTTLES DRAWN AEROBIC AND ANAEROBIC Blood Culture results may not be optimal due to an excessive volume of blood received in culture bottles   Culture   Final    NO GROWTH 4 DAYS Performed at [facility], [address], [place], [place] [address]    Report Status PENDING  Incomplete  CULTURE, BLOOD (ROUTINE X 2) w Reflex to ID Panel     Status: None (Preliminary result)   Collection Time: 07/10/21  1:01 PM   Specimen: BLOOD  Result Value Ref Range Status   Specimen Description BLOOD BLOOD RIGHT WRIST  Final   Special Requests   Final    BOTTLES DRAWN AEROBIC AND ANAEROBIC Blood Culture adequate volume   Culture   Final    NO GROWTH 4 DAYS Performed at [facility], [address] [place], [place] [address]    Report Status PENDING  Incomplete    Signed:  Max Sane MD.  [facility] 07/14/2021, 3:48 PM

## 2021-07-14 NOTE — Care Management Important Message (Signed)
Important Message  Patient Details  Name: Wesley Anderson. MRN: [id number] Date of Birth: [birth year]   Medicare Important Message Given:  Yes     Juliann Pulse A Parissa Chiao 07/14/2021, 9:58 AM

## 2021-07-14 NOTE — TOC Progression Note (Signed)
Transition of Care (TOC) - Progression Note    Patient Details  Name: Pankaj Haack. MRN: [id number] Date of Birth: 05-13-1946  Transition of Care [facility]) CM/SW Contact  Pete Pelt, RN Phone Number: 07/14/2021, 9:03 AM  Clinical Narrative:  Spoke to spouse, she is aware patient is discharging today.  She states someone will be home 12pm and after  EMS will transport patient 12 pm.      Expected Discharge Plan: [place] Barriers to Discharge: Continued Medical Work up  Expected Discharge Plan and Services Expected Discharge Plan: [place]   Discharge Planning Services: CM Consult Post Acute Care Choice: [place] arrangements for the past 2 months: Single Family Home Expected Discharge Date: 07/14/21               DME Arranged: N/A DME Agency: NA       HH Arranged: RN, PT, OT, Nurse's Aide [place] Agency: [place] Date [place] Agency Contacted: 07/12/21 Time [place]: 1100 Representative spoke with at [place]: Mali Ortman [phone] or [phone]   Social Determinants of Health (SDOH) Interventions    Readmission Risk Interventions No flowsheet data found.

## 2021-07-14 NOTE — Progress Notes (Signed)
Patient discharging home. IV removed. Called wife and went over discharge instructions with her. She stated that she understood. Patient being transported home via EMS.

## 2021-07-14 NOTE — Progress Notes (Signed)
Patient c/o back itching. Assessed back and patient had a red spotted rash all over his back. Made MD aware, new order for Benadryl PRN.

## 2021-07-15 LAB — CULTURE, BLOOD (ROUTINE X 2)
Culture: NO GROWTH
Culture: NO GROWTH
Special Requests: ADEQUATE

## 2021-07-16 ENCOUNTER — Telehealth: Payer: Self-pay | Admitting: Student

## 2021-07-16 NOTE — Telephone Encounter (Signed)
Called patient's home number and spoke withw wife Wesley Anderson briefly about the Palliative referral and services and she told me that they were just going out the door to their granddaughter's birthday party and said that I could call her back on Mon.  Will f/u with wife at that time.

## 2021-07-19 ENCOUNTER — Telehealth: Payer: Self-pay | Admitting: Student

## 2021-07-19 ENCOUNTER — Encounter: Payer: Medicare HMO | Admitting: Physical Therapy

## 2021-07-19 NOTE — Telephone Encounter (Signed)
Ret'd call back to wife as requested, to offer to schedule Palliative Consult, no answer - left message requesting a return call.

## 2021-07-21 ENCOUNTER — Encounter: Payer: Medicare HMO | Admitting: Physical Therapy

## 2021-07-26 ENCOUNTER — Encounter: Payer: Medicare HMO | Admitting: Physical Therapy

## 2021-07-28 ENCOUNTER — Encounter: Payer: Medicare HMO | Admitting: Physical Therapy

## 2021-07-28 ENCOUNTER — Telehealth: Payer: Self-pay | Admitting: Student

## 2021-07-28 NOTE — Telephone Encounter (Signed)
Spoke with wife and explained Palliative services to her and all questions were answered and she has declined Palliative services at this time.  Told wife if they needed our services in the future to please reach out to pateint's PCP and then they would send [place] another referral and she was in agreement with this.

## 2021-08-08 ENCOUNTER — Emergency Department: Payer: Medicare HMO

## 2021-08-08 ENCOUNTER — Inpatient Hospital Stay
Admission: EM | Admit: 2021-08-08 | Discharge: 2021-08-11 | DRG: 872 | Disposition: A | Discharge status: Home Health | Payer: Medicare HMO | Attending: Internal Medicine | Admitting: Internal Medicine

## 2021-08-08 ENCOUNTER — Other Ambulatory Visit: Payer: Self-pay

## 2021-08-08 DIAGNOSIS — K529 Noninfective gastroenteritis and colitis, unspecified: Secondary | ICD-10-CM | POA: Diagnosis present

## 2021-08-08 DIAGNOSIS — Z87891 Personal history of nicotine dependence: Secondary | ICD-10-CM | POA: Diagnosis not present

## 2021-08-08 DIAGNOSIS — N170 Acute kidney failure with tubular necrosis: Secondary | ICD-10-CM | POA: Diagnosis not present

## 2021-08-08 DIAGNOSIS — N179 Acute kidney failure, unspecified: Secondary | ICD-10-CM | POA: Diagnosis present

## 2021-08-08 DIAGNOSIS — A419 Sepsis, unspecified organism: Secondary | ICD-10-CM | POA: Diagnosis present

## 2021-08-08 DIAGNOSIS — F1021 Alcohol dependence, in remission: Secondary | ICD-10-CM | POA: Diagnosis present

## 2021-08-08 DIAGNOSIS — Z9689 Presence of other specified functional implants: Secondary | ICD-10-CM

## 2021-08-08 DIAGNOSIS — I714 Abdominal aortic aneurysm, without rupture, unspecified: Secondary | ICD-10-CM | POA: Diagnosis present

## 2021-08-08 DIAGNOSIS — F0280 Dementia in other diseases classified elsewhere without behavioral disturbance: Secondary | ICD-10-CM | POA: Diagnosis present

## 2021-08-08 DIAGNOSIS — Z9049 Acquired absence of other specified parts of digestive tract: Secondary | ICD-10-CM

## 2021-08-08 DIAGNOSIS — G9340 Encephalopathy, unspecified: Secondary | ICD-10-CM | POA: Diagnosis present

## 2021-08-08 DIAGNOSIS — R6520 Severe sepsis without septic shock: Secondary | ICD-10-CM | POA: Diagnosis not present

## 2021-08-08 DIAGNOSIS — K5790 Diverticulosis of intestine, part unspecified, without perforation or abscess without bleeding: Secondary | ICD-10-CM | POA: Diagnosis present

## 2021-08-08 DIAGNOSIS — G20 Parkinson's disease: Secondary | ICD-10-CM | POA: Diagnosis present

## 2021-08-08 DIAGNOSIS — Z888 Allergy status to other drugs, medicaments and biological substances status: Secondary | ICD-10-CM

## 2021-08-08 DIAGNOSIS — Z85828 Personal history of other malignant neoplasm of skin: Secondary | ICD-10-CM

## 2021-08-08 DIAGNOSIS — R4182 Altered mental status, unspecified: Secondary | ICD-10-CM

## 2021-08-08 DIAGNOSIS — Z79899 Other long term (current) drug therapy: Secondary | ICD-10-CM

## 2021-08-08 DIAGNOSIS — R509 Fever, unspecified: Secondary | ICD-10-CM

## 2021-08-08 DIAGNOSIS — Z20822 Contact with and (suspected) exposure to covid-19: Secondary | ICD-10-CM | POA: Diagnosis present

## 2021-08-08 DIAGNOSIS — E876 Hypokalemia: Secondary | ICD-10-CM | POA: Diagnosis present

## 2021-08-08 DIAGNOSIS — R197 Diarrhea, unspecified: Secondary | ICD-10-CM

## 2021-08-08 LAB — LACTIC ACID, PLASMA
Lactic Acid, Venous: 2.8 mmol/L (ref 0.5–1.9)
Lactic Acid, Venous: 1.4 mmol/L (ref 0.5–1.9)

## 2021-08-08 LAB — COMPREHENSIVE METABOLIC PANEL
ALT: 24 U/L (ref 0–44)
AST: 26 U/L (ref 15–41)
Albumin: 4 g/dL (ref 3.5–5.0)
Alkaline Phosphatase: 87 U/L (ref 38–126)
Anion gap: 10 (ref 5–15)
BUN: 33 mg/dL — ABNORMAL HIGH (ref 8–23)
CO2: 21 mmol/L — ABNORMAL LOW (ref 22–32)
Calcium: 10 mg/dL (ref 8.9–10.3)
Chloride: 107 mmol/L (ref 98–111)
Creatinine, Ser: 1.52 mg/dL — ABNORMAL HIGH (ref 0.61–1.24)
GFR, Estimated: 48 mL/min — ABNORMAL LOW (ref >60)
Glucose, Bld: 130 mg/dL — ABNORMAL HIGH (ref 70–99)
Potassium: 3.7 mmol/L (ref 3.5–5.1)
Sodium: 138 mmol/L (ref 135–145)
Total Bilirubin: 2.4 mg/dL — ABNORMAL HIGH (ref 0.3–1.2)
Total Protein: 8.2 g/dL — ABNORMAL HIGH (ref 6.5–8.1)

## 2021-08-08 LAB — GASTROINTESTINAL PANEL BY PCR, STOOL (REPLACES STOOL CULTURE)

## 2021-08-08 LAB — CBC WITH DIFFERENTIAL/PLATELET
Abs Immature Granulocytes: 0.05 10*3/uL (ref 0.00–0.07)
Basophils Absolute: 0.1 10*3/uL (ref 0.0–0.1)
Basophils Relative: 0 %
Eosinophils Absolute: 0.1 10*3/uL (ref 0.0–0.5)
Eosinophils Relative: 0 %
HCT: 43.1 % (ref 39.0–52.0)
Hemoglobin: 14.4 g/dL (ref 13.0–17.0)
Immature Granulocytes: 0 %
Lymphocytes Relative: 5 %
Lymphs Abs: 1 10*3/uL (ref 0.7–4.0)
MCH: 30.5 pg (ref 26.0–34.0)
MCHC: 33.4 g/dL (ref 30.0–36.0)
MCV: 91.3 fL (ref 80.0–100.0)
Monocytes Absolute: 2.1 10*3/uL — ABNORMAL HIGH (ref 0.1–1.0)
Monocytes Relative: 11 %
Neutro Abs: 17 10*3/uL — ABNORMAL HIGH (ref 1.7–7.7)
Neutrophils Relative %: 84 %
Platelets: 200 10*3/uL (ref 150–400)
RBC: 4.72 MIL/uL (ref 4.22–5.81)
RDW: 14.1 % (ref 11.5–15.5)
WBC: 20.4 10*3/uL — ABNORMAL HIGH (ref 4.0–10.5)
nRBC: 0 % (ref 0.0–0.2)

## 2021-08-08 LAB — C DIFFICILE QUICK SCREEN W PCR REFLEX
C Diff antigen: NEGATIVE
C Diff interpretation: NOT DETECTED
C Diff toxin: NEGATIVE

## 2021-08-08 LAB — LIPASE, BLOOD: Lipase: 35 U/L (ref 11–51)

## 2021-08-08 LAB — CORTISOL-AM, BLOOD: Cortisol - AM: 24.4 ug/dL — ABNORMAL HIGH (ref 6.7–22.6)

## 2021-08-08 LAB — CBC
HCT: 35.4 % — ABNORMAL LOW (ref 39.0–52.0)
Hemoglobin: 11.9 g/dL — ABNORMAL LOW (ref 13.0–17.0)
MCH: 30.1 pg (ref 26.0–34.0)
MCHC: 33.6 g/dL (ref 30.0–36.0)
MCV: 89.6 fL (ref 80.0–100.0)
Platelets: 172 10*3/uL (ref 150–400)
RBC: 3.95 MIL/uL — ABNORMAL LOW (ref 4.22–5.81)
RDW: 14.3 % (ref 11.5–15.5)
WBC: 14.1 10*3/uL — ABNORMAL HIGH (ref 4.0–10.5)
nRBC: 0 % (ref 0.0–0.2)

## 2021-08-08 LAB — BASIC METABOLIC PANEL
Anion gap: 5 (ref 5–15)
BUN: 29 mg/dL — ABNORMAL HIGH (ref 8–23)
CO2: 22 mmol/L (ref 22–32)
Calcium: 8.6 mg/dL — ABNORMAL LOW (ref 8.9–10.3)
Chloride: 113 mmol/L — ABNORMAL HIGH (ref 98–111)
Creatinine, Ser: 1.29 mg/dL — ABNORMAL HIGH (ref 0.61–1.24)
GFR, Estimated: 59 mL/min — ABNORMAL LOW (ref >60)
Glucose, Bld: 119 mg/dL — ABNORMAL HIGH (ref 70–99)
Potassium: 3.8 mmol/L (ref 3.5–5.1)
Sodium: 140 mmol/L (ref 135–145)

## 2021-08-08 LAB — PROTIME-INR
INR: 1.3 — ABNORMAL HIGH (ref 0.8–1.2)
Prothrombin Time: 16.6 seconds — ABNORMAL HIGH (ref 11.4–15.2)

## 2021-08-08 LAB — RESP PANEL BY RT-PCR (FLU A&B, COVID) ARPGX2
Influenza A by PCR: NEGATIVE
Influenza B by PCR: NEGATIVE
SARS Coronavirus 2 by RT PCR: NEGATIVE

## 2021-08-08 LAB — TROPONIN I (HIGH SENSITIVITY)
Troponin I (High Sensitivity): 11 ng/L (ref <18)
Troponin I (High Sensitivity): 12 ng/L (ref <18)

## 2021-08-08 LAB — PROCALCITONIN: Procalcitonin: 4.68 ng/mL

## 2021-08-08 MED ORDER — SODIUM CHLORIDE 0.9 % IV SOLN: INTRAVENOUS | Status: DC

## 2021-08-08 MED ORDER — VANCOMYCIN HCL IN DEXTROSE 1-5 GM/200ML-% IV SOLN: 1000.0000 mg | Freq: Once | INTRAVENOUS | Status: DC

## 2021-08-08 MED ORDER — ENOXAPARIN SODIUM 40 MG/0.4ML IJ SOSY
40.0000 mg | PREFILLED_SYRINGE | INTRAMUSCULAR | Status: DC
  Administered 2021-08-08 – 2021-08-11 (×4): 40 mg via SUBCUTANEOUS
  Filled 2021-08-08 (×4): qty 0.4

## 2021-08-08 MED ORDER — METRONIDAZOLE 500 MG/100ML IV SOLN: 500.0000 mg | Freq: Once | INTRAVENOUS | Status: DC

## 2021-08-08 MED ORDER — ACETAMINOPHEN 325 MG PO TABS: 650.0000 mg | ORAL_TABLET | Freq: Four times a day (QID) | ORAL | Status: DC | PRN

## 2021-08-08 MED ORDER — SODIUM CHLORIDE 0.9 % IV BOLUS (SEPSIS)
500.0000 mL | Freq: Once | INTRAVENOUS | Status: AC
  Administered 2021-08-08: 06:00:00 500 mL via INTRAVENOUS

## 2021-08-08 MED ORDER — SODIUM CHLORIDE 0.9 % IV BOLUS (SEPSIS)
1000.0000 mL | Freq: Once | INTRAVENOUS | Status: AC
  Administered 2021-08-08: 03:00:00 1000 mL via INTRAVENOUS

## 2021-08-08 MED ORDER — ONDANSETRON HCL 4 MG PO TABS: 4.0000 mg | ORAL_TABLET | Freq: Four times a day (QID) | ORAL | Status: DC | PRN

## 2021-08-08 MED ORDER — ONDANSETRON HCL 4 MG/2ML IJ SOLN
4.0000 mg | Freq: Four times a day (QID) | INTRAMUSCULAR | Status: DC | PRN
  Administered 2021-08-08: 20:00:00 4 mg via INTRAVENOUS
  Filled 2021-08-08: qty 2

## 2021-08-08 MED ORDER — VANCOMYCIN HCL 1750 MG/350ML IV SOLN
1750.0000 mg | Freq: Once | INTRAVENOUS | Status: DC
  Filled 2021-08-08: qty 350

## 2021-08-08 MED ORDER — SODIUM CHLORIDE 0.9 % IV BOLUS
1000.0000 mL | Freq: Once | INTRAVENOUS | Status: AC
  Administered 2021-08-08: 01:00:00 1000 mL via INTRAVENOUS

## 2021-08-08 MED ORDER — SODIUM CHLORIDE 0.9 % IV SOLN: 2.0000 g | Freq: Once | INTRAVENOUS | Status: DC

## 2021-08-08 MED ORDER — CARBIDOPA-LEVODOPA 25-100 MG PO TABS
2.0000 | ORAL_TABLET | Freq: Three times a day (TID) | ORAL | Status: DC
  Administered 2021-08-08 – 2021-08-11 (×10): 2 via ORAL
  Filled 2021-08-08 (×10): qty 2

## 2021-08-08 MED ORDER — ACETAMINOPHEN 650 MG RE SUPP: 650.0000 mg | Freq: Four times a day (QID) | RECTAL | Status: DC | PRN

## 2021-08-08 MED ORDER — SODIUM CHLORIDE 0.9 % IV BOLUS (SEPSIS)
1000.0000 mL | Freq: Once | INTRAVENOUS | Status: DC
  Administered 2021-08-08: 03:00:00 1000 mL via INTRAVENOUS

## 2021-08-08 MED ORDER — PANTOPRAZOLE SODIUM 40 MG IV SOLR
40.0000 mg | INTRAVENOUS | Status: DC
  Administered 2021-08-08 – 2021-08-09 (×2): 40 mg via INTRAVENOUS
  Filled 2021-08-08 (×2): qty 40

## 2021-08-08 MED ORDER — ACETAMINOPHEN 325 MG RE SUPP
975.0000 mg | Freq: Once | RECTAL | Status: AC
  Administered 2021-08-08: 05:00:00 975 mg via RECTAL
  Filled 2021-08-08: qty 3

## 2021-08-08 MED ORDER — PIPERACILLIN-TAZOBACTAM 3.375 G IVPB
3.3750 g | Freq: Three times a day (TID) | INTRAVENOUS | Status: DC
  Administered 2021-08-08 – 2021-08-09 (×5): 3.375 g via INTRAVENOUS
  Filled 2021-08-08 (×5): qty 50

## 2021-08-08 NOTE — Sepsis Progress Note (Signed)
[place] following Code sepsis  

## 2021-08-08 NOTE — Progress Notes (Signed)
Pharmacy Antibiotic Note  Wesley Bonn. is a 75 y.o. male admitted on 08/08/2021 with intra-abodminal.  Pharmacy has been consulted for Zosyn dosing.  Plan: Zosyn 3.375g IV q8h (4 hour infusion).  Height: 5\' 9"  (175.3 cm) Weight: 79.4 kg (175 lb) IBW/kg (Calculated) : 70.7  Temp (24hrs), Avg:99.4 F (37.4 C), Min:98.2 F (36.8 C), Max:100.6 F (38.1 C)  Recent Labs  Lab 08/08/21 0100 08/08/21 0105  WBC  --  20.4*  CREATININE  --  1.52*  LATICACIDVEN 2.8*  --     Estimated Creatinine Clearance: 43.3 mL/min (A) (by C-G formula based on SCr of 1.52 mg/dL (H)).    Allergies  Allergen Reactions   Artane [Trihexyphenidyl] Anxiety   Propofol Other (See Comments)    Delirium Delirium Delirium Delirium     Antimicrobials this admission:   >>    >>   Dose adjustments this admission:   Microbiology results:  BCx:   UCx:    Sputum:    MRSA PCR:   Thank you for allowing pharmacy to be a part of this patients care.  Wesley Anderson 08/08/2021 3:18 AM

## 2021-08-08 NOTE — ED Notes (Addendum)
Assisted Melissa, RN with performing an I&O catherization on this patient. After multiple attempts to gently insert the catheter, resistance was met each time. Unable to perform catherization without the catheter curling up inside the urethra and causing patient a great amount of pain. Dr. Beather Arbour notified and made aware. Also, aware of rectal temp

## 2021-08-08 NOTE — ED Triage Notes (Addendum)
Pt arrived via EMS from home for nausea and vomiting that started today. No diarrhea reported. Pt reports had BM this afternoon and was normal. Pt has been eating and drinking less since yesterday. Pt denies abdominal pain. Pt had flu two weeks ago but had mainly upper respiratory symptoms. Pt denies being around anyone known sick lately. Hx: Parkinson's disease x 20 years. Pt denies running fever recently.

## 2021-08-08 NOTE — ED Notes (Signed)
Dr. Duncan at the bedside  

## 2021-08-08 NOTE — Progress Notes (Signed)
PHARMACY -  BRIEF ANTIBIOTIC NOTE   Pharmacy has received consult(s) for Vanc, Cefepime  from an ED provider.  The patient's profile has been reviewed for ht/wt/allergies/indication/available labs.    One time order(s) placed for Vancomycin 1750 mg IV X 1 and Cefepime 2 gm IV X 1   Further antibiotics/pharmacy consults should be ordered by admitting physician if indicated.                       Thank you, Eliah Marquard D 08/08/2021  2:27 AM

## 2021-08-08 NOTE — ED Provider Notes (Signed)
[facility] Emergency Department Provider Note   ____________________________________________   Event Date/Time   First MD Initiated Contact with Patient 08/08/21 0017     (approximate)  I have reviewed the triage vital signs and the nursing notes.   HISTORY  Chief Complaint N/V/D, AMS  Level V caveat: Limited by dementia  HPI Wesley Anderson. is a 75 y.o. male brought to the ED via EMS from home with a chief complaint of nausea, vomiting, diarrhea and altered mentation.  Family reports symptoms x1 day, unable to take his home medications.  Patient denies chest pain, shortness of breath, abdominal pain.  Rest of history is limited secondary to dementia.     Past Medical History:  Diagnosis Date   AAA (abdominal aortic aneurysm)    Alcohol dependence in remission ([place])    Cancer ([place])    skin    Colon polyps    ED (erectile dysfunction)    GERD (gastroesophageal reflux disease)    Hematest positive stools    Melanoma of skin (H[facility])    Parkinson's disease ([place])    Renal cyst, right    Restless leg syndrome     Patient Active Problem List   Diagnosis Date Noted   Acute gastroenteritis 08/08/2021   Sepsis ([place]) 08/08/2021   Confusion    Anemia 07/09/2021   Weakness 07/09/2021   Fever and chills 07/09/2021   Myalgia 07/09/2021   Influenza A 60/73/7106   Acute metabolic encephalopathy 26/94/8546   AKI (acute kidney injury) ([place]) 07/09/2020   Leukocytosis 07/09/2020   S/P deep brain stimulator placement 07/09/2020   Squamous cell carcinoma, 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 04/24/2020   Heme positive stool 05/27/2016   Alcohol dependence in remission ([place]) 07/31/2013   Cognitive impairment 02/22/2013   History of malignant melanoma of skin 05/15/2012   Parkinson's disease ([place]) 09/28/2011    Past Surgical History:  Procedure Laterality Date   CHOLECYSTECTOMY     COLON SURGERY     colectomy with anastamosis   COLONOSCOPY WITH  PROPOFOL N/A 11/07/2016   Procedure: COLONOSCOPY WITH PROPOFOL;  Surgeon: Lollie Sails, MD;  Location: [facility] ENDOSCOPY;  Service: Endoscopy;  Laterality: N/A;   COLONOSCOPY WITH PROPOFOL N/A 09/06/2018   Procedure: COLONOSCOPY WITH PROPOFOL;  Surgeon: Lollie Sails, MD;  Location[facility] ENDOSCOPY;  Service: Endoscopy;  Laterality: N/A;   COLONOSCOPY WITH PROPOFOL N/A 05/16/2019   Procedure: COLONOSCOPY WITH PROPOFOL;  Surgeon: Lollie Sails, MD;  Location: [facility] ENDOSCOPY;  Service: Endoscopy;  Laterality: N/A;   CYST EXCISION     vocal cord   DBS placement     deep brain implant     ESOPHAGOGASTRODUODENOSCOPY (EGD) WITH PROPOFOL N/A 09/06/2018   Procedure: ESOPHAGOGASTRODUODENOSCOPY (EGD) WITH PROPOFOL;  Surgeon: Lollie Sails, MD;  Location: [facility] ENDOSCOPY;  Service: Endoscopy;  Laterality: N/A;   MELANOMA EXCISION     TONSILLECTOMY      Prior to Admission medications   Medication Sig Start Date End Date Taking? Authorizing Provider  bacitracin 500 UNIT/GM ointment Apply 1 application topically 2 (two) times daily. 07/05/20   [provider]  carbidopa-levodopa (SINEMET IR) 25-100 MG tablet Take 1.5 tablets by mouth 3 (three) times daily. Patient taking differently: Take 2 tablets by mouth 3 (three) times daily. 07/14/20 07/09/21  Darliss Cheney, MD  donepezil (ARICEPT) 10 MG tablet Take 10 mg by mouth daily. 08/06/20   [provider]  Misc Natural Products (ELDERBERRY ZINC/VIT C/IMMUNE) LOZG Use as  directed 1 lozenge in the mouth or throat daily.    [provider]  Multiple Vitamin (MULTIVITAMIN) tablet Take 1 tablet by mouth daily.    [provider]  pantoprazole (PROTONIX) 40 MG tablet Take 40 mg by mouth daily.    [provider]  QUEtiapine (SEROQUEL) 25 MG tablet Take 1 tablet (25 mg total) by mouth at bedtime. 07/14/20 07/09/21  Darliss Cheney, MD  sertraline (ZOLOFT) 50 MG tablet Take 50 mg by mouth daily. 06/26/21    [provider]  sildenafil (VIAGRA) 100 MG tablet Take 100 mg by mouth daily as needed for erectile dysfunction.    [provider]  tamsulosin (FLOMAX) 0.4 MG CAPS capsule Take 0.4 mg by mouth daily. 07/03/21   [provider]  urea (CARMOL) 40 % CREA Apply topically daily.    [provider]    Allergies Artane [trihexyphenidyl] and Propofol  Family History  Problem Relation Age of Onset   Diabetes Father     Social History Social History   Tobacco Use   Smoking status: Former   Smokeless tobacco: Never  Scientific laboratory technician Use: Never used  Substance Use Topics   Alcohol use: Yes    Alcohol/week: 6.0 standard drinks    Types: 3 Shots of liquor, 3 Standard drinks or equivalent per week   Drug use: Not Currently    Review of Systems  Constitutional: No fever/chills Eyes: No visual changes. ENT: No sore throat. Cardiovascular: Denies chest pain. Respiratory: Denies shortness of breath. Gastrointestinal: No abdominal pain.  Positive for nausea, vomiting and diarrhea.  No constipation. Genitourinary: Negative for dysuria. Musculoskeletal: Negative for back pain. Skin: Negative for rash. Neurological: Negative for headaches, focal weakness or numbness.   ____________________________________________   PHYSICAL EXAM:  VITAL SIGNS: ED Triage Vitals [08/08/21 0018]  Enc Vitals Group     BP 107/71     Pulse Rate (!) 113     Resp 20     Temp 98.2 F (36.8 C)     Temp Source Oral     SpO2 96 %     Weight      Height      Head Circumference      Peak Flow      Pain Score      Pain Loc      Pain Edu?      Excl. in [place]?     Constitutional: Alert and oriented.  Elderly appearing and in mild acute distress. Eyes: Conjunctivae are normal. PERRL. EOMI. Head: Atraumatic. Nose: No congestion/rhinnorhea. Mouth/Throat: Mucous membranes are mildly dry. Neck: No stridor.  Supple neck without meninismus.   Cardiovascular:  Tachycardic rate, regular rhythm. Grossly normal heart sounds.  Good peripheral circulation. Respiratory: Normal respiratory effort.  No retractions. Lungs CTAB. Gastrointestinal: Soft and nontender to light or deep palpation. No distention. No abdominal bruits. No CVA tenderness. Musculoskeletal: No lower extremity tenderness nor edema.  No joint effusions. Neurologic:  Normal speech and language. No gross focal neurologic deficits are appreciated.  Skin:  Skin is hot, dry and intact. No rash noted.  No petechiae. Psychiatric: Mood and affect are normal. Speech and behavior are normal.  ____________________________________________   LABS (all labs ordered are listed, but only abnormal results are displayed)  Labs Reviewed  CBC WITH DIFFERENTIAL/PLATELET - Abnormal; Notable for the following components:      Result Value   WBC 20.4 (*)    Neutro Abs 17.0 (*)  Monocytes Absolute 2.1 (*)    All other components within normal limits  COMPREHENSIVE METABOLIC PANEL - Abnormal; Notable for the following components:   CO2 21 (*)    Glucose, Bld 130 (*)    BUN 33 (*)    Creatinine, Ser 1.52 (*)    Total Protein 8.2 (*)    Total Bilirubin 2.4 (*)    GFR, Estimated 48 (*)    All other components within normal limits  LACTIC ACID, PLASMA - Abnormal; Notable for the following components:   Lactic Acid, Venous 2.8 (*)    All other components within normal limits  RESP PANEL BY RT-PCR (FLU A&B, COVID) ARPGX2  CULTURE, BLOOD (ROUTINE X 2)  CULTURE, BLOOD (ROUTINE X 2)  URINE CULTURE  C DIFFICILE QUICK SCREEN W PCR REFLEX    GASTROINTESTINAL PANEL BY PCR, STOOL (REPLACES STOOL CULTURE)  LACTIC ACID, PLASMA  LIPASE, BLOOD  URINALYSIS, ROUTINE W REFLEX MICROSCOPIC  CORTISOL-AM, BLOOD  PROCALCITONIN  PROTIME-INR  TROPONIN I (HIGH SENSITIVITY)  TROPONIN I (HIGH SENSITIVITY)   ____________________________________________  EKG  ED ECG REPORT I, Nalin Mazzocco J, the attending physician,  personally viewed and interpreted this ECG.   Date: 08/08/2021  EKG Time: 0224  Rate: 81  Rhythm: Accelerated junctional  Axis: Normal  Intervals:none  ST&T Change: Nonspecific  ____________________________________________  RADIOLOGY I, Kaiel Weide J, personally viewed and evaluated these images (plain radiographs) as part of my medical decision making, as well as reviewing the written report by the radiologist.  ED MD interpretation: No acute cardiopulmonary process; no ICH, CT abdomen/pelvis demonstrates diarrheal state with possible mild enteritis  Official radiology report(s): CT Abdomen Pelvis Wo Contrast  Result Date: 08/08/2021 CLINICAL DATA:  Diarrhea, nausea vomiting. EXAM: CT ABDOMEN AND PELVIS WITHOUT CONTRAST TECHNIQUE: Multidetector CT imaging of the abdomen and pelvis was performed following the standard protocol without IV contrast. COMPARISON:  None. FINDINGS: Evaluation of this exam is limited in the absence of intravenous contrast. Lower chest: Minimal bibasilar dependent atelectasis. The visualized lung bases are otherwise clear. There is 3 vessel coronary vascular calcification. No intra-abdominal free air or free fluid. Hepatobiliary: Small left hepatic hypodense lesion, likely a cyst. No intrahepatic biliary dilatation. The cholecystectomy. Pancreas: Unremarkable. No pancreatic ductal dilatation or surrounding inflammatory changes. Spleen: Normal in size without focal abnormality. Adrenals/Urinary Tract: The adrenal glands unremarkable. There is a 1 cm exophytic lesion from the lateral interpolar left kidney which is not characterized. There is a 7 mm nonobstructing right renal inferior pole calculus. There is no hydronephrosis on either side. The visualized ureters appear unremarkable. There is a 2.5 cm stone within the urinary bladder. Stomach/Bowel: There is postsurgical changes of the bowel with anastomotic sutures. Several fluid-filled loops of small bowel in the right  hemiabdomen may be physiologic or represent mild enteritis. No bowel obstruction. Loose stool throughout the colon compatible with diarrheal state. Vascular/Lymphatic: Moderate aortoiliac atherosclerotic disease. The IVC is unremarkable. No portal venous gas. There is no adenopathy. Reproductive: The prostate and seminal vesicles are grossly unremarkable no pelvic masses Other: Small fat containing umbilical hernia. Musculoskeletal: Degenerative changes of the spine. No acute osseous pathology. IMPRESSION: 1. Diarrheal state with possible mild enteritis. No bowel obstruction. 2. A 7 mm nonobstructing right renal inferior pole calculus. No hydronephrosis. 3. A 2.5 cm urinary bladder stone. 4. Aortic Atherosclerosis (ICD10-I70.0). Electronically Signed   By: Anner Crete M.D.   On: 08/08/2021 01:23   CT Head Wo Contrast  Result Date: 08/08/2021 CLINICAL DATA:  Encephalopathy EXAM:  CT HEAD WITHOUT CONTRAST TECHNIQUE: Contiguous axial images were obtained from the base of the skull through the vertex without intravenous contrast. COMPARISON:  None. FINDINGS: Brain: There is no mass, hemorrhage or extra-axial collection. The appearance of the white matter is normal for the patient's age. There is generalized atrophy. The brain stimulator lead terminates in the region of the right subthalamic nucleus. Vascular: No abnormal hyperdensity of the major intracranial arteries or dural venous sinuses. No intracranial atherosclerosis. Skull: The visualized skull base, calvarium and extracranial soft tissues are normal. Sinuses/Orbits: No fluid levels or advanced mucosal thickening of the visualized paranasal sinuses. No mastoid or middle ear effusion. The orbits are normal. IMPRESSION: 1. No acute intracranial abnormality. 2.  Cerebral Atrophy (ICD10-G31.9). Electronically Signed   By: Ulyses Jarred M.D.   On: 08/08/2021 01:40   DG Chest Port 1 View  Result Date: 08/08/2021 CLINICAL DATA:  AMS EXAM: PORTABLE CHEST 1  VIEW COMPARISON:  07/09/2021 FINDINGS: The heart size and mediastinal contours are within normal limits. Both lungs are clear. The visualized skeletal structures are unremarkable. Deep brain stimulator generator implanted in the right chest. IMPRESSION: No active disease. Electronically Signed   By: Ulyses Jarred M.D.   On: 08/08/2021 00:34    ____________________________________________   PROCEDURES  Procedure(s) performed (including Critical Care):  .1-3 Lead EKG Interpretation Performed by: Paulette Blanch, MD Authorized by: Paulette Blanch, MD     Interpretation: abnormal     ECG rate:  113   ECG rate assessment: tachycardic     Rhythm: sinus tachycardia     Ectopy: none     Conduction: normal   Comments:     Patient placed on cardiac monitor to evaluate for arrhythmias   ____________________________________________   INITIAL IMPRESSION / ASSESSMENT AND PLAN / ED COURSE  As part of my medical decision making, I reviewed the following data within the [facility] notes reviewed and incorporated, Labs reviewed, EKG interpreted, Old chart reviewed, and Notes from prior ED visits     74 year old male presenting with altered mentation, nausea, vomiting and diarrhea. Differential diagnosis includes, but is not limited to, alcohol, illicit or prescription medications, or other toxic ingestion; intracranial pathology such as stroke or intracerebral hemorrhage; fever or infectious causes including sepsis; hypoxemia and/or hypercarbia; uremia; trauma; endocrine related disorders such as diabetes, hypoglycemia, and thyroid-related diseases; hypertensive encephalopathy; etc.   Will obtain rectal temperature, code sepsis lab work/UA, CT head, chest x-ray.  Initiate IV fluid resuscitation.  Patient received 4mg  IV Zofran per EMS.  Will reassess.  Clinical Course as of 08/08/21 0510  Nancy Fetter Aug 08, 2021  0207 Elevated lactic acid.  Nursing attempting in/out urine now.  Given  elevated lactic acid coupled with leukocytosis, will initiate ED code sepsis protocol. [JS]    Clinical Course User Index [JS] Paulette Blanch, MD     ____________________________________________   FINAL CLINICAL IMPRESSION(S) / ED DIAGNOSES  Final diagnoses:  Nausea vomiting and diarrhea  Altered mental status, unspecified altered mental status type  Sepsis, due to unspecified organism, unspecified whether acute organ dysfunction present ([place])  Enteritis  AKI (acute kidney injury) ([place])  Fever, unspecified fever cause     ED Discharge Orders     None        Note:  This document was prepared using Dragon voice recognition software and may include unintentional dictation errors.    Paulette Blanch, MD 08/08/21 [phone]

## 2021-08-08 NOTE — ED Notes (Signed)
When moving patient to side to get rectal temp, small area of redness above sacral area noted and mentioned by wife. Changed brief also due to patient soiling his diaper. Urine darker yellow/slight brown in color but per wife, his urine is normally that color due to his medications.

## 2021-08-08 NOTE — H&P (Signed)
History and Physical    Wesley Anderson. DUK:[id number] DOB: 08-Aug-1946 DOA: 08/08/2021  PCP: Adin Hector, MD   Patient coming from: home  I have personally briefly reviewed patient's relevant medical records in [place]  Chief Complaint: vomiting and diarrhea  HPI: Wesley Anderson. is a 75 y.o. male with medical history significant for Parkinson's disease s/p deep brain stimulator placement, chronic physical deconditioning, hospitalized a month ago for influenza A presenting with acute encephalopathy, who was brought in by EMS for evaluation of vomiting and diarrhea.  Wife at the bedside who gives the history states that he was recovering well from his influenza and was at baseline until earlier in the day when he developed repeated episodes of vomiting, nonbloody nonbilious to where he was unable to keep down his Parkinson's meds.  As the day progressed he developed diarrhea with incontinence.  Stool is runny and without blood and not black he denies abdominal pain or fever.  No affected contacts and no recent unfamiliar foods.  He has had no recent cough or shortness of breath.  ED course: T-max 100.6 with pulse 113 and otherwise normal vitals Blood work with WBC 20,000 and lactic acid 2.8 Creatinine 1.52 up from baseline of 0.88 Total bili 2.4 and lipase normal Troponin 11 COVID and flu negative  EKG, personally viewed and interpreted: Sinus at 81 with nonspecific ST-T wave changes  CT abdomen and pelvis with possible mild enteritis Head CT without acute findings Chest x-ray no active disease  Patient started on sepsis fluids and broad-spectrum antibiotics.  Hospitalist consulted for admission.   Review of Systems: As per HPI otherwise all other systems on review of systems negative.   Assessment/Plan    Sepsis ([place])   Acute gastroenteritis - Sepsis fluids - IV Zosyn - GI panel and stool for C. difficile - IV antiemetics, IV Protonix    AKI (acute  kidney injury) ([place]) - Prerenal secondary to fluid losses above and sepsis - IV fluid resuscitation and monitor renal function    Parkinson's disease ([place])   S/P deep brain stimulator placement - Continue Sinemet, Aricept, Seroquel with small sips of water   DVT prophylaxis: Lovenox  Code Status: full code  Family Communication: wife Disposition Plan: Back to previous home environment Consults called: none  Status:At the time of admission, it appears that the appropriate admission status for this patient is INPATIENT. This is judged to be reasonable and necessary in order to provide the required intensity of service to ensure the patient's safety given the presenting symptoms, physical exam findings, and initial radiographic and laboratory data in the context of their  Comorbid conditions.   Patient requires inpatient status due to high intensity of service, high risk for further deterioration and high frequency of surveillance required.   I certify that at the point of admission it is my clinical judgment that the patient will require inpatient hospital care spanning beyond 2 midnights     Physical Exam: Vitals:   08/08/21 0018 08/08/21 0020 08/08/21 0210 08/08/21 0223  BP: 107/71  108/67   Pulse: (!) 113  79   Resp: 20  19   Temp: 98.2 F (36.8 C)  (S) (!) 100.6 F (38.1 C)   TempSrc: Oral  (S) Rectal   SpO2: 96% 96% 96%   Weight:    79.4 kg  Height:    5\' 9"  (1.753 m)   Constitutional: Lethargic, oriented x 2 . Not in any apparent  distress HEENT:      Head: Normocephalic and atraumatic.         Eyes: PERLA, EOMI, Conjunctivae are normal. Sclera is non-icteric.  Ptosis right eye      Mouth/Throat: Mucous membranes are moist.       Neck: Supple with no signs of meningismus. Cardiovascular: Tachycardic. No murmurs, gallops, or rubs. 2+ symmetrical distal pulses are present . No JVD. No  LE edema Respiratory: Respiratory effort normal .Lungs sounds clear bilaterally. No  wheezes, crackles, or rhonchi.  Gastrointestinal: Soft, non tender, non distended. Positive bowel sounds.  Genitourinary: No CVA tenderness. Musculoskeletal: Nontender with normal range of motion in all extremities. No cyanosis, or erythema of extremities. Neurologic:  Face is symmetric. Moving all extremities. No gross focal neurologic deficits .  Resting tremor Skin: Skin is warm, dry.  No rash or ulcers Psychiatric: Mood and affect are appropriate     Past Medical History:  Diagnosis Date   AAA (abdominal aortic aneurysm)    Alcohol dependence in remission ([place])    Cancer ([place])    skin    Colon polyps    ED (erectile dysfunction)    GERD (gastroesophageal reflux disease)    Hematest positive stools    Melanoma of skin ([facility])    Parkinson's disease ([place])    Renal cyst, right    Restless leg syndrome     Past Surgical History:  Procedure Laterality Date   CHOLECYSTECTOMY     COLON SURGERY     colectomy with anastamosis   COLONOSCOPY WITH PROPOFOL N/A 11/07/2016   Procedure: COLONOSCOPY WITH PROPOFOL;  Surgeon: Lollie Sails, MD;  Location: [facility] ENDOSCOPY;  Service: Endoscopy;  Laterality: N/A;   COLONOSCOPY WITH PROPOFOL N/A 09/06/2018   Procedure: COLONOSCOPY WITH PROPOFOL;  Surgeon: Lollie Sails, MD;  [facility]COPY;  Service: Endoscopy;  Laterality: N/A;   COLONOSCOPY WITH PROPOFOL N/A 05/16/2019   Procedure: COLONOSCOPY WITH PROPOFOL;  Surgeon: Lollie Sails, MD;  Location: [facility] ENDOSCOPY;  Service: Endoscopy;  Laterality: N/A;   CYST EXCISION     vocal cord   DBS placement     deep brain implant     ESOPHAGOGASTRODUODENOSCOPY (EGD) WITH PROPOFOL N/A 09/06/2018   Procedure: ESOPHAGOGASTRODUODENOSCOPY (EGD) WITH PROPOFOL;  Surgeon: Lollie Sails, MD;  Location: [facility] ENDOSCOPY;  Service: Endoscopy;  Laterality: N/A;   MELANOMA EXCISION     TONSILLECTOMY       reports that he has quit smoking. He has never used smokeless tobacco. He reports  current alcohol use of about 6.0 standard drinks per week. He reports that he does not currently use drugs.  Allergies  Allergen Reactions   Artane [Trihexyphenidyl] Anxiety   Propofol Other (See Comments)    Delirium Delirium Delirium Delirium     Family History  Problem Relation Age of Onset   Diabetes Father       Prior to Admission medications   Medication Sig Start Date End Date Taking? Authorizing Provider  bacitracin 500 UNIT/GM ointment Apply 1 application topically 2 (two) times daily. 07/05/20   [provider]  carbidopa-levodopa (SINEMET IR) 25-100 MG tablet Take 1.5 tablets by mouth 3 (three) times daily. Patient taking differently: Take 2 tablets by mouth 3 (three) times daily. 07/14/20 07/09/21  Darliss Cheney, MD  donepezil (ARICEPT) 10 MG tablet Take 10 mg by mouth daily. 08/06/20   [provider]  Misc Natural Products (ELDERBERRY ZINC/VIT C/IMMUNE) LOZG Use as directed 1 lozenge in the mouth or  throat daily.    [provider]  Multiple Vitamin (MULTIVITAMIN) tablet Take 1 tablet by mouth daily.    [provider]  pantoprazole (PROTONIX) 40 MG tablet Take 40 mg by mouth daily.    [provider]  QUEtiapine (SEROQUEL) 25 MG tablet Take 1 tablet (25 mg total) by mouth at bedtime. 07/14/20 07/09/21  Darliss Cheney, MD  sertraline (ZOLOFT) 50 MG tablet Take 50 mg by mouth daily. 06/26/21   [provider]  sildenafil (VIAGRA) 100 MG tablet Take 100 mg by mouth daily as needed for erectile dysfunction.    [provider]  tamsulosin (FLOMAX) 0.4 MG CAPS capsule Take 0.4 mg by mouth daily. 07/03/21   [provider]  urea (CARMOL) 40 % CREA Apply topically daily.    [provider]      Labs on Admission: I have personally reviewed following labs and imaging studies  CBC: Recent Labs  Lab 08/08/21 0105  WBC 20.4*  NEUTROABS 17.0*  HGB 14.4  HCT 43.1  MCV 91.3  PLT 035    Basic Metabolic Panel: Recent Labs  Lab 08/08/21 0105  NA 138  K 3.7  CL 107  CO2 21*  GLUCOSE 130*  BUN 33*  CREATININE 1.52*  CALCIUM 10.0   GFR: Estimated Creatinine Clearance: 43.3 mL/min (A) (by C-G formula based on SCr of 1.52 mg/dL (H)). Liver Function Tests: Recent Labs  Lab 08/08/21 0105  AST 26  ALT 24  ALKPHOS 87  BILITOT 2.4*  PROT 8.2*  ALBUMIN 4.0   Recent Labs  Lab 08/08/21 0105  LIPASE 35   No results for input(s): AMMONIA in the last 168 hours. Coagulation Profile: No results for input(s): INR, PROTIME in the last 168 hours. Cardiac Enzymes: No results for input(s): CKTOTAL, CKMB, CKMBINDEX, TROPONINI in the last 168 hours. BNP (last 3 results) No results for input(s): PROBNP in the last 8760 hours. HbA1C: No results for input(s): HGBA1C in the last 72 hours. CBG: No results for input(s): GLUCAP in the last 168 hours. Lipid Profile: No results for input(s): CHOL, HDL, LDLCALC, TRIG, CHOLHDL, LDLDIRECT in the last 72 hours. Thyroid Function Tests: No results for input(s): TSH, T4TOTAL, FREET4, T3FREE, THYROIDAB in the last 72 hours. Anemia Panel: No results for input(s): VITAMINB12, FOLATE, FERRITIN, TIBC, IRON, RETICCTPCT in the last 72 hours. Urine analysis:    Component Value Date/Time   COLORURINE YELLOW 07/09/2021 1928   APPEARANCEUR CLEAR 07/09/2021 1928   LABSPEC 1.010 07/09/2021 1928   PHURINE 5.5 07/09/2021 1928   GLUCOSEU NEGATIVE 07/09/2021 1928   HGBUR NEGATIVE 07/09/2021 [place] NEGATIVE 07/09/2021 1928   KETONESUR TRACE (A) 07/09/2021 1928   PROTEINUR NEGATIVE 07/09/2021 1928   NITRITE NEGATIVE 07/09/2021 1928   LEUKOCYTESUR NEGATIVE 07/09/2021 1928    Radiological Exams on Admission: CT Abdomen Pelvis Wo Contrast  Result Date: 08/08/2021 CLINICAL DATA:  Diarrhea, nausea vomiting. EXAM: CT ABDOMEN AND PELVIS WITHOUT CONTRAST TECHNIQUE: Multidetector CT imaging of the abdomen and pelvis was performed  following the standard protocol without IV contrast. COMPARISON:  None. FINDINGS: Evaluation of this exam is limited in the absence of intravenous contrast. Lower chest: Minimal bibasilar dependent atelectasis. The visualized lung bases are otherwise clear. There is 3 vessel coronary vascular calcification. No intra-abdominal free air or free fluid. Hepatobiliary: Small left hepatic hypodense lesion, likely a cyst. No intrahepatic biliary dilatation. The cholecystectomy. Pancreas: Unremarkable. No pancreatic ductal dilatation or surrounding inflammatory changes. Spleen: Normal in size without focal  abnormality. Adrenals/Urinary Tract: The adrenal glands unremarkable. There is a 1 cm exophytic lesion from the lateral interpolar left kidney which is not characterized. There is a 7 mm nonobstructing right renal inferior pole calculus. There is no hydronephrosis on either side. The visualized ureters appear unremarkable. There is a 2.5 cm stone within the urinary bladder. Stomach/Bowel: There is postsurgical changes of the bowel with anastomotic sutures. Several fluid-filled loops of small bowel in the right hemiabdomen may be physiologic or represent mild enteritis. No bowel obstruction. Loose stool throughout the colon compatible with diarrheal state. Vascular/Lymphatic: Moderate aortoiliac atherosclerotic disease. The IVC is unremarkable. No portal venous gas. There is no adenopathy. Reproductive: The prostate and seminal vesicles are grossly unremarkable no pelvic masses Other: Small fat containing umbilical hernia. Musculoskeletal: Degenerative changes of the spine. No acute osseous pathology. IMPRESSION: 1. Diarrheal state with possible mild enteritis. No bowel obstruction. 2. A 7 mm nonobstructing right renal inferior pole calculus. No hydronephrosis. 3. A 2.5 cm urinary bladder stone. 4. Aortic Atherosclerosis (ICD10-I70.0). Electronically Signed   By: Anner Crete M.D.   On: 08/08/2021 01:23   CT Head Wo  Contrast  Result Date: 08/08/2021 CLINICAL DATA:  Encephalopathy EXAM: CT HEAD WITHOUT CONTRAST TECHNIQUE: Contiguous axial images were obtained from the base of the skull through the vertex without intravenous contrast. COMPARISON:  None. FINDINGS: Brain: There is no mass, hemorrhage or extra-axial collection. The appearance of the white matter is normal for the patient's age. There is generalized atrophy. The brain stimulator lead terminates in the region of the right subthalamic nucleus. Vascular: No abnormal hyperdensity of the major intracranial arteries or dural venous sinuses. No intracranial atherosclerosis. Skull: The visualized skull base, calvarium and extracranial soft tissues are normal. Sinuses/Orbits: No fluid levels or advanced mucosal thickening of the visualized paranasal sinuses. No mastoid or middle ear effusion. The orbits are normal. IMPRESSION: 1. No acute intracranial abnormality. 2.  Cerebral Atrophy (ICD10-G31.9). Electronically Signed   By: Ulyses Jarred M.D.   On: 08/08/2021 01:40   DG Chest Port 1 View  Result Date: 08/08/2021 CLINICAL DATA:  AMS EXAM: PORTABLE CHEST 1 VIEW COMPARISON:  07/09/2021 FINDINGS: The heart size and mediastinal contours are within normal limits. Both lungs are clear. The visualized skeletal structures are unremarkable. Deep brain stimulator generator implanted in the right chest. IMPRESSION: No active disease. Electronically Signed   By: Ulyses Jarred M.D.   On: 08/08/2021 00:34       Athena Masse MD [facility]   08/08/2021, 3:18 AM

## 2021-08-08 NOTE — Progress Notes (Signed)
CODE SEPSIS - PHARMACY COMMUNICATION  **Broad Spectrum Antibiotics should be administered within 1 hour of Sepsis diagnosis**  Time Code Sepsis Called/Page Received:  12/25 @ 0208  Antibiotics Ordered:  Zosyn   Time of 1st antibiotic administration: 12/25 @ 0320  Additional action taken by pharmacy:  Messaged RN around 12/25 @ 0345 to remind her to give abx ... RN hung abx earlier but forgot to document   If necessary, Name of Provider/Nurse Contacted: Braulio Conte D ,PharmD Clinical Pharmacist  08/08/2021  3:53 AM

## 2021-08-08 NOTE — ED Notes (Signed)
Critical lab result Lactic 2.8 Notified EDP

## 2021-08-08 NOTE — Progress Notes (Signed)
Pt seen and examined.   Wesley Anderson. is a 75 y.o. male with medical history significant for Parkinson's disease s/p deep brain stimulator placement, chronic physical deconditioning, hospitalized a month ago for influenza A presenting with acute encephalopathy, who was brought in by EMS for evaluation of vomiting and diarrhea.  He was admitted for sepsis secondary to acute gastroenteritis.  Started on IV antibiotics and IV fluids.   Stool sample sent for analysis.    Hosie Poisson MD

## 2021-08-08 NOTE — ED Triage Notes (Signed)
Per wife at bedside pt also had altered mental status today. Per wife "patient was talking out of his head" and referencing the past.  Per wife has short term memory/mild dementia but this is extremely worse.

## 2021-08-09 LAB — BASIC METABOLIC PANEL
Anion gap: 4 — ABNORMAL LOW (ref 5–15)
BUN: 21 mg/dL (ref 8–23)
CO2: 22 mmol/L (ref 22–32)
Calcium: 8.5 mg/dL — ABNORMAL LOW (ref 8.9–10.3)
Chloride: 113 mmol/L — ABNORMAL HIGH (ref 98–111)
Creatinine, Ser: 1.13 mg/dL (ref 0.61–1.24)
GFR, Estimated: >60 mL/min (ref >60)
Glucose, Bld: 111 mg/dL — ABNORMAL HIGH (ref 70–99)
Potassium: 3.4 mmol/L — ABNORMAL LOW (ref 3.5–5.1)
Sodium: 139 mmol/L (ref 135–145)

## 2021-08-09 LAB — CBC
HCT: 34.9 % — ABNORMAL LOW (ref 39.0–52.0)
Hemoglobin: 11.6 g/dL — ABNORMAL LOW (ref 13.0–17.0)
MCH: 29.7 pg (ref 26.0–34.0)
MCHC: 33.2 g/dL (ref 30.0–36.0)
MCV: 89.3 fL (ref 80.0–100.0)
Platelets: 160 10*3/uL (ref 150–400)
RBC: 3.91 MIL/uL — ABNORMAL LOW (ref 4.22–5.81)
RDW: 14.5 % (ref 11.5–15.5)
WBC: 11.7 10*3/uL — ABNORMAL HIGH (ref 4.0–10.5)
nRBC: 0 % (ref 0.0–0.2)

## 2021-08-09 LAB — MAGNESIUM: Magnesium: 2 mg/dL (ref 1.7–2.4)

## 2021-08-09 MED ORDER — ENSURE ENLIVE PO LIQD
237.0000 mL | Freq: Three times a day (TID) | ORAL | Status: DC
Start: 2021-08-09 — End: 2021-08-11
  Administered 2021-08-09 – 2021-08-11 (×6): 237 mL via ORAL

## 2021-08-09 MED ORDER — ADULT MULTIVITAMIN W/MINERALS CH
1.0000 | ORAL_TABLET | Freq: Every day | ORAL | Status: DC
  Administered 2021-08-09 – 2021-08-11 (×3): 1 via ORAL
  Filled 2021-08-09 (×3): qty 1

## 2021-08-09 MED ORDER — PIPERACILLIN-TAZOBACTAM 3.375 G IVPB
3.3750 g | Freq: Three times a day (TID) | INTRAVENOUS | Status: DC
Start: 2021-08-09 — End: 2021-08-10
  Administered 2021-08-09 – 2021-08-10 (×3): 3.375 g via INTRAVENOUS
  Filled 2021-08-09 (×3): qty 50

## 2021-08-09 MED ORDER — SODIUM CHLORIDE 0.9 % IV SOLN: INTRAVENOUS | Status: DC

## 2021-08-09 MED ORDER — POTASSIUM CHLORIDE 10 MEQ/100ML IV SOLN
10.0000 meq | INTRAVENOUS | Status: AC
  Administered 2021-08-09 (×2): 10 meq via INTRAVENOUS
  Filled 2021-08-09 (×2): qty 100

## 2021-08-09 MED ORDER — PANTOPRAZOLE SODIUM 40 MG IV SOLR
40.0000 mg | Freq: Two times a day (BID) | INTRAVENOUS | Status: DC
  Administered 2021-08-09 – 2021-08-10 (×2): 40 mg via INTRAVENOUS
  Filled 2021-08-09 (×2): qty 40

## 2021-08-09 NOTE — Progress Notes (Signed)
PROGRESS NOTE    Wesley Anderson.  ZOX:[id number] DOB: 12/18/1945 DOA: 08/08/2021 PCP: Adin Hector, MD    Chief Complaint  Patient presents with   Nausea   Emesis    Brief Narrative:  Alba Kriesel. is a 75 y.o. male with medical history significant for Parkinson's disease s/p deep brain stimulator placement, chronic physical deconditioning, hospitalized a month ago for influenza A presenting with acute encephalopathy, who was brought in by EMS for evaluation of vomiting and diarrhea.   He was admitted for sepsis secondary to acute gastroenteritis.  Started on IV antibiotics and IV fluids.  Assessment & Plan:   Principal Problem:   Sepsis ([place]) Active Problems:   Parkinson's disease ([place])   AKI (acute kidney injury) ([place])   S/P deep brain stimulator placement   Acute gastroenteritis   Sepsis secondary to acute gastroenteritis:  Improving.  Pro calcitonin is 4.68 Diarrhea is better, abd pain resolved , but pt has nausea and vomiting.  Wife at bedside, and reported that pt had EGD and colonoscopy in 2020.  Stool is negative for c diff and GI pathogen PCR is negative.  D/c enteric precautions.  Wbc count improving with IV antibiotics.  Started on clears and slowly advanced diet to full liquid diet.     H/o Parkinson's disease:  - resume home meds   Hypokalemia  Replaced.   AKI:  Probably secondary to dehydration and poor perfusion.  Improved with IV fluids.   Creatinine improving.  Decrease the IV fluids.      DVT prophylaxis: lovenox Code Status: Full code.  Family Communication: family at bedside. Disposition:   Status is: Inpatient  Remains inpatient appropriate because: iv antibiotics       Consultants:  none  Procedures: none  Antimicrobials: Antibiotics Given (last 72 hours)     Date/Time Action Medication Dose Rate   08/08/21 0320 New Bag/Given   piperacillin-tazobactam (ZOSYN) IVPB 3.375 g 3.375 g 12.5 mL/hr    08/08/21 1115 New Bag/Given   piperacillin-tazobactam (ZOSYN) IVPB 3.375 g 3.375 g 12.5 mL/hr   08/08/21 2019 New Bag/Given   piperacillin-tazobactam (ZOSYN) IVPB 3.375 g 3.375 g 12.5 mL/hr   08/09/21 0313 New Bag/Given   piperacillin-tazobactam (ZOSYN) IVPB 3.375 g 3.375 g 12.5 mL/hr   08/09/21 1014 New Bag/Given   piperacillin-tazobactam (ZOSYN) IVPB 3.375 g 3.375 g 12.5 mL/hr         Subjective: Still having diarrhea, with some nausea and vomiting. Abd pain has resolved.   Objective: Vitals:   08/08/21 1605 08/08/21 2026 08/09/21 0631 08/09/21 0726  BP: 127/76 127/70 131/74 125/66  Pulse: (!) 57 (!) 51 (!) 51 (!) 52  Resp: 18  16 18   Temp: 97.9 F (36.6 C) 98.6 F (37 C) 97.9 F (36.6 C) 98.5 F (36.9 C)  TempSrc:      SpO2: 97% 98% 97% 97%  Weight:      Height:        Intake/Output Summary (Last 24 hours) at 08/09/2021 1329 Last data filed at 08/09/2021 0313 Gross per 24 hour  Intake 1150.42 ml  Output 100 ml  Net 1050.42 ml   Filed Weights   08/08/21 0223  Weight: 79.4 kg    Examination:  General exam: Appears calm and comfortable  Respiratory system: Clear to auscultation. Respiratory effort normal. Cardiovascular system: S1 & S2 heard, RRR. Marland Kitchen No pedal edema. Gastrointestinal system: Abdomen is nondistended, soft and nontender.  bowel sounds heard. Central nervous system: Alert  and oriented. No focal neurological deficits. Extremities: Symmetric 5 x 5 power. Skin: No rashes, lesions or ulcers Psychiatry:  Mood & affect appropriate.     Data Reviewed: I have personally reviewed following labs and imaging studies  CBC: Recent Labs  Lab 08/08/21 0105 08/08/21 1120 08/09/21 0848  WBC 20.4* 14.1* 11.7*  NEUTROABS 17.0*  --   --   HGB 14.4 11.9* 11.6*  HCT 43.1 35.4* 34.9*  MCV 91.3 89.6 89.3  PLT 200 172 767    Basic Metabolic Panel: Recent Labs  Lab 08/08/21 0105 08/08/21 1120 08/09/21 0848  NA 138 140 139  K 3.7 3.8 3.4*  CL 107  113* 113*  CO2 21* 22 22  GLUCOSE 130* 119* 111*  BUN 33* 29* 21  CREATININE 1.52* 1.29* 1.13  CALCIUM 10.0 8.6* 8.5*  MG  --   --  2.0    GFR: Estimated Creatinine Clearance: 58.2 mL/min (by C-G formula based on SCr of 1.13 mg/dL).  Liver Function Tests: Recent Labs  Lab 08/08/21 0105  AST 26  ALT 24  ALKPHOS 87  BILITOT 2.4*  PROT 8.2*  ALBUMIN 4.0    CBG: No results for input(s): GLUCAP in the last 168 hours.   Recent Results (from the past 240 hour(s))  Resp Panel by RT-PCR (Flu A&B, Covid) Nasopharyngeal Swab     Status: None   Collection Time: 08/08/21 12:59 AM   Specimen: Nasopharyngeal Swab; Nasopharyngeal(NP) swabs in vial transport medium  Result Value Ref Range Status   SARS Coronavirus 2 by RT PCR NEGATIVE NEGATIVE Final    Comment: (NOTE) SARS-CoV-2 target nucleic acids are NOT DETECTED.  The SARS-CoV-2 RNA is generally detectable in upper respiratory specimens during the acute phase of infection. The lowest concentration of SARS-CoV-2 viral copies this assay can detect is 138 copies/mL. A negative result does not preclude SARS-Cov-2 infection and should not be used as the sole basis for treatment or other patient management decisions. A negative result may occur with  improper specimen collection/handling, submission of specimen other than nasopharyngeal swab, presence of viral mutation(s) within the areas targeted by this assay, and inadequate number of viral copies(<138 copies/mL). A negative result must be combined with clinical observations, patient history, and epidemiological information. The expected result is Negative.  Fact Sheet for Patients:  EntrepreneurPulse.com.au  Fact Sheet for Healthcare Providers:  IncredibleEmployment.be  This test is no t yet approved or cleared by the [place] FDA and  has been authorized for detection and/or diagnosis of SARS-CoV-2 by FDA under an Emergency Use  Authorization (EUA). This EUA will remain  in effect (meaning this test can be used) for the duration of the COVID-19 declaration under Section 564(b)(1) of the Act, 21 U.S.C.section 360bbb-3(b)(1), unless the authorization is terminated  or revoked sooner.       Influenza A by PCR NEGATIVE NEGATIVE Final   Influenza B by PCR NEGATIVE NEGATIVE Final    Comment: (NOTE) The Xpert Xpress SARS-CoV-2/FLU/RSV plus assay is intended as an aid in the diagnosis of influenza from Nasopharyngeal swab specimens and should not be used as a sole basis for treatment. Nasal washings and aspirates are unacceptable for Xpert Xpress SARS-CoV-2/FLU/RSV testing.  Fact Sheet for Patients: EntrepreneurPulse.com.au  Fact Sheet for Healthcare Providers: IncredibleEmployment.be  This test is not yet approved or cleared by the [place] FDA and has been authorized for detection and/or diagnosis of SARS-CoV-2 by FDA under an Emergency Use Authorization (EUA). This EUA will remain in effect (  meaning this test can be used) for the duration of the COVID-19 declaration under Section 564(b)(1) of the Act, 21 U.S.C. section 360bbb-3(b)(1), unless the authorization is terminated or revoked.  Performed at [facility], [address] [place], [place] [address]   Culture, blood (routine x 2)     Status: None (Preliminary result)   Collection Time: 08/08/21  1:01 AM   Specimen: BLOOD  Result Value Ref Range Status   Specimen Description BLOOD RIGHT WRIST  Final   Special Requests   Final    BOTTLES DRAWN AEROBIC AND ANAEROBIC Blood Culture results may not be optimal due to an inadequate volume of blood received in culture bottles   Culture   Final    NO GROWTH 1 DAY Performed at [facility], [address] [place], [place] [address]    Report Status PENDING  Incomplete  Culture, blood (routine x 2)     Status: None (Preliminary result)    Collection Time: 08/08/21  1:01 AM   Specimen: BLOOD  Result Value Ref Range Status   Specimen Description BLOOD RIGHT ASSIST CONTROL  Final   Special Requests   Final    BOTTLES DRAWN AEROBIC AND ANAEROBIC Blood Culture adequate volume   Culture   Final    NO GROWTH 1 DAY Performed at [facility], [address] [place], [place] [address]    Report Status PENDING  Incomplete  C Difficile Quick Screen w PCR reflex     Status: None   Collection Time: 08/08/21  2:40 PM   Specimen: STOOL  Result Value Ref Range Status   C Diff antigen NEGATIVE NEGATIVE Final   C Diff toxin NEGATIVE NEGATIVE Final   C Diff interpretation No C. difficile detected.  Final    Comment: Performed at [facility], [address] [place], [place] [address]  Gastrointestinal Panel by PCR , Stool     Status: None   Collection Time: 08/08/21  2:40 PM   Specimen: Stool  Result Value Ref Range Status   Campylobacter species NOT DETECTED NOT DETECTED Final   Plesimonas shigelloides NOT DETECTED NOT DETECTED Final   Salmonella species NOT DETECTED NOT DETECTED Final   Yersinia enterocolitica NOT DETECTED NOT DETECTED Final   Vibrio species NOT DETECTED NOT DETECTED Final   Vibrio cholerae NOT DETECTED NOT DETECTED Final   Enteroaggregative E coli (EAEC) NOT DETECTED NOT DETECTED Final   Enteropathogenic E coli (EPEC) NOT DETECTED NOT DETECTED Final   Enterotoxigenic E coli (ETEC) NOT DETECTED NOT DETECTED Final   Shiga like toxin producing E coli (STEC) NOT DETECTED NOT DETECTED Final   Shigella/Enteroinvasive E coli (EIEC) NOT DETECTED NOT DETECTED Final   Cryptosporidium NOT DETECTED NOT DETECTED Final   Cyclospora cayetanensis NOT DETECTED NOT DETECTED Final   Entamoeba histolytica NOT DETECTED NOT DETECTED Final   Giardia lamblia NOT DETECTED NOT DETECTED Final   Adenovirus F40/41 NOT DETECTED NOT DETECTED Final   Astrovirus NOT DETECTED NOT DETECTED Final   Norovirus GI/GII NOT  DETECTED NOT DETECTED Final   Rotavirus A NOT DETECTED NOT DETECTED Final   Sapovirus (I, II, IV, and V) NOT DETECTED NOT DETECTED Final    Comment: Performed at [facility], [address] [place], [place] [address]         Radiology Studies: CT Abdomen Pelvis Wo Contrast  Result Date: 08/08/2021 CLINICAL DATA:  Diarrhea, nausea vomiting. EXAM: CT ABDOMEN AND PELVIS WITHOUT CONTRAST TECHNIQUE: Multidetector CT imaging  of the abdomen and pelvis was performed following the standard protocol without IV contrast. COMPARISON:  None. FINDINGS: Evaluation of this exam is limited in the absence of intravenous contrast. Lower chest: Minimal bibasilar dependent atelectasis. The visualized lung bases are otherwise clear. There is 3 vessel coronary vascular calcification. No intra-abdominal free air or free fluid. Hepatobiliary: Small left hepatic hypodense lesion, likely a cyst. No intrahepatic biliary dilatation. The cholecystectomy. Pancreas: Unremarkable. No pancreatic ductal dilatation or surrounding inflammatory changes. Spleen: Normal in size without focal abnormality. Adrenals/Urinary Tract: The adrenal glands unremarkable. There is a 1 cm exophytic lesion from the lateral interpolar left kidney which is not characterized. There is a 7 mm nonobstructing right renal inferior pole calculus. There is no hydronephrosis on either side. The visualized ureters appear unremarkable. There is a 2.5 cm stone within the urinary bladder. Stomach/Bowel: There is postsurgical changes of the bowel with anastomotic sutures. Several fluid-filled loops of small bowel in the right hemiabdomen may be physiologic or represent mild enteritis. No bowel obstruction. Loose stool throughout the colon compatible with diarrheal state. Vascular/Lymphatic: Moderate aortoiliac atherosclerotic disease. The IVC is unremarkable. No portal venous gas. There is no adenopathy. Reproductive: The prostate and seminal vesicles are  grossly unremarkable no pelvic masses Other: Small fat containing umbilical hernia. Musculoskeletal: Degenerative changes of the spine. No acute osseous pathology. IMPRESSION: 1. Diarrheal state with possible mild enteritis. No bowel obstruction. 2. A 7 mm nonobstructing right renal inferior pole calculus. No hydronephrosis. 3. A 2.5 cm urinary bladder stone. 4. Aortic Atherosclerosis (ICD10-I70.0). Electronically Signed   By: Anner Crete M.D.   On: 08/08/2021 01:23   CT Head Wo Contrast  Result Date: 08/08/2021 CLINICAL DATA:  Encephalopathy EXAM: CT HEAD WITHOUT CONTRAST TECHNIQUE: Contiguous axial images were obtained from the base of the skull through the vertex without intravenous contrast. COMPARISON:  None. FINDINGS: Brain: There is no mass, hemorrhage or extra-axial collection. The appearance of the white matter is normal for the patient's age. There is generalized atrophy. The brain stimulator lead terminates in the region of the right subthalamic nucleus. Vascular: No abnormal hyperdensity of the major intracranial arteries or dural venous sinuses. No intracranial atherosclerosis. Skull: The visualized skull base, calvarium and extracranial soft tissues are normal. Sinuses/Orbits: No fluid levels or advanced mucosal thickening of the visualized paranasal sinuses. No mastoid or middle ear effusion. The orbits are normal. IMPRESSION: 1. No acute intracranial abnormality. 2.  Cerebral Atrophy (ICD10-G31.9). Electronically Signed   By: Ulyses Jarred M.D.   On: 08/08/2021 01:40   DG Chest Port 1 View  Result Date: 08/08/2021 CLINICAL DATA:  AMS EXAM: PORTABLE CHEST 1 VIEW COMPARISON:  07/09/2021 FINDINGS: The heart size and mediastinal contours are within normal limits. Both lungs are clear. The visualized skeletal structures are unremarkable. Deep brain stimulator generator implanted in the right chest. IMPRESSION: No active disease. Electronically Signed   By: Ulyses Jarred M.D.   On:  08/08/2021 00:34        Scheduled Meds:  carbidopa-levodopa  2 tablet Oral TID   enoxaparin (LOVENOX) injection  40 mg Subcutaneous Q24H   pantoprazole (PROTONIX) IV  40 mg Intravenous Q24H   Continuous Infusions:  sodium chloride 100 mL/hr at 08/09/21 1213   piperacillin-tazobactam (ZOSYN)  IV     potassium chloride 10 mEq (08/09/21 1254)     LOS: 1 day        Hosie Poisson, MD [facility]   To contact the attending provider between 7A-7P or the  covering provider during after hours 7P-7A, please log into the web site [email or web address] and access using universal [place] password for that web site. If you do not have the password, please call the hospital operator.  08/09/2021, 1:29 PM

## 2021-08-09 NOTE — Progress Notes (Signed)
Initial Nutrition Assessment  DOCUMENTATION CODES:   Not applicable  INTERVENTION:   -Ensure Enlive po TID, each supplement provides 350 kcal and 20 grams of protein  -Magic cup TID with meals, each supplement provides 290 kcal and 9 grams of protein  -MVI with minerals daily  NUTRITION DIAGNOSIS:   Inadequate oral intake related to diarrhea, vomiting as evidenced by per patient/family report.  GOAL:   Patient will meet greater than or equal to 90% of their needs  MONITOR:   PO intake, Supplement acceptance, Diet advancement, Labs, Weight trends, Skin, I & O's  REASON FOR ASSESSMENT:   Malnutrition Screening Tool    ASSESSMENT:   Wesley Legan. is a 75 y.o. male with medical history significant for Parkinson's disease s/p deep brain stimulator placement, chronic physical deconditioning, hospitalized a month ago for influenza A presenting with acute encephalopathy, who was brought in by EMS for evaluation of vomiting and diarrhea.  Wife at the bedside who gives the history states that he was recovering well from his influenza and was at baseline until earlier in the day when he developed repeated episodes of vomiting, nonbloody nonbilious to where he was unable to keep down his Parkinson's meds.  As the day progressed he developed diarrhea with incontinence.  Stool is runny and without blood and not black he denies abdominal pain or fever.  No affected contacts and no recent unfamiliar foods.  He has had no recent cough or shortness of breath.  Pt admitted with sepsis and acute gastroenteritis.   Reviewed I/O's: +1.1 L x 24 hours and +2.1 L since admission   UOP: 100 ml x 24 hours  Spoke with pt and wife at bedside. Wife provided most of the history. Pt was in his usual state of health and consuming 3 meals per day PTA up until 2 days ago, when he developed vomiting and diarrhea. Per pt, he reported his stomach felt queasy two days ago and ate only a cheese danish for  breakfast, but vomited around supper time. Pt reports he tried to eat solid food, but did not tolerate it well, but is looking forward to trying some liquids.   Per wife, pt is hospitalized with GI distress about twice a year (pt actually spent the previous Christmas in the hospital for similar presentation). Pt had a small part of his colon removed approximately 5 years ago due to a large benign polyp and had a recent colonoscopy about 2 years ago which was WDL.  She is hopeful for a GI consult.   Pt wife reports pt usually loses about 5-10# during these periofds of GI symptoms, but usually gains it back. His UBW is around 175#. Pt thinks he now weighs around 150#. Reviewed wt hx; pt has experienced a 3.2% wt loss over the past 13 months, which is not significant for time frame.   Discussed importance of good meal and supplement intake to promote healing. Pt amenable to supplements.   Medications reviewed and include sinemet, lovenox, and 0.9% sodium chloride infusion @ 100 ml/hr.   Labs reviewed.   NUTRITION - FOCUSED PHYSICAL EXAM:  Flowsheet Row Most Recent Value  Orbital Region No depletion  Upper Arm Region No depletion  Thoracic and Lumbar Region No depletion  Buccal Region No depletion  Temple Region No depletion  Clavicle Bone Region No depletion  Clavicle and Acromion Bone Region No depletion  Scapular Bone Region No depletion  Dorsal Hand No depletion  Patellar Region Mild depletion  Anterior Thigh Region Mild depletion  Posterior Calf Region Mild depletion  Edema (RD Assessment) None  Hair Reviewed  Eyes Reviewed  Mouth Reviewed  Skin Reviewed  Nails Reviewed       Diet Order:   Diet Order             Diet full liquid Room service appropriate? Yes; Fluid consistency: Thin  Diet effective now                   EDUCATION NEEDS:   Education needs have been addressed  Skin:  Skin Assessment: Skin Integrity Issues: Skin Integrity Issues:: Other  (Comment) Other: opne wound rt buttocks  Last BM:  08/09/21  Height:   Ht Readings from Last 1 Encounters:  08/08/21 5\' 9"  (1.753 m)    Weight:   Wt Readings from Last 1 Encounters:  08/08/21 79.4 kg    Ideal Body Weight:  72.7 kg  BMI:  Body mass index is 25.84 kg/m.  Estimated Nutritional Needs:   Kcal:  1950-2150  Protein:  105-120 grams  Fluid:  > 1.9 L    Loistine Chance, RD, LDN, [place] Registered Dietitian II Certified Diabetes Care and Education Specialist Please refer to [facility] for RD and/or RD on-call/weekend/after hours pager

## 2021-08-09 NOTE — TOC Initial Note (Signed)
Transition of Care (TOC) - Initial/Assessment Note    Patient Details  Name: Wesley Anderson. MRN: [id number] Date of Birth: Oct 10, 1945  Transition of Care [facility]) CM/SW Contact:    Candie Chroman, LCSW Phone Number: 08/09/2021, 10:51 AM  Clinical Narrative:   Readmission prevention screen complete. This CSW working remote today. Called patient in the room, introduced role, and explained that discharge planning would be discussed. PCP is Ramonita Lab, MD. Wife drives him to appointments. [facility] [address]. No issues obtaining medications. Patient was active with [facility] for PT and RN prior to admission. Will resume at discharge. Patient uses a rollator and shower chair at home. Patient's insurance is not included on facesheet but he has [employer]. Sent secure email to financial counselor to notify. Cards are scanned into chart. No further concerns. CSW encouraged patient to contact CSW as needed. CSW will continue to follow patient for support and facilitate return home when stable.               Expected Discharge Plan: [place] Barriers to Discharge: Continued Medical Work up   Patient Goals and CMS Choice     Choice offered to / list presented to : NA  Expected Discharge Plan and Services Expected Discharge Plan: [place] Acute Care Choice: Resumption of Svcs/PTA Provider Living arrangements for the past 2 months: Single Family Home                           HH Arranged: RN, PT [facility] Agency: [place] Date [place]: 08/09/21   Representative spoke with at [place]: Mali Ortman  Prior Living Arrangements/Services Living arrangements for the past 2 months: [place] Lives with:: Spouse Patient language and need for interpreter reviewed:: Yes Do you feel safe going back to the place where you live?: Yes      Need for Family Participation in Patient Care: Yes  (Comment) Care giver support system in place?: Yes (comment) Current home services: DME, Home PT, Home RN Criminal Activity/Legal Involvement Pertinent to Current Situation/Hospitalization: No - Comment as needed  Activities of Daily Living Home Assistive Devices/Equipment: Environmental consultant (specify type) ADL Screening (condition at time of admission) Patient's cognitive ability adequate to safely complete daily activities?: Yes Is the patient deaf or have difficulty hearing?: No Does the patient have difficulty seeing, even when wearing glasses/contacts?: No Does the patient have difficulty concentrating, remembering, or making decisions?: No Patient able to express need for assistance with ADLs?: Yes Does the patient have difficulty dressing or bathing?: Yes Independently performs ADLs?: No Communication: Independent Dressing (OT): Needs assistance Is this a change from baseline?: Pre-admission baseline Grooming: Needs assistance Is this a change from baseline?: Pre-admission baseline Feeding: Independent Bathing: Needs assistance Toileting: Needs assistance Is this a change from baseline?: Pre-admission baseline In/Out Bed: Needs assistance Is this a change from baseline?: Pre-admission baseline Walks in Home: Needs assistance Is this a change from baseline?: Pre-admission baseline Does the patient have difficulty walking or climbing stairs?: Yes Weakness of Legs: Both Weakness of Arms/Hands: Both  Permission Sought/Granted Permission sought to share information with : Facility Art therapist granted to share information with : Yes, Verbal Permission Granted     Permission granted to share info w AGENCY: [facility]        Emotional Assessment Appearance:: Appears  stated age Attitude/Demeanor/Rapport: Engaged, Gracious Affect (typically observed): Accepting, Appropriate, Calm, Pleasant Orientation: : Oriented to Self, Oriented to Place, Oriented to   Time, Oriented to Situation Alcohol / Substance Use: Not Applicable Psych Involvement: No (comment)  Admission diagnosis:  Enteritis [K52.9] AKI (acute kidney injury) ([place]) [N17.9] Nausea vomiting and diarrhea [R11.2, R19.7] Sepsis ([place]) [A41.9] Fever, unspecified fever cause [R50.9] Altered mental status, unspecified altered mental status type [R41.82] Sepsis, due to unspecified organism, unspecified whether acute organ dysfunction present [facility]) [A41.9] Patient Active Problem List   Diagnosis Date Noted   Acute gastroenteritis 08/08/2021   Sepsis ([place]) 08/08/2021   Confusion    Anemia 07/09/2021   Weakness 07/09/2021   Fever and chills 07/09/2021   Myalgia 07/09/2021   Influenza A 15/61/5379   Acute metabolic encephalopathy 43/27/6147   AKI (acute kidney injury) ([place]) 07/09/2020   Leukocytosis 07/09/2020   S/P deep brain stimulator placement 07/09/2020   Squamous cell carcinoma, 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 [place] [address] Phone: [phone] Fax: [phone]     Social Determinants of Health (SDOH) Interventions    Readmission Risk Interventions Readmission Risk Prevention Plan 08/09/2021  Transportation Screening Complete  PCP or Specialist Appt within 3-5 Days Complete  HRI or Home Care Consult Complete  Social Work Consult for [place] Planning/Counseling Complete  Palliative Care Screening Not Applicable  Medication Review Press photographer) Complete  Some recent data might be hidden

## 2021-08-10 LAB — BASIC METABOLIC PANEL
Anion gap: 4 — ABNORMAL LOW (ref 5–15)
BUN: 13 mg/dL (ref 8–23)
CO2: 25 mmol/L (ref 22–32)
Calcium: 8.7 mg/dL — ABNORMAL LOW (ref 8.9–10.3)
Chloride: 112 mmol/L — ABNORMAL HIGH (ref 98–111)
Creatinine, Ser: 1.03 mg/dL (ref 0.61–1.24)
GFR, Estimated: >60 mL/min (ref >60)
Glucose, Bld: 121 mg/dL — ABNORMAL HIGH (ref 70–99)
Potassium: 3.8 mmol/L (ref 3.5–5.1)
Sodium: 141 mmol/L (ref 135–145)

## 2021-08-10 LAB — CBC
HCT: 34.8 % — ABNORMAL LOW (ref 39.0–52.0)
Hemoglobin: 11.7 g/dL — ABNORMAL LOW (ref 13.0–17.0)
MCH: 29.9 pg (ref 26.0–34.0)
MCHC: 33.6 g/dL (ref 30.0–36.0)
MCV: 89 fL (ref 80.0–100.0)
Platelets: 156 10*3/uL (ref 150–400)
RBC: 3.91 MIL/uL — ABNORMAL LOW (ref 4.22–5.81)
RDW: 14.4 % (ref 11.5–15.5)
WBC: 12.7 10*3/uL — ABNORMAL HIGH (ref 4.0–10.5)
nRBC: 0 % (ref 0.0–0.2)

## 2021-08-10 MED ORDER — CIPROFLOXACIN HCL 500 MG PO TABS
500.0000 mg | ORAL_TABLET | Freq: Two times a day (BID) | ORAL | Status: DC
  Administered 2021-08-10 – 2021-08-11 (×2): 500 mg via ORAL
  Filled 2021-08-10 (×2): qty 1

## 2021-08-10 MED ORDER — PANTOPRAZOLE SODIUM 40 MG PO TBEC
40.0000 mg | DELAYED_RELEASE_TABLET | Freq: Two times a day (BID) | ORAL | Status: DC
  Administered 2021-08-10 – 2021-08-11 (×2): 40 mg via ORAL
  Filled 2021-08-10 (×2): qty 1

## 2021-08-10 MED ORDER — METRONIDAZOLE 500 MG PO TABS
500.0000 mg | ORAL_TABLET | Freq: Two times a day (BID) | ORAL | Status: DC
  Administered 2021-08-10 – 2021-08-11 (×3): 500 mg via ORAL
  Filled 2021-08-10 (×3): qty 1

## 2021-08-10 NOTE — Evaluation (Signed)
Occupational Therapy Evaluation Patient Details Name: Wesley Anderson. MRN: [id number] DOB: 1946/06/12 Today's Date: 08/10/2021   History of Present Illness Wesley Anderson. is a 75 y.o. male with medical history significant for Parkinson's disease s/p deep brain stimulator placement, chronic physical deconditioning, hospitalized a month ago for influenza A presenting with acute encephalopathy, who was brought in by EMS for evaluation of vomiting and diarrhea.   Clinical Impression   Wesley Anderson presents with generalized weakness, limited endurance, UB and LB tremors. He lives with his wife, who assists him with IADLs, he performs ADLs with Mod I. Two children live nearby and assist with care as needed. Today pt is able to engage in bed mobility, therex, transfers. He is able to maintain standing balance for ~3 minutes before tiring and requesting to sit, but experiences several episodes of LOB. Pt reports 2 falls in previous 2 months. Pt will benefit from ongoing OT while hospitalized, to assist with strength, balance, endurance, falls prevention. He is currently receiving HHPT and OT, which he reports has been very helpful to him -- continue these services post-DC.    Recommendations for follow up therapy are one component of a multi-disciplinary discharge planning process, led by the attending physician.  Recommendations may be updated based on patient status, additional functional criteria and insurance authorization.   Follow Up Recommendations  Home health OT    Assistance Recommended at Discharge Intermittent Supervision/Assistance  Functional Status Assessment  Patient has had a recent decline in their functional status and demonstrates the ability to make significant improvements in function in a reasonable and predictable amount of time.  Equipment Recommendations  None recommended by OT    Recommendations for Other Services       Precautions / Restrictions  Precautions Precautions: Fall Restrictions Weight Bearing Restrictions: No      Mobility Bed Mobility Overal bed mobility: Needs Assistance Bed Mobility: Supine to Sit     Supine to sit: Supervision     General bed mobility comments: greatly increased time    Transfers Overall transfer level: Needs assistance Equipment used: Rolling walker (2 wheels) Transfers: Bed to chair/wheelchair/BSC;Sit to/from Stand Sit to Stand: Min assist Stand pivot transfers: Min assist                Balance Overall balance assessment: Needs assistance Sitting-balance support: Feet supported;Single extremity supported Sitting balance-Leahy Scale: Fair     Standing balance support: Bilateral upper extremity supported Standing balance-Leahy Scale: Poor Standing balance comment: several LOB episodes in standing, pt able to self-correct                           ADL either performed or assessed with clinical judgement   ADL Overall ADL's : Needs assistance/impaired Eating/Feeding: Modified independent;Set up Eating/Feeding Details (indicate cue type and reason): able to open containers, self-feed, with greatly increased time and some spillage                                 Functional mobility during ADLs: Minimal assistance       Vision Baseline Vision/History: 1 Wears glasses       Perception     Praxis      Pertinent Vitals/Pain Pain Assessment: No/denies pain Breathing: normal Negative Vocalization: none Facial Expression: smiling or inexpressive Body Language: relaxed Consolability: no need to console PAINAD Score: 0  Hand Dominance Right   Extremity/Trunk Assessment Upper Extremity Assessment Upper Extremity Assessment: Generalized weakness (b/l UE tremors)   Lower Extremity Assessment Lower Extremity Assessment: Generalized weakness (L LE tremor)       Communication Communication Communication: No difficulties   Cognition  Arousal/Alertness: Awake/alert Behavior During Therapy: WFL for tasks assessed/performed Overall Cognitive Status: Within Functional Limits for tasks assessed                                       General Comments       Exercises Other Exercises Other Exercises: bed mobility, UE therex in supine, transfers, standing balance tolerance, self-feeding; educ re: POC, DC recs, falls prevention   Shoulder Instructions      Home Living Family/patient expects to be discharged to:: Private residence Living Arrangements: Spouse/significant other Available Help at Discharge: Family;Available 24 hours/day Type of Home: House Home Access: Stairs to enter [employer] of Steps: 2+2   Home Layout: One level     Bathroom Shower/Tub: Occupational psychologist: Standard (BSC over toilet)     Home Equipment: Conservation officer, nature (2 wheels);Wheelchair - manual;Grab bars - tub/shower;BSC/3in1          Prior Functioning/Environment Prior Level of Function : Needs assist             Mobility Comments: Has been using RW but is now transitioning to manual WC ADLs Comments: Requires assistance with IADLs. Performs dressing, showering, toileting with Mod I        OT Problem List: Decreased strength;Impaired balance (sitting and/or standing);Decreased range of motion;Decreased activity tolerance;Decreased coordination      OT Treatment/Interventions: Self-care/ADL training;DME and/or AE instruction;Therapeutic activities;Balance training;Therapeutic exercise;Energy conservation;Patient/family education    OT Goals(Current goals can be found in the care plan section) Acute Rehab OT Goals Patient Stated Goal: to figure out what next steps are for him and his wife OT Goal Formulation: With patient Time For Goal Achievement: 08/24/21 Potential to Achieve Goals: Good ADL Goals Pt Will Transfer to Toilet: with supervision (using LRAD) Pt/caregiver will Perform  Home Exercise Program: Increased ROM;Increased strength;Independently Additional ADL Goal #1: Pt will identify/demonstrate 2+ energy conservation techniques  OT Frequency: Min 2X/week   Barriers to D/C:            Co-evaluation              AM-PAC OT "6 Clicks" Daily Activity     Outcome Measure Help from another person eating meals?: None Help from another person taking care of personal grooming?: A Little Help from another person toileting, which includes using toliet, bedpan, or urinal?: A Little Help from another person bathing (including washing, rinsing, drying)?: A Little Help from another person to put on and taking off regular upper body clothing?: A Little Help from another person to put on and taking off regular lower body clothing?: A Little 6 Click Score: 19   End of Session Equipment Utilized During Treatment: Rolling walker (2 wheels) Nurse Communication: Mobility status  Activity Tolerance: Patient tolerated treatment well Patient left: in chair;with call bell/phone within reach;with chair alarm set  OT Visit Diagnosis: Unsteadiness on feet (R26.81);Muscle weakness (generalized) (M62.81);Other abnormalities of gait and mobility (R26.89);History of falling (Z91.81)                Time: 4356-8616 OT Time Calculation (min): 20 min Charges:  OT General Charges $OT Visit:  1 Visit OT Evaluation $OT Eval Low Complexity: 1 Low OT Treatments $Self Care/Home Management : 8-22 mins Josiah Lobo, PhD, MS, OTR/L 08/10/21, 9:28 AM

## 2021-08-10 NOTE — Evaluation (Signed)
Physical Therapy Evaluation Patient Details Name: Wesley Anderson. MRN: [id number] DOB: Aug 20, 1945 Today's Date: 08/10/2021  History of Present Illness  Pt is a 75 y.o. male with medical history significant for Parkinson's disease s/p deep brain stimulator placement, chronic physical deconditioning, hospitalized a month ago for influenza A presenting with acute encephalopathy, who was brought in by EMS for evaluation of vomiting and diarrhea. MD assessment includes: Sepsis secondary to acute gastroenteritis, hypokalemia, and AKI.   Clinical Impression  Pt was pleasant and motivated to participate during the session and put forth good effort throughout. Pt required no physical assistance with functional tasks and presented with good control and stability during transfer and gait training.  Pt reported no adverse symptoms during the session with SpO2 and HR WNL on room air.  Pt will benefit from continuing with his current HHPT upon discharge to safely address deficits listed in patient problem list for decreased caregiver assistance and eventual return to PLOF.         Recommendations for follow up therapy are one component of a multi-disciplinary discharge planning process, led by the attending physician.  Recommendations may be updated based on patient status, additional functional criteria and insurance authorization.  Follow Up Recommendations Home health PT (Pt currently receiving HHPT)    Assistance Recommended at Discharge Frequent or constant Supervision/Assistance  Functional Status Assessment Patient has had a recent decline in their functional status and demonstrates the ability to make significant improvements in function in a reasonable and predictable amount of time.  Equipment Recommendations  None recommended by PT    Recommendations for Other Services       Precautions / Restrictions Precautions Precautions: Fall Restrictions Weight Bearing Restrictions: No       Mobility  Bed Mobility Overal bed mobility: Modified Independent Bed Mobility: Supine to Sit     Supine to sit: Supervision     General bed mobility comments: Increased time and effort    Transfers Overall transfer level: Needs assistance Equipment used: Rolling walker (2 wheels) Transfers: Sit to/from Stand Sit to Stand: Supervision Stand pivot transfers: Min assist         General transfer comment: Very good concentric and eccentric control and stability from bed in lowest position and from recliner    Ambulation/Gait Ambulation/Gait assistance: Supervision Gait Distance (Feet): 60 Feet Assistive device: Rolling walker (2 wheels) Gait Pattern/deviations: Step-through pattern;Decreased step length - right;Decreased step length - left Gait velocity: decreased     General Gait Details: Slow cadence with short B step length but steady without LOB; min verbal cues for amb closer to RW with upright posture  Stairs            Wheelchair Mobility    Modified Rankin (Stroke Patients Only)       Balance Overall balance assessment: Needs assistance Sitting-balance support: No upper extremity supported;Feet unsupported Sitting balance-Leahy Scale: Good     Standing balance support: Bilateral upper extremity supported;During functional activity Standing balance-Leahy Scale: Fair Standing balance comment: No overt LOB during standing/gait                             Pertinent Vitals/Pain Pain Assessment: No/denies pain    Home Living Family/patient expects to be discharged to:: Private residence Living Arrangements: Spouse/significant other Available Help at Discharge: Family;Available 24 hours/day Type of Home: House Home Access: Stairs to enter Entrance Stairs-Rails: Can reach both;Left;Right Entrance Stairs-Number of Steps: 2  Home Layout: One level Home Equipment: Conservation officer, nature (2 wheels);Wheelchair - manual;Grab bars -  tub/shower;BSC/3in1;Shower seat - built in      Prior Function Prior Level of Function : Needs assist             Mobility Comments: RW for household distances or very limited community distances, 4 falls in the last year secondary to either tripping or general LOB ADLs Comments: Requires assistance with IADLs. Performs dressing, showering, toileting with Mod I     Hand Dominance   Dominant Hand: Right    Extremity/Trunk Assessment   Upper Extremity Assessment Upper Extremity Assessment: Generalized weakness    Lower Extremity Assessment Lower Extremity Assessment: Generalized weakness       Communication   Communication: No difficulties  Cognition Arousal/Alertness: Awake/alert Behavior During Therapy: WFL for tasks assessed/performed Overall Cognitive Status: Within Functional Limits for tasks assessed                                          General Comments      Exercises Total Joint Exercises Ankle Circles/Pumps: AROM;Strengthening;Both;10 reps Quad Sets: Strengthening;Both;10 reps Heel Slides: Strengthening;Both;5 reps Hip ABduction/ADduction: Strengthening;Both;10 reps Straight Leg Raises: Strengthening;Both;10 reps Long Arc Quad: AROM;Strengthening;Both;10 reps Marching in Standing: AROM;Strengthening;5 reps;Standing Other Exercises Other Exercises: bed mobility, UE therex in supine, transfers, standing balance tolerance, self-feeding; educ re: POC, DC recs, falls prevention   Assessment/Plan    PT Assessment Patient needs continued PT services  PT Problem List Decreased strength;Decreased balance;Decreased mobility;Decreased knowledge of use of DME       PT Treatment Interventions DME instruction;Gait training;Stair training;Functional mobility training;Therapeutic activities;Therapeutic exercise;Balance training;Patient/family education    PT Goals (Current goals can be found in the Care Plan section)  Acute Rehab PT  Goals Patient Stated Goal: To get stronger PT Goal Formulation: With patient Time For Goal Achievement: 08/23/21 Potential to Achieve Goals: Good    Frequency Min 2X/week   Barriers to discharge        Co-evaluation               AM-PAC PT "6 Clicks" Mobility  Outcome Measure Help needed turning from your back to your side while in a flat bed without using bedrails?: A Little Help needed moving from lying on your back to sitting on the side of a flat bed without using bedrails?: A Little Help needed moving to and from a bed to a chair (including a wheelchair)?: A Little Help needed standing up from a chair using your arms (e.g., wheelchair or bedside chair)?: A Little Help needed to walk in hospital room?: A Little Help needed climbing 3-5 steps with a railing? : A Little 6 Click Score: 18    End of Session Equipment Utilized During Treatment: Gait belt Activity Tolerance: Patient tolerated treatment well Patient left: in chair;with call bell/phone within reach;with chair alarm set;Other (comment) (lap belt chair alarm donned per nsg request) Nurse Communication: Mobility status PT Visit Diagnosis: Unsteadiness on feet (R26.81);History of falling (Z91.81);Muscle weakness (generalized) (M62.81);Difficulty in walking, not elsewhere classified (R26.2)    Time: 4967-5916 PT Time Calculation (min) (ACUTE ONLY): 31 min   Charges:   PT Evaluation $PT Eval Moderate Complexity: 1 Mod PT Treatments $Therapeutic Exercise: 8-22 mins       D. Scott Newel Oien PT, DPT 08/10/21, 1:07 PM

## 2021-08-10 NOTE — Progress Notes (Signed)
PHARMACIST - PHYSICIAN COMMUNICATION  DR: Karleen Hampshire  CONCERNING: IV to Oral Route Change Policy  RECOMMENDATION: This patient is receiving pantoprazole by the intravenous route.  Based on criteria approved by the Pharmacy and Therapeutics Committee, the intravenous medication(s) is/are being converted to the equivalent oral dose form(s).   DESCRIPTION: These criteria include: The patient is eating (either orally or via tube) and/or has been taking other orally administered medications for a least 24 hours The patient has no evidence of active gastrointestinal bleeding or impaired GI absorption (gastrectomy, short bowel, patient on TNA or NPO).  If you have questions about this conversion, please contact the Pharmacy Department  []   [phone] )  [facility] [x]   [phone] )  [facility] []   [phone] )  [facility] []   [phone] )  [facility] []   [phone] )  [place] 08/10/2021 10:53 AM

## 2021-08-10 NOTE — Plan of Care (Signed)

## 2021-08-10 NOTE — Progress Notes (Signed)
PROGRESS NOTE    Wesley Anderson.  OZD:[id number] DOB: 12/27/1945 DOA: 08/08/2021 PCP: Adin Hector, MD    Chief Complaint  Patient presents with   Nausea   Emesis    Brief Narrative:  Wesley Anderson. is a 75 y.o. male with medical history significant for Parkinson's disease s/p deep brain stimulator placement, chronic physical deconditioning, hospitalized a month ago for influenza A presenting with acute encephalopathy, who was brought in by EMS for evaluation of vomiting and diarrhea.   He was admitted for sepsis secondary to acute gastroenteritis vs acute diverticulosis.  He was Started on IV antibiotics and IV fluids. He reports his diarrhea has improved, denies any nausea, vomiting or abdominal pain now. His diet advanced today to regular diet. If able to tolerate regular diet without any nausea, vomiting or diarrhea, plan for discharge in the morning.    Assessment & Plan:   Principal Problem:   Sepsis ([place]) Active Problems:   Parkinson's disease ([place])   AKI (acute kidney injury) ([place])   S/P deep brain stimulator placement   Acute gastroenteritis   Sepsis secondary to acute gastroenteritis vs from acute diverticulosis.  Improving.  Pro calcitonin is 4.68 on admission.  Diarrhea is better, abd pain, nausea and vomiting have resolved Wife at bedside, and reported that pt had EGD and colonoscopy in 2020.  Stool is negative for c diff and GI pathogen PCR is negative.  D/c enteric precautions.  Wbc count improving with IV antibiotics.  Transition to oral antibiotics today and monitor. Started on clears advance to full liquid diet and is able to tolerate.  Will advance to regular diet today and monitor.  Patient is very adamant about going home.    H/o Parkinson's disease:  - resume home meds   Hypokalemia  Replaced.   AKI:  Probably secondary to dehydration and poor perfusion.  Improved with IV fluids.  Decrease the IV fluids . creatinine appears  to be at baseline.     DVT prophylaxis: lovenox Code Status: Full code.  Family Communication: None at bedside today, discussed the plan with the patient. Disposition:   Status is: Inpatient  Remains inpatient appropriate because: iv antibiotics       Consultants:  none  Procedures: none  Antimicrobials: Antibiotics Given (last 72 hours)     Date/Time Action Medication Dose Rate   08/08/21 0320 New Bag/Given   piperacillin-tazobactam (ZOSYN) IVPB 3.375 g 3.375 g 12.5 mL/hr   08/08/21 1115 New Bag/Given   piperacillin-tazobactam (ZOSYN) IVPB 3.375 g 3.375 g 12.5 mL/hr   08/08/21 2019 New Bag/Given   piperacillin-tazobactam (ZOSYN) IVPB 3.375 g 3.375 g 12.5 mL/hr   08/09/21 0313 New Bag/Given   piperacillin-tazobactam (ZOSYN) IVPB 3.375 g 3.375 g 12.5 mL/hr   08/09/21 1014 New Bag/Given   piperacillin-tazobactam (ZOSYN) IVPB 3.375 g 3.375 g 12.5 mL/hr   08/09/21 1753 New Bag/Given   piperacillin-tazobactam (ZOSYN) IVPB 3.375 g 3.375 g 12.5 mL/hr   08/10/21 0104 New Bag/Given   piperacillin-tazobactam (ZOSYN) IVPB 3.375 g 3.375 g 12.5 mL/hr   08/10/21 0846 New Bag/Given   piperacillin-tazobactam (ZOSYN) IVPB 3.375 g 3.375 g 12.5 mL/hr         Subjective: Still having diarrhea, with some nausea and vomiting. Abd pain has resolved.   Objective: Vitals:   08/09/21 2324 08/10/21 0422 08/10/21 0800 08/10/21 1208  BP: 134/76 137/75 134/68 129/66  Pulse: (!) 51 (!) 50 (!) 50 (!) 46  Resp: 14 16 18  18  Temp: 98.2 F (36.8 C) 98.7 F (37.1 C) 98.4 F (36.9 C) 98 F (36.7 C)  TempSrc:      SpO2: 98% 96% 98% 98%  Weight:      Height:        Intake/Output Summary (Last 24 hours) at 08/10/2021 1427 Last data filed at 08/10/2021 0442 Gross per 24 hour  Intake 350 ml  Output 300 ml  Net 50 ml    Filed Weights   08/08/21 0223  Weight: 79.4 kg    Examination:  General exam: Appears calm and comfortable  Respiratory system: Clear to auscultation.  Respiratory effort normal. Cardiovascular system: S1 & S2 heard, RRR. No JVD,  No pedal edema. Gastrointestinal system: Abdomen is nondistended, soft and nontender. Normal bowel sounds heard. Central nervous system: Alert and oriented. No focal neurological deficits. Extremities: Symmetric 5 x 5 power. Skin: No rashes, lesions or ulcers Psychiatry: Mood & affect appropriate.      Data Reviewed: I have personally reviewed following labs and imaging studies  CBC: Recent Labs  Lab 08/08/21 0105 08/08/21 1120 08/09/21 0848 08/10/21 0352  WBC 20.4* 14.1* 11.7* 12.7*  NEUTROABS 17.0*  --   --   --   HGB 14.4 11.9* 11.6* 11.7*  HCT 43.1 35.4* 34.9* 34.8*  MCV 91.3 89.6 89.3 89.0  PLT 200 172 160 156     Basic Metabolic Panel: Recent Labs  Lab 08/08/21 0105 08/08/21 1120 08/09/21 0848 08/10/21 0352  NA 138 140 139 141  K 3.7 3.8 3.4* 3.8  CL 107 113* 113* 112*  CO2 21* 22 22 25   GLUCOSE 130* 119* 111* 121*  BUN 33* 29* 21 13  CREATININE 1.52* 1.29* 1.13 1.03  CALCIUM 10.0 8.6* 8.5* 8.7*  MG  --   --  2.0  --      GFR: Estimated Creatinine Clearance: 63.9 mL/min (by C-G formula based on SCr of 1.03 mg/dL).  Liver Function Tests: Recent Labs  Lab 08/08/21 0105  AST 26  ALT 24  ALKPHOS 87  BILITOT 2.4*  PROT 8.2*  ALBUMIN 4.0     CBG: No results for input(s): GLUCAP in the last 168 hours.   Recent Results (from the past 240 hour(s))  Resp Panel by RT-PCR (Flu A&B, Covid) Nasopharyngeal Swab     Status: None   Collection Time: 08/08/21 12:59 AM   Specimen: Nasopharyngeal Swab; Nasopharyngeal(NP) swabs in vial transport medium  Result Value Ref Range Status   SARS Coronavirus 2 by RT PCR NEGATIVE NEGATIVE Final    Comment: (NOTE) SARS-CoV-2 target nucleic acids are NOT DETECTED.  The SARS-CoV-2 RNA is generally detectable in upper respiratory specimens during the acute phase of infection. The lowest concentration of SARS-CoV-2 viral copies this assay  can detect is 138 copies/mL. A negative result does not preclude SARS-Cov-2 infection and should not be used as the sole basis for treatment or other patient management decisions. A negative result may occur with  improper specimen collection/handling, submission of specimen other than nasopharyngeal swab, presence of viral mutation(s) within the areas targeted by this assay, and inadequate number of viral copies(<138 copies/mL). A negative result must be combined with clinical observations, patient history, and epidemiological information. The expected result is Negative.  Fact Sheet for Patients:  EntrepreneurPulse.com.au  Fact Sheet for Healthcare Providers:  IncredibleEmployment.be  This test is no t yet approved or cleared by the [place] FDA and  has been authorized for detection and/or diagnosis of SARS-CoV-2 by FDA under an  Emergency Use Authorization (EUA). This EUA will remain  in effect (meaning this test can be used) for the duration of the COVID-19 declaration under Section 564(b)(1) of the Act, 21 U.S.C.section 360bbb-3(b)(1), unless the authorization is terminated  or revoked sooner.       Influenza A by PCR NEGATIVE NEGATIVE Final   Influenza B by PCR NEGATIVE NEGATIVE Final    Comment: (NOTE) The Xpert Xpress SARS-CoV-2/FLU/RSV plus assay is intended as an aid in the diagnosis of influenza from Nasopharyngeal swab specimens and should not be used as a sole basis for treatment. Nasal washings and aspirates are unacceptable for Xpert Xpress SARS-CoV-2/FLU/RSV testing.  Fact Sheet for Patients: EntrepreneurPulse.com.au  Fact Sheet for Healthcare Providers: IncredibleEmployment.be  This test is not yet approved or cleared by the [place] FDA and has been authorized for detection and/or diagnosis of SARS-CoV-2 by FDA under an Emergency Use Authorization (EUA). This EUA will  remain in effect (meaning this test can be used) for the duration of the COVID-19 declaration under Section 564(b)(1) of the Act, 21 U.S.C. section 360bbb-3(b)(1), unless the authorization is terminated or revoked.  Performed at [facility], [address] [place], [place] [address]   Culture, blood (routine x 2)     Status: None (Preliminary result)   Collection Time: 08/08/21  1:01 AM   Specimen: BLOOD  Result Value Ref Range Status   Specimen Description BLOOD RIGHT WRIST  Final   Special Requests   Final    BOTTLES DRAWN AEROBIC AND ANAEROBIC Blood Culture results may not be optimal due to an inadequate volume of blood received in culture bottles   Culture   Final    NO GROWTH 2 DAYS Performed at [facility], [address], [place], [place] [address]    Report Status PENDING  Incomplete  Culture, blood (routine x 2)     Status: None (Preliminary result)   Collection Time: 08/08/21  1:01 AM   Specimen: BLOOD  Result Value Ref Range Status   Specimen Description BLOOD RIGHT ASSIST CONTROL  Final   Special Requests   Final    BOTTLES DRAWN AEROBIC AND ANAEROBIC Blood Culture adequate volume   Culture   Final    NO GROWTH 2 DAYS Performed at [facility], [address], [place], [place] [address]    Report Status PENDING  Incomplete  C Difficile Quick Screen w PCR reflex     Status: None   Collection Time: 08/08/21  2:40 PM   Specimen: STOOL  Result Value Ref Range Status   C Diff antigen NEGATIVE NEGATIVE Final   C Diff toxin NEGATIVE NEGATIVE Final   C Diff interpretation No C. difficile detected.  Final    Comment: Performed at [facility], [address] [place], [place] [address]  Gastrointestinal Panel by PCR , Stool     Status: None   Collection Time: 08/08/21  2:40 PM   Specimen: Stool  Result Value Ref Range Status   Campylobacter species NOT DETECTED NOT DETECTED Final   Plesimonas shigelloides NOT DETECTED NOT  DETECTED Final   Salmonella species NOT DETECTED NOT DETECTED Final   Yersinia enterocolitica NOT DETECTED NOT DETECTED Final   Vibrio species NOT DETECTED NOT DETECTED Final   Vibrio cholerae NOT DETECTED NOT DETECTED Final   Enteroaggregative E coli (EAEC) NOT DETECTED NOT DETECTED Final   Enteropathogenic E coli (EPEC) NOT DETECTED NOT DETECTED Final   Enterotoxigenic E coli (ETEC) NOT DETECTED NOT  DETECTED Final   Shiga like toxin producing E coli (STEC) NOT DETECTED NOT DETECTED Final   Shigella/Enteroinvasive E coli (EIEC) NOT DETECTED NOT DETECTED Final   Cryptosporidium NOT DETECTED NOT DETECTED Final   Cyclospora cayetanensis NOT DETECTED NOT DETECTED Final   Entamoeba histolytica NOT DETECTED NOT DETECTED Final   Giardia lamblia NOT DETECTED NOT DETECTED Final   Adenovirus F40/41 NOT DETECTED NOT DETECTED Final   Astrovirus NOT DETECTED NOT DETECTED Final   Norovirus GI/GII NOT DETECTED NOT DETECTED Final   Rotavirus A NOT DETECTED NOT DETECTED Final   Sapovirus (I, II, IV, and V) NOT DETECTED NOT DETECTED Final    Comment: Performed at [facility], [address], [place], [place] [address]          Radiology Studies: No results found.      Scheduled Meds:  carbidopa-levodopa  2 tablet Oral TID   ciprofloxacin  500 mg Oral BID   enoxaparin (LOVENOX) injection  40 mg Subcutaneous Q24H   feeding supplement  237 mL Oral TID BM   metroNIDAZOLE  500 mg Oral Q12H   multivitamin with minerals  1 tablet Oral Daily   pantoprazole  40 mg Oral BID   Continuous Infusions:  sodium chloride 100 mL/hr at 08/10/21 0703     LOS: 2 days        Hosie Poisson, MD [facility]   To contact the attending provider between 7A-7P or the covering provider during after hours 7P-7A, please log into the web site [email or web address] and access using universal [place] password for that web site. If you do not have the password, please call the hospital  operator.  08/10/2021, 2:27 PM

## 2021-08-11 DIAGNOSIS — Z515 Encounter for palliative care: Secondary | ICD-10-CM

## 2021-08-11 DIAGNOSIS — G20 Parkinson's disease: Secondary | ICD-10-CM

## 2021-08-11 MED ORDER — METRONIDAZOLE 500 MG PO TABS: 500.0000 mg | ORAL_TABLET | Freq: Two times a day (BID) | ORAL | 0 refills | Status: DC

## 2021-08-11 MED ORDER — CIPROFLOXACIN HCL 500 MG PO TABS: 500.0000 mg | ORAL_TABLET | Freq: Two times a day (BID) | ORAL | 0 refills | Status: DC

## 2021-08-11 NOTE — Discharge Summary (Signed)
Physician Discharge Summary  Wesley Anderson. ZHG:[id number] DOB: February 13, 1946 DOA: 08/08/2021  PCP: Adin Hector, MD  Admit date: 08/08/2021 Discharge date: 08/11/2021  Admitted From: Home Disposition: Home  Recommendations for Outpatient Follow-up:  Follow up with PCP in 1-2 weeks Please obtain BMP/CBC in one week  Home Health: None Equipment/Devices: None  Discharge Condition: Stable CODE STATUS: Full Diet recommendation: Low-salt low-fat diet  Brief/Interim Summary: Wesley Keesling. is a 75 y.o. male with medical history significant for Parkinson's disease s/p deep brain stimulator placement, chronic physical deconditioning, hospitalized a month ago for influenza A presenting with acute encephalopathy, who was brought in by EMS for evaluation of vomiting and diarrhea.   He was admitted for sepsis secondary to acute gastroenteritis vs acute diverticulosis.  He was Started on IV antibiotics and IV fluids. He reports his diarrhea has improved, denies any nausea, vomiting or abdominal pain now. His diet advanced today to regular diet. If able to tolerate regular diet without any nausea, vomiting or diarrhea, plan for discharge in the morning.      Assessment & Plan:   Sepsis secondary to acute gastroenteritis vs from acute diverticulosis.  Symptoms resolving, tolerating p.o. quite well, indicates his bowel movements have returned to normal Continue Cipro and Flagyl for next 7 days to cover any intra-abdominal infection, close follow-up with PCP in the next 2 weeks as discussed Stool is negative for c diff and GI pathogen PCR is negative.  Continue low-salt low-fat diet   H/o Parkinson's disease:  Continue home meds   Hypokalemia  Replaced.    AKI:  Improving with increased p.o. intake    Discharge Instructions  Discharge Instructions     Discharge patient   Complete by: As directed    Discharge disposition: 01-Home or Self Care   Discharge patient date:  08/11/2021      Allergies as of 08/11/2021       Reactions   Artane [trihexyphenidyl] Anxiety   Propofol Other (See Comments)   Delirium Delirium Delirium Delirium        Medication List     TAKE these medications    bacitracin 500 UNIT/GM ointment Apply 1 application topically 2 (two) times daily.   carbidopa-levodopa 25-100 MG tablet Commonly known as: SINEMET IR Take 2 tablets by mouth 4 (four) times daily.   ciprofloxacin 500 MG tablet Commonly known as: CIPRO Take 1 tablet (500 mg total) by mouth 2 (two) times daily.   donepezil 10 MG tablet Commonly known as: ARICEPT Take 10 mg by mouth daily.   Elderberry Zinc/Vit C/Immune Lozg Use as directed 1 lozenge in the mouth or throat daily.   melatonin 3 MG Tabs tablet Take 9 mg by mouth at bedtime.   metroNIDAZOLE 500 MG tablet Commonly known as: FLAGYL Take 1 tablet (500 mg total) by mouth 2 (two) times daily.   multivitamin tablet Take 1 tablet by mouth daily.   pantoprazole 40 MG tablet Commonly known as: PROTONIX Take 40 mg by mouth daily.   QUEtiapine 25 MG tablet Commonly known as: SEROQUEL Take 75 mg by mouth at bedtime.   sertraline 50 MG tablet Commonly known as: ZOLOFT Take 50 mg by mouth daily.   sildenafil 100 MG tablet Commonly known as: VIAGRA Take 100 mg by mouth daily as needed for erectile dysfunction.   tamsulosin 0.4 MG Caps capsule Commonly known as: FLOMAX Take 0.4 mg by mouth daily.   urea 40 % Crea Commonly known as: CARMOL  Apply topically daily.        Allergies  Allergen Reactions   Artane [Trihexyphenidyl] Anxiety   Propofol Other (See Comments)    Delirium Delirium Delirium Delirium    Procedures/Studies: CT Abdomen Pelvis Wo Contrast  Result Date: 08/08/2021 CLINICAL DATA:  Diarrhea, nausea vomiting. EXAM: CT ABDOMEN AND PELVIS WITHOUT CONTRAST TECHNIQUE: Multidetector CT imaging of the abdomen and pelvis was performed following the standard  protocol without IV contrast. COMPARISON:  None. FINDINGS: Evaluation of this exam is limited in the absence of intravenous contrast. Lower chest: Minimal bibasilar dependent atelectasis. The visualized lung bases are otherwise clear. There is 3 vessel coronary vascular calcification. No intra-abdominal free air or free fluid. Hepatobiliary: Small left hepatic hypodense lesion, likely a cyst. No intrahepatic biliary dilatation. The cholecystectomy. Pancreas: Unremarkable. No pancreatic ductal dilatation or surrounding inflammatory changes. Spleen: Normal in size without focal abnormality. Adrenals/Urinary Tract: The adrenal glands unremarkable. There is a 1 cm exophytic lesion from the lateral interpolar left kidney which is not characterized. There is a 7 mm nonobstructing right renal inferior pole calculus. There is no hydronephrosis on either side. The visualized ureters appear unremarkable. There is a 2.5 cm stone within the urinary bladder. Stomach/Bowel: There is postsurgical changes of the bowel with anastomotic sutures. Several fluid-filled loops of small bowel in the right hemiabdomen may be physiologic or represent mild enteritis. No bowel obstruction. Loose stool throughout the colon compatible with diarrheal state. Vascular/Lymphatic: Moderate aortoiliac atherosclerotic disease. The IVC is unremarkable. No portal venous gas. There is no adenopathy. Reproductive: The prostate and seminal vesicles are grossly unremarkable no pelvic masses Other: Small fat containing umbilical hernia. Musculoskeletal: Degenerative changes of the spine. No acute osseous pathology. IMPRESSION: 1. Diarrheal state with possible mild enteritis. No bowel obstruction. 2. A 7 mm nonobstructing right renal inferior pole calculus. No hydronephrosis. 3. A 2.5 cm urinary bladder stone. 4. Aortic Atherosclerosis (ICD10-I70.0). Electronically Signed   By: Anner Crete M.D.   On: 08/08/2021 01:23   CT Head Wo Contrast  Result  Date: 08/08/2021 CLINICAL DATA:  Encephalopathy EXAM: CT HEAD WITHOUT CONTRAST TECHNIQUE: Contiguous axial images were obtained from the base of the skull through the vertex without intravenous contrast. COMPARISON:  None. FINDINGS: Brain: There is no mass, hemorrhage or extra-axial collection. The appearance of the white matter is normal for the patient's age. There is generalized atrophy. The brain stimulator lead terminates in the region of the right subthalamic nucleus. Vascular: No abnormal hyperdensity of the major intracranial arteries or dural venous sinuses. No intracranial atherosclerosis. Skull: The visualized skull base, calvarium and extracranial soft tissues are normal. Sinuses/Orbits: No fluid levels or advanced mucosal thickening of the visualized paranasal sinuses. No mastoid or middle ear effusion. The orbits are normal. IMPRESSION: 1. No acute intracranial abnormality. 2.  Cerebral Atrophy (ICD10-G31.9). Electronically Signed   By: Ulyses Jarred M.D.   On: 08/08/2021 01:40   DG Chest Port 1 View  Result Date: 08/08/2021 CLINICAL DATA:  AMS EXAM: PORTABLE CHEST 1 VIEW COMPARISON:  07/09/2021 FINDINGS: The heart size and mediastinal contours are within normal limits. Both lungs are clear. The visualized skeletal structures are unremarkable. Deep brain stimulator generator implanted in the right chest. IMPRESSION: No active disease. Electronically Signed   By: Ulyses Jarred M.D.   On: 08/08/2021 00:34     Subjective: No acute issues or events overnight denies nausea vomiting diarrhea constipation headache fevers chills or chest pain   Discharge Exam: Vitals:   08/11/21 1139 08/11/21  1142  BP: 120/81 (!) 120/56  Pulse: 65 67  Resp: 18 17  Temp: 98.6 F (37 C) 98.3 F (36.8 C)  SpO2: 96% 97%   Vitals:   08/11/21 0338 08/11/21 0749 08/11/21 1139 08/11/21 1142  BP: (!) 153/79 (!) 168/90 120/81 (!) 120/56  Pulse: 65 (!) 51 65 67  Resp: 15 16 18 17   Temp: 98.6 F (37 C) 98.7 F  (37.1 C) 98.6 F (37 C) 98.3 F (36.8 C)  TempSrc:      SpO2:  99% 96% 97%  Weight:      Height:        General: Pt is alert, awake, not in acute distress Cardiovascular: RRR, S1/S2 +, no rubs, no gallops Respiratory: CTA bilaterally, no wheezing, no rhonchi Abdominal: Soft, NT, ND, bowel sounds + Extremities: no edema, no cyanosis    The results of significant diagnostics from this hospitalization (including imaging, microbiology, ancillary and laboratory) are listed below for reference.     Microbiology: Recent Results (from the past 240 hour(s))  Resp Panel by RT-PCR (Flu A&B, Covid) Nasopharyngeal Swab     Status: None   Collection Time: 08/08/21 12:59 AM   Specimen: Nasopharyngeal Swab; Nasopharyngeal(NP) swabs in vial transport medium  Result Value Ref Range Status   SARS Coronavirus 2 by RT PCR NEGATIVE NEGATIVE Final    Comment: (NOTE) SARS-CoV-2 target nucleic acids are NOT DETECTED.  The SARS-CoV-2 RNA is generally detectable in upper respiratory specimens during the acute phase of infection. The lowest concentration of SARS-CoV-2 viral copies this assay can detect is 138 copies/mL. A negative result does not preclude SARS-Cov-2 infection and should not be used as the sole basis for treatment or other patient management decisions. A negative result may occur with  improper specimen collection/handling, submission of specimen other than nasopharyngeal swab, presence of viral mutation(s) within the areas targeted by this assay, and inadequate number of viral copies(<138 copies/mL). A negative result must be combined with clinical observations, patient history, and epidemiological information. The expected result is Negative.  Fact Sheet for Patients:  EntrepreneurPulse.com.au  Fact Sheet for Healthcare Providers:  IncredibleEmployment.be  This test is no t yet approved or cleared by the [place] FDA and  has been  authorized for detection and/or diagnosis of SARS-CoV-2 by FDA under an Emergency Use Authorization (EUA). This EUA will remain  in effect (meaning this test can be used) for the duration of the COVID-19 declaration under Section 564(b)(1) of the Act, 21 U.S.C.section 360bbb-3(b)(1), unless the authorization is terminated  or revoked sooner.       Influenza A by PCR NEGATIVE NEGATIVE Final   Influenza B by PCR NEGATIVE NEGATIVE Final    Comment: (NOTE) The Xpert Xpress SARS-CoV-2/FLU/RSV plus assay is intended as an aid in the diagnosis of influenza from Nasopharyngeal swab specimens and should not be used as a sole basis for treatment. Nasal washings and aspirates are unacceptable for Xpert Xpress SARS-CoV-2/FLU/RSV testing.  Fact Sheet for Patients: EntrepreneurPulse.com.au  Fact Sheet for Healthcare Providers: IncredibleEmployment.be  This test is not yet approved or cleared by the [place] FDA and has been authorized for detection and/or diagnosis of SARS-CoV-2 by FDA under an Emergency Use Authorization (EUA). This EUA will remain in effect (meaning this test can be used) for the duration of the COVID-19 declaration under Section 564(b)(1) of the Act, 21 U.S.C. section 360bbb-3(b)(1), unless the authorization is terminated or revoked.  Performed at [facility], [address] [place],  [place] [address]   Culture, blood (routine x 2)     Status: None (Preliminary result)   Collection Time: 08/08/21  1:01 AM   Specimen: BLOOD  Result Value Ref Range Status   Specimen Description BLOOD RIGHT WRIST  Final   Special Requests   Final    BOTTLES DRAWN AEROBIC AND ANAEROBIC Blood Culture results may not be optimal due to an inadequate volume of blood received in culture bottles   Culture   Final    NO GROWTH 3 DAYS Performed at [facility], [address], [place], [place] [address]    Report Status PENDING   Incomplete  Culture, blood (routine x 2)     Status: None (Preliminary result)   Collection Time: 08/08/21  1:01 AM   Specimen: BLOOD  Result Value Ref Range Status   Specimen Description BLOOD RIGHT ASSIST CONTROL  Final   Special Requests   Final    BOTTLES DRAWN AEROBIC AND ANAEROBIC Blood Culture adequate volume   Culture   Final    NO GROWTH 3 DAYS Performed at [facility], [address], [place], [place] [address]    Report Status PENDING  Incomplete  C Difficile Quick Screen w PCR reflex     Status: None   Collection Time: 08/08/21  2:40 PM   Specimen: STOOL  Result Value Ref Range Status   C Diff antigen NEGATIVE NEGATIVE Final   C Diff toxin NEGATIVE NEGATIVE Final   C Diff interpretation No C. difficile detected.  Final    Comment: Performed at [facility], [address] [place], [place] [address]  Gastrointestinal Panel by PCR , Stool     Status: None   Collection Time: 08/08/21  2:40 PM   Specimen: Stool  Result Value Ref Range Status   Campylobacter species NOT DETECTED NOT DETECTED Final   Plesimonas shigelloides NOT DETECTED NOT DETECTED Final   Salmonella species NOT DETECTED NOT DETECTED Final   Yersinia enterocolitica NOT DETECTED NOT DETECTED Final   Vibrio species NOT DETECTED NOT DETECTED Final   Vibrio cholerae NOT DETECTED NOT DETECTED Final   Enteroaggregative E coli (EAEC) NOT DETECTED NOT DETECTED Final   Enteropathogenic E coli (EPEC) NOT DETECTED NOT DETECTED Final   Enterotoxigenic E coli (ETEC) NOT DETECTED NOT DETECTED Final   Shiga like toxin producing E coli (STEC) NOT DETECTED NOT DETECTED Final   Shigella/Enteroinvasive E coli (EIEC) NOT DETECTED NOT DETECTED Final   Cryptosporidium NOT DETECTED NOT DETECTED Final   Cyclospora cayetanensis NOT DETECTED NOT DETECTED Final   Entamoeba histolytica NOT DETECTED NOT DETECTED Final   Giardia lamblia NOT DETECTED NOT DETECTED Final   Adenovirus F40/41 NOT DETECTED NOT  DETECTED Final   Astrovirus NOT DETECTED NOT DETECTED Final   Norovirus GI/GII NOT DETECTED NOT DETECTED Final   Rotavirus A NOT DETECTED NOT DETECTED Final   Sapovirus (I, II, IV, and V) NOT DETECTED NOT DETECTED Final    Comment: Performed at [facility], [address] [place], [place] [address]     Labs: BNP (last 3 results) No results for input(s): BNP in the last 8760 hours. Basic Metabolic Panel: Recent Labs  Lab 08/08/21 0105 08/08/21 1120 08/09/21 0848 08/10/21 0352  NA 138 140 139 141  K 3.7 3.8 3.4* 3.8  CL 107 113* 113* 112*  CO2 21* 22 22 25   GLUCOSE 130* 119* 111* 121*  BUN 33* 29* 21 13  CREATININE 1.52* 1.29* 1.13 1.03  CALCIUM 10.0 8.6* 8.5* 8.7*  MG  --   --  2.0  --    Liver Function Tests: Recent Labs  Lab 08/08/21 0105  AST 26  ALT 24  ALKPHOS 87  BILITOT 2.4*  PROT 8.2*  ALBUMIN 4.0   Recent Labs  Lab 08/08/21 0105  LIPASE 35   No results for input(s): AMMONIA in the last 168 hours. CBC: Recent Labs  Lab 08/08/21 0105 08/08/21 1120 08/09/21 0848 08/10/21 0352  WBC 20.4* 14.1* 11.7* 12.7*  NEUTROABS 17.0*  --   --   --   HGB 14.4 11.9* 11.6* 11.7*  HCT 43.1 35.4* 34.9* 34.8*  MCV 91.3 89.6 89.3 89.0  PLT 200 172 160 156   Cardiac Enzymes: No results for input(s): CKTOTAL, CKMB, CKMBINDEX, TROPONINI in the last 168 hours. BNP: Invalid input(s): POCBNP CBG: No results for input(s): GLUCAP in the last 168 hours. D-Dimer No results for input(s): DDIMER in the last 72 hours. Hgb A1c No results for input(s): HGBA1C in the last 72 hours. Lipid Profile No results for input(s): CHOL, HDL, LDLCALC, TRIG, CHOLHDL, LDLDIRECT in the last 72 hours. Thyroid function studies No results for input(s): TSH, T4TOTAL, T3FREE, THYROIDAB in the last 72 hours.  Invalid input(s): FREET3 Anemia work up No results for input(s): VITAMINB12, FOLATE, FERRITIN, TIBC, IRON, RETICCTPCT in the last 72 hours. Urinalysis    Component Value  Date/Time   COLORURINE YELLOW 07/09/2021 1928   APPEARANCEUR CLEAR 07/09/2021 1928   LABSPEC 1.010 07/09/2021 1928   PHURINE 5.5 07/09/2021 1928   GLUCOSEU NEGATIVE 07/09/2021 1928   HGBUR NEGATIVE 07/09/2021 [place] NEGATIVE 07/09/2021 1928   KETONESUR TRACE (A) 07/09/2021 1928   PROTEINUR NEGATIVE 07/09/2021 1928   NITRITE NEGATIVE 07/09/2021 1928   LEUKOCYTESUR NEGATIVE 07/09/2021 1928   Sepsis Labs Invalid input(s): PROCALCITONIN,  WBC,  LACTICIDVEN Microbiology Recent Results (from the past 240 hour(s))  Resp Panel by RT-PCR (Flu A&B, Covid) Nasopharyngeal Swab     Status: None   Collection Time: 08/08/21 12:59 AM   Specimen: Nasopharyngeal Swab; Nasopharyngeal(NP) swabs in vial transport medium  Result Value Ref Range Status   SARS Coronavirus 2 by RT PCR NEGATIVE NEGATIVE Final    Comment: (NOTE) SARS-CoV-2 target nucleic acids are NOT DETECTED.  The SARS-CoV-2 RNA is generally detectable in upper respiratory specimens during the acute phase of infection. The lowest concentration of SARS-CoV-2 viral copies this assay can detect is 138 copies/mL. A negative result does not preclude SARS-Cov-2 infection and should not be used as the sole basis for treatment or other patient management decisions. A negative result may occur with  improper specimen collection/handling, submission of specimen other than nasopharyngeal swab, presence of viral mutation(s) within the areas targeted by this assay, and inadequate number of viral copies(<138 copies/mL). A negative result must be combined with clinical observations, patient history, and epidemiological information. The expected result is Negative.  Fact Sheet for Patients:  EntrepreneurPulse.com.au  Fact Sheet for Healthcare Providers:  IncredibleEmployment.be  This test is no t yet approved or cleared by the [place] FDA and  has been authorized for detection and/or diagnosis  of SARS-CoV-2 by FDA under an Emergency Use Authorization (EUA). This EUA will remain  in effect (meaning this test can be used) for the duration of the COVID-19 declaration under Section 564(b)(1) of the Act, 21 U.S.C.section 360bbb-3(b)(1), unless the authorization is terminated  or revoked sooner.       Influenza A by PCR NEGATIVE NEGATIVE  Final   Influenza B by PCR NEGATIVE NEGATIVE Final    Comment: (NOTE) The Xpert Xpress SARS-CoV-2/FLU/RSV plus assay is intended as an aid in the diagnosis of influenza from Nasopharyngeal swab specimens and should not be used as a sole basis for treatment. Nasal washings and aspirates are unacceptable for Xpert Xpress SARS-CoV-2/FLU/RSV testing.  Fact Sheet for Patients: EntrepreneurPulse.com.au  Fact Sheet for Healthcare Providers: IncredibleEmployment.be  This test is not yet approved or cleared by the [place] FDA and has been authorized for detection and/or diagnosis of SARS-CoV-2 by FDA under an Emergency Use Authorization (EUA). This EUA will remain in effect (meaning this test can be used) for the duration of the COVID-19 declaration under Section 564(b)(1) of the Act, 21 U.S.C. section 360bbb-3(b)(1), unless the authorization is terminated or revoked.  Performed at [facility], [address] [place], [place] [address]   Culture, blood (routine x 2)     Status: None (Preliminary result)   Collection Time: 08/08/21  1:01 AM   Specimen: BLOOD  Result Value Ref Range Status   Specimen Description BLOOD RIGHT WRIST  Final   Special Requests   Final    BOTTLES DRAWN AEROBIC AND ANAEROBIC Blood Culture results may not be optimal due to an inadequate volume of blood received in culture bottles   Culture   Final    NO GROWTH 3 DAYS Performed at [facility], [address], [place], [place] [address]    Report Status PENDING  Incomplete  Culture, blood (routine x 2)      Status: None (Preliminary result)   Collection Time: 08/08/21  1:01 AM   Specimen: BLOOD  Result Value Ref Range Status   Specimen Description BLOOD RIGHT ASSIST CONTROL  Final   Special Requests   Final    BOTTLES DRAWN AEROBIC AND ANAEROBIC Blood Culture adequate volume   Culture   Final    NO GROWTH 3 DAYS Performed at [facility], [address], [place], [place] [address]    Report Status PENDING  Incomplete  C Difficile Quick Screen w PCR reflex     Status: None   Collection Time: 08/08/21  2:40 PM   Specimen: STOOL  Result Value Ref Range Status   C Diff antigen NEGATIVE NEGATIVE Final   C Diff toxin NEGATIVE NEGATIVE Final   C Diff interpretation No C. difficile detected.  Final    Comment: Performed at [facility], [address] [place], [place] [address]  Gastrointestinal Panel by PCR , Stool     Status: None   Collection Time: 08/08/21  2:40 PM   Specimen: Stool  Result Value Ref Range Status   Campylobacter species NOT DETECTED NOT DETECTED Final   Plesimonas shigelloides NOT DETECTED NOT DETECTED Final   Salmonella species NOT DETECTED NOT DETECTED Final   Yersinia enterocolitica NOT DETECTED NOT DETECTED Final   Vibrio species NOT DETECTED NOT DETECTED Final   Vibrio cholerae NOT DETECTED NOT DETECTED Final   Enteroaggregative E coli (EAEC) NOT DETECTED NOT DETECTED Final   Enteropathogenic E coli (EPEC) NOT DETECTED NOT DETECTED Final   Enterotoxigenic E coli (ETEC) NOT DETECTED NOT DETECTED Final   Shiga like toxin producing E coli (STEC) NOT DETECTED NOT DETECTED Final   Shigella/Enteroinvasive E coli (EIEC) NOT DETECTED NOT DETECTED Final   Cryptosporidium NOT DETECTED NOT DETECTED Final   Cyclospora cayetanensis NOT DETECTED NOT DETECTED Final   Entamoeba histolytica NOT DETECTED NOT DETECTED Final  Giardia lamblia NOT DETECTED NOT DETECTED Final   Adenovirus F40/41 NOT DETECTED NOT DETECTED Final   Astrovirus NOT DETECTED NOT  DETECTED Final   Norovirus GI/GII NOT DETECTED NOT DETECTED Final   Rotavirus A NOT DETECTED NOT DETECTED Final   Sapovirus (I, II, IV, and V) NOT DETECTED NOT DETECTED Final    Comment: Performed at [facility], [address] [place], [place] [address]     Time coordinating discharge: Over 30 minutes  SIGNED:   Little Ishikawa, DO [facility] 08/11/2021, 12:39 PM Pager   If 7PM-7AM, please contact night-coverage [email or web address]

## 2021-08-11 NOTE — Care Management Important Message (Signed)
Important Message  Patient Details  Name: Wesley Anderson. MRN: [id number] Date of Birth: 13-Feb-1946   Medicare Important Message Given:  Yes     Juliann Pulse A Matheu Ploeger 08/11/2021, 10:42 AM

## 2021-08-11 NOTE — TOC Transition Note (Signed)
Transition of Care [facility]) - CM/SW Discharge Note   Patient Details  Name: Wesley Anderson. MRN: [id number] Date of Birth: Dec 20, 1945  Transition of Care [facility] CM/SW Contact:  Candie Chroman, LCSW Phone Number: 08/11/2021, 12:36 PM   Clinical Narrative:   Patient has orders to discharge home today. Left message for [employer] representative to notify. No further concerns. CSW signing off.  Final next level of care: [place] Barriers to Discharge: Barriers Resolved   Patient Goals and CMS Choice     Choice offered to / list presented to : NA  Discharge Placement                    Patient and family notified of of transfer: 08/11/21  Discharge Plan and Services     Post Acute Care Choice: Resumption of Svcs/PTA Provider                    HH Arranged: RN, PT, OT [facility] Agency: [place] Date [place]: 08/11/21   Representative spoke with at [facility]: Left message for [place] Ortman. Notified him yesterday about plan for discharge today.  Social Determinants of Health (SDOH) Interventions     Readmission Risk Interventions Readmission Risk Prevention Plan 08/09/2021  Transportation Screening Complete  PCP or Specialist Appt within 3-5 Days Complete  HRI or [place] Complete  Social Work Consult for [employer] Planning/Counseling Complete  Palliative Care Screening Not Applicable  Medication Review Press photographer) Complete  Some recent data might be hidden

## 2021-08-11 NOTE — Progress Notes (Signed)
After revieweing the patient's chart, I spoke with attending Dr. Avon Gully. I aske dhow PMT could be most effective for patient since he is scheduled for d/c today. Dr. Avon Gully shared that the patient has no PMT needs at this time and consult can be discontinued.  PMT will remain available for reconsult for patient if/when appropriate.  NO CHARGE  Mehgan Santmyer L. Ilsa Iha, FNP-BC Palliative Medicine Team Team Phone # [phone]

## 2021-08-11 NOTE — Plan of Care (Signed)
Patient discharged home per MD orders at this time.All discharge instructions,education and medications reviewed with the patient and wife at the bedside.patient expressed understanding and will comply with dc instructions.follow up appointments was also communicated to the patient.no verbal c/o or any ssx of distress.patient was discharged home with HH/PT/OT services per order.patient was transported home by spouse in a privately owned vehicle.

## 2021-08-13 LAB — CULTURE, BLOOD (ROUTINE X 2)
Culture: NO GROWTH
Culture: NO GROWTH
Special Requests: ADEQUATE

## 2021-12-09 ENCOUNTER — Ambulatory Visit: Payer: Medicare HMO

## 2021-12-27 ENCOUNTER — Ambulatory Visit: Payer: Medicare HMO | Attending: Internal Medicine

## 2021-12-27 DIAGNOSIS — R293 Abnormal posture: Secondary | ICD-10-CM | POA: Insufficient documentation

## 2021-12-27 DIAGNOSIS — R262 Difficulty in walking, not elsewhere classified: Secondary | ICD-10-CM | POA: Insufficient documentation

## 2021-12-27 DIAGNOSIS — R42 Dizziness and giddiness: Secondary | ICD-10-CM | POA: Insufficient documentation

## 2021-12-27 NOTE — Therapy (Signed)
[facility] ?[address] ?[place], [place], [address] ?Phone: [phone]   Fax:  [phone] ? ?Physical Therapy Evaluation ? ?Patient Details  ?Name: Wesley Anderson. ?MRN: [id number] ?Date of Birth: 04-Apr-1946 ?Referring Provider (PT): Ramonita Lab III ? ? ?Encounter Date: 12/27/2021 ? ? PT End of Session - 12/27/21 2112   ? ? Visit Number 1   ? Number of Visits 24   ? Date for PT Re-Evaluation 03/21/22   ? Authorization Type Aetna Medicare   ? Authorization Time Period 12/27/21-03/21/22   ? Progress Note Due on Visit 10   ? PT Start Time 1552   ? PT Stop Time 1630   ? PT Time Calculation (min) 38 min   ? Equipment Utilized During Treatment Gait belt   ? Activity Tolerance Patient tolerated treatment well;No increased pain   ? Behavior During Therapy Flat affect;WFL for tasks assessed/performed   ? ?  ?  ? ?  ? ? ?Past Medical History:  ?Diagnosis Date  ? AAA (abdominal aortic aneurysm)   ? Alcohol dependence in remission [facility])   ? Cancer [facility])   ? skin   ? Colon polyps   ? ED (erectile dysfunction)   ? GERD (gastroesophageal reflux disease)   ? Hematest positive stools   ? Melanoma of skin ([place])   ? Parkinson's disease ([place])   ? Renal cyst, right   ? Restless leg syndrome   ? ? ?Past Surgical History:  ?Procedure Laterality Date  ? CHOLECYSTECTOMY    ? COLON SURGERY    ? colectomy with anastamosis  ? COLONOSCOPY WITH PROPOFOL N/A 11/07/2016  ? Procedure: COLONOSCOPY WITH PROPOFOL;  Surgeon: Lollie Sails, MD;  Location: [facility] ENDOSCOPY;  Service: Endoscopy;  Laterality: N/A;  ? COLONOSCOPY WITH PROPOFOL N/A 09/06/2018  ? Procedure: COLONOSCOPY WITH PROPOFOL;  Surgeon: Lollie Sails, MD;  Location: [facility] ENDOSCOPY;  Service: Endoscopy;  Laterality: N/A;  ? COLONOSCOPY WITH PROPOFOL N/A 05/16/2019  ? Procedure: COLONOSCOPY WITH PROPOFOL;  Surgeon: Lollie Sails, MD;  Location: [facility] ENDOSCOPY;  Service: Endoscopy;  Laterality: N/A;  ? CYST  EXCISION    ? vocal cord  ? DBS placement    ? deep brain implant    ? ESOPHAGOGASTRODUODENOSCOPY (EGD) WITH PROPOFOL N/A 09/06/2018  ? Procedure: ESOPHAGOGASTRODUODENOSCOPY (EGD) WITH PROPOFOL;  Surgeon: Lollie Sails, MD;  Location: [facility] ENDOSCOPY;  Service: Endoscopy;  Laterality: N/A;  ? MELANOMA EXCISION    ? TONSILLECTOMY    ? ? ?There were no vitals filed for this visit. ? ? ? Subjective Assessment - 12/27/21 2055   ? ? Subjective Buster Coiner is a 72yoM who is referred to [facility] OPPT for on-going imbalance in the setting of Parkinson's Disease. Pt was an active patient here in Nov 2022, but DC due to hospital admission for Flu in November and another due to encephalopathy in December. Pt working with HHPT/OT in the interim.  Pt reports no falls in the past several months, had made efforts to be as safe as possible.   ? Pertinent History Wesley Anderson is a 78yoM who is referred to [facility] OPPT for on-going imbalance in the setting of Parkinson's Disease. Pt was an active patient here in Nov 2022, but DC due to hospital admission for Flu in November and another due to encephalopathy in December. Pt working with HHPT/OT in the interim.  Pt reports no falls in the past several months, had made efforts to  be as safe as possible.   ? Patient Stated Goals Wants to work on balance so that he does not fall. He wants to be steady enough to play golf.   ? Currently in Pain? No/denies   ? ?  ?  ? ?  ? ? ? ? ? [facility] PT Assessment - 12/27/21 0001   ? ?  ? Assessment  ? Medical Diagnosis Unsteadiness on feet, history of falls   Parkinson's Disease  ? Referring Provider (PT) Ramonita Lab III   ? Onset Date/Surgical Date --   on going  ? Prior Therapy yes   ?  ? Precautions  ? Precautions Fall   ?  ? Balance Screen  ? Has the patient fallen in the past 6 months Yes   ? How many times? 2   ? Has the patient had a decrease in activity level because of a fear of falling?  No   ? Is the patient reluctant to leave their home  because of a fear of falling?  No   ?  ? Home Environment  ? Living Environment Private residence   ? Living Arrangements Spouse/significant other   ? Type of Home House   ? Home Access Stairs to enter   ? Entrance Stairs-Number of Steps 4   ? Entrance Stairs-Rails Left;Right   ? Home Layout One level   ? [place] - 2 wheels;Wheelchair - manual   2 different RW; WC has elevated foot rests  ?  ? Prior Function  ? Level of Independence Independent   ? Vocation Retired   ? Leisure He and wife go out for lunch most days   ?  ? Cognition  ? Overall Cognitive Status Within Functional Limits for tasks assessed   ?  ? Observation/Other Assessments  ? Focus on Therapeutic Outcomes (FOTO)  48   ?  ? Transfers  ? Five time sit to stand comments  14.48 sec pushing off chair arms   15.10sec chair +airex (hands free)  ?  ? Ambulation/Gait  ? Ambulation Distance (Feet) 300 Feet   ? Assistive device Rolling walker   ? Gait velocity 0.38ms   2:39"  ? ?  ?  ? ?  ? ? ? ? ? ? ? ? ? Vestibular Assessment - 12/27/21 0001   ? ?  ? Orthostatics  ? BP sitting 93/58    ? HR sitting 57   ? BP standing (after 1 minute) 78/56    ? HR standing (after 1 minute) 72   ? BP standing (after 3 minutes) 74/41    ? HR standing (after 3 minutes) 246   (articfact reading from UE parkinsonian tremor increase)  ? ?  ?  ? ?  ? ? ? ? ? ?Objective measurements completed on examination: See above findings.  ? ? ? ? ? ? ? ? ? ? ? ? ? ? PT Education - 12/27/21 2112   ? ? Education Details Safety with orthostatic BP in parkinsonism   ? Person(s) Educated Patient   ? Methods Explanation;Demonstration   ? Comprehension Verbalized understanding;Need further instruction   ? ?  ?  ? ?  ? ? ? PT Short Term Goals - 12/27/21 2116   ? ?  ? PT SHORT TERM GOAL #1  ? Title Patient will demonstrate independence with home exercise program for ability to self treat.   ? Baseline pending   ? Time 2   ? Period Weeks   ?  Status New   ? Target Date 01/24/22   ?  ? PT  SHORT TERM GOAL #2  ? Title 6MWT c LRAD >810f -> (+) IADL   ? Baseline 3039feval   ? Time 4   ? Period Weeks   ? Status New   ? Target Date 01/24/22   ?  ? PT SHORT TERM GOAL #3  ? Title FOTO>10% increase   ? Baseline Eval: 48   ? Time 5   ? Period Weeks   ? Status New   ? Target Date 01/31/22   ?  ? PT SHORT TERM GOAL #4  ? Title 5xSTS<13sec s UE chair + airex pad.   ? Baseline Eval 15sec   ? Time 6   ? Period Weeks   ? Status New   ? Target Date 02/07/22   ? ?  ?  ? ?  ? ? ? ? PT Long Term Goals - 12/27/21 2119   ? ?  ? PT LONG TERM GOAL #1  ? Title Patient will have improved function and activity level as evidenced by an increase in FOTO score by 13 points or more.   ? Baseline Eval 48   ? Time 7   ? Period Weeks   ? Status New   ? Target Date 02/14/22   ?  ? PT LONG TERM GOAL #2  ? Title 6MWT >12009f LRAD   ? Baseline Eval: 300f32f 2m4371m ? Time 8   ? Period Weeks   ? Status New   ? Target Date 02/21/22   ?  ? PT LONG TERM GOAL #3  ? Title Patient will improve Mini-BESTest score >=20 decrease risk of falling and demonstrate a statistically significant improvement in his balance.   ? Baseline Pending visit 2   ? Time 9   ? Period Weeks   ? Status New   ? Target Date 02/28/22   ?  ? PT LONG TERM GOAL #4  ? Title 5xSTS<15sec from chair, s BUE   ? Baseline eval: requires arms from chair height   ? Time 10   ? Period Weeks   ? Status New   ? Target Date 03/21/22   ? ?  ?  ? ?  ? ? ? ? ? ? ? ? ? Plan - 12/27/21 2113   ? ? Clinical Impression Statement Pt presents for evalaution of imbalance s/p 2 hospitalizations. Exam reveals weakness, postural righting abnormality, unsteadiness in gait. Pt wil benefit from PT to maximize safety and tolerance for basic movements to reduce falls risk and maintain independence.   ? Personal Factors and Comorbidities Age;Past/Current Experience   ? Comorbidities PD, H/o cancer,   ? Examination-Activity Limitations Squat;Stairs;Stand;Locomotion Level;Reach Overhead   ?  Examination-Participation Restrictions Cleaning;Medication Management;Interpersonal Relationship;Community Activity   ? Stability/Clinical Decision Making Evolving/Moderate complexity   ? Clinical Decision Making Low   ? Re

## 2021-12-29 ENCOUNTER — Ambulatory Visit: Payer: Medicare HMO

## 2021-12-29 DIAGNOSIS — R293 Abnormal posture: Secondary | ICD-10-CM

## 2021-12-29 DIAGNOSIS — R262 Difficulty in walking, not elsewhere classified: Secondary | ICD-10-CM | POA: Diagnosis not present

## 2021-12-29 DIAGNOSIS — R42 Dizziness and giddiness: Secondary | ICD-10-CM

## 2021-12-29 NOTE — Therapy (Signed)
?OUTPATIENT PHYSICAL THERAPY TREATMENT NOTE ? ? ?Patient Name: Wesley Anderson. ?MRN: [id number] ?DOB:Mar 17, 1946, 76 y.o., male ?Today's Date: 12/29/2021 ? ?PCP: Wesley Monte, MD ?REFERRING PROVIDER: Ramonita Lab III, MD ? ? PT End of Session - 12/29/21 1503   ? ? Visit Number 2   ? Number of Visits 24   ? Date for PT Re-Evaluation 03/21/22   ? Authorization Type Aetna Medicare   ? Authorization Time Period 12/27/21-03/21/22   ? Progress Note Due on Visit 10   ? PT Start Time 1503   ? PT Stop Time 1543   ? PT Time Calculation (min) 40 min   ? Equipment Utilized During Treatment Gait belt   ? Activity Tolerance Patient tolerated treatment well;No increased pain   ? Behavior During Therapy Flat affect;WFL for tasks assessed/performed   ? ?  ?  ? ?  ? ? ?Past Medical History:  ?Diagnosis Date  ? AAA (abdominal aortic aneurysm)   ? Alcohol dependence in remission [facility])   ? Cancer [facility])   ? skin   ? Colon polyps   ? ED (erectile dysfunction)   ? GERD (gastroesophageal reflux disease)   ? Hematest positive stools   ? Melanoma of skin ([place])   ? Parkinson's disease ([place])   ? Renal cyst, right   ? Restless leg syndrome   ? ?Past Surgical History:  ?Procedure Laterality Date  ? CHOLECYSTECTOMY    ? COLON SURGERY    ? colectomy with anastamosis  ? COLONOSCOPY WITH PROPOFOL N/A 11/07/2016  ? Procedure: COLONOSCOPY WITH PROPOFOL;  Surgeon: Wesley Sails, MD;  Location: [facility] ENDOSCOPY;  Service: Endoscopy;  Laterality: N/A;  ? COLONOSCOPY WITH PROPOFOL N/A 09/06/2018  ? Procedure: COLONOSCOPY WITH PROPOFOL;  Surgeon: Wesley Sails, MD;  Location: [facility] ENDOSCOPY;  Service: Endoscopy;  Laterality: N/A;  ? COLONOSCOPY WITH PROPOFOL N/A 05/16/2019  ? Procedure: COLONOSCOPY WITH PROPOFOL;  Surgeon: Wesley Sails, MD;  Location: [facility] ENDOSCOPY;  Service: Endoscopy;  Laterality: N/A;  ? CYST EXCISION    ? vocal cord  ? DBS placement    ? deep brain implant    ? ESOPHAGOGASTRODUODENOSCOPY (EGD) WITH PROPOFOL N/A 09/06/2018  ?  Procedure: ESOPHAGOGASTRODUODENOSCOPY (EGD) WITH PROPOFOL;  Surgeon: Wesley Sails, MD;  Location: [facility] ENDOSCOPY;  Service: Endoscopy;  Laterality: N/A;  ? MELANOMA EXCISION    ? TONSILLECTOMY    ? ?Patient Active Problem List  ? Diagnosis Date Noted  ? Acute gastroenteritis 08/08/2021  ? Sepsis ([place]) 08/08/2021  ? Confusion   ? Anemia 07/09/2021  ? Weakness 07/09/2021  ? Fever and chills 07/09/2021  ? Myalgia 07/09/2021  ? Influenza A 07/09/2021  ? Acute metabolic encephalopathy 41/93/7902  ? AKI (acute kidney injury) ([place]) 07/09/2020  ? Leukocytosis 07/09/2020  ? S/P deep brain stimulator placement 07/09/2020  ? Squamous cell carcinoma, 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 04/24/2020  ? Heme positive stool 05/27/2016  ? Alcohol dependence in remission ([place]) 07/31/2013  ? Cognitive impairment 02/22/2013  ? History of malignant melanoma of skin 05/15/2012  ? Parkinson's disease ([employer]) 09/28/2011  ? ? ?REFERRING DIAG: Unsteadiness on feet, history of falls ? ?THERAPY DIAG:  ?Difficulty in walking, not elsewhere classified ? ?Abnormal posture ? ?Dizziness and giddiness ? ?PERTINENT HISTORY: Wesley Anderson is a 65yoM who is referred to [facility] OPPT for on-going imbalance in the setting of Parkinson's Disease. Pt was an active patient here in Nov 2022, but DC due to hospital admission for Flu in November  and another due to encephalopathy in December. Pt working with HHPT/OT in the interim. Pt reports no falls in the past several months, had made efforts to be as safe as possible ? ?PRECAUTIONS: Fall risk ? ?SUBJECTIVE: Doing good. No pain or discomfort anywhere. Has has Parkinson's for 20 years.  ? ?PAIN:  ?Are you having pain? No ? ? ? ? ?TODAY'S TREATMENT:  ?Neuromuscular re-education ? ?CGA with standing tasks ? ?Sit <> stand from regular chair with arms, B UE assist one time, no UE assist 4x ? (5x total) ? ?Static standing, feet slightly > shoulder width apart eyes closed 20 seconds x 3 CGA. No LOB ? - feet  together, eyes open 20 seconds. CGA, no LOB ?   - eyes closed 20 seconds CGA, no LOB, increased sway.  ? ?SLS with  ? contralateral UE assist ?  R 10x5 seconds ?  L 10x5 seconds ? ? No UE assist but with occasional light touch assist  ?  R 10x5 seconds  ?  L 10x5 seconds ?  Unsteady.  ? ?Standing with B UE assist  ? Hip abduction  ?  R 10x2 with 5 second holds ?  L 10x2 with 5 second holds ? ? ?Stepping over shoe box with one UE assist, cues for placing and maintaining his center of gravity over base of support.  ? R 8x2 ? L 8x2 ?  ? Able to perform about 2x each side without UE assist. More difficulty during second set, LOB 2x with min PT assist. Freezing at times during second set.  ? ? ?  ? ?Improved exercise technique, movement at target joints, use of target muscles after mod verbal, visual, tactile cues.  ? ?Response to treatment ?Pt tolerated session well without aggravation of symptoms.  ? ? ? ?Clinical impression ? ?Worked on improving ability to place and maintain his center of gravity over his base of support. Worked on improving glute med strength to help achieve as well. Improved ability to perform sit <> stand transfers after session. Able to maintain static balance with eyes open and eyes closed with wide and narrow stance after cues for technique. Pt tolerated session well without aggravation of symptoms. Pt will benefit from continued skilled physical therapy services to improve strength, balance, function, and decrease fall risk.  ? ? ? ? ? ? ?PATIENT EDUCATION: ?Education details: ther-ex, HEP ?Person educated: Patient ?Education method: Explanation, Demonstration, Tactile cues, Verbal cues, and Handouts ?Education comprehension: verbalized understanding and returned demonstration ? ? ?HOME EXERCISE PROGRAM: ?Access Code: [email or web address] ?URL: [email or web address] ?Date: 12/29/2021 ?Prepared by: Joneen Boers ? ?Exercises ?- Standing Hip Abduction with Counter Support  - 1 x daily - 7 x  weekly - 2 sets - 10 reps - 5 seconds hold ?- Sit to Stand with Counter Support  - 1 x daily - 7 x weekly - 2 sets - 5 reps ? ? PT Short Term Goals - 12/27/21 2116   ? ?  ? PT SHORT TERM GOAL #1  ? Title Patient will demonstrate independence with home exercise program for ability to self treat.   ? Baseline pending   ? Time 2   ? Period Weeks   ? Status New   ? Target Date 01/24/22   ?  ? PT SHORT TERM GOAL #2  ? Title 6MWT c LRAD >828f -> (+) IADL   ? Baseline 3075feval   ? Time 4   ? Period Weeks   ? Status  New   ? Target Date 01/24/22   ?  ? PT SHORT TERM GOAL #3  ? Title FOTO>10% increase   ? Baseline Eval: 48   ? Time 5   ? Period Weeks   ? Status New   ? Target Date 01/31/22   ?  ? PT SHORT TERM GOAL #4  ? Title 5xSTS<13sec s UE chair + airex pad.   ? Baseline Eval 15sec   ? Time 6   ? Period Weeks   ? Status New   ? Target Date 02/07/22   ? ?  ?  ? ?  ? ? ? PT Long Term Goals - 12/27/21 2119   ? ?  ? PT LONG TERM GOAL #1  ? Title Patient will have improved function and activity level as evidenced by an increase in FOTO score by 13 points or more.   ? Baseline Eval 48   ? Time 7   ? Period Weeks   ? Status New   ? Target Date 02/14/22   ?  ? PT LONG TERM GOAL #2  ? Title 6MWT >128f c LRAD   ? Baseline Eval: 3074fin 45m20m   ? Time 8   ? Period Weeks   ? Status New   ? Target Date 02/21/22   ?  ? PT LONG TERM GOAL #3  ? Title Patient will improve Mini-BESTest score >=20 decrease risk of falling and demonstrate a statistically significant improvement in his balance.   ? Baseline Pending visit 2   ? Time 9   ? Period Weeks   ? Status New   ? Target Date 02/28/22   ?  ? PT LONG TERM GOAL #4  ? Title 5xSTS<15sec from chair, s BUE   ? Baseline eval: requires arms from chair height   ? Time 10   ? Period Weeks   ? Status New   ? Target Date 03/21/22   ? ?  ?  ? ?  ? ? ? Plan - 12/29/21 1640   ? ? Clinical Impression Statement Worked on improving ability to place and maintain his center of gravity over his base of  support. Worked on improving glute med strength to help achieve as well. Improved ability to perform sit <> stand transfers after session. Able to maintain static balance with eyes open and eyes closed with wide

## 2022-01-03 ENCOUNTER — Ambulatory Visit: Payer: Medicare HMO

## 2022-01-03 DIAGNOSIS — R262 Difficulty in walking, not elsewhere classified: Secondary | ICD-10-CM

## 2022-01-03 DIAGNOSIS — R293 Abnormal posture: Secondary | ICD-10-CM

## 2022-01-03 NOTE — Therapy (Signed)
OUTPATIENT PHYSICAL THERAPY TREATMENT NOTE   Patient Name: Wesley Anderson. MRN: [id number] DOB:February 15, 1946, 76 y.o., male Today's Date: 01/03/2022  PCP: Emerson Monte, MD REFERRING PROVIDER: Ramonita Lab III, MD   PT End of Session - 01/03/22 1549     Visit Number 3    Number of Visits 24    Date for PT Re-Evaluation 03/21/22    Authorization Type [employer]    Authorization Time Period 12/27/21-03/21/22    Progress Note Due on Visit 10    PT Start Time 1549    PT Stop Time 1630    PT Time Calculation (min) 41 min    Equipment Utilized During Treatment Gait belt    Activity Tolerance Patient tolerated treatment well;No increased pain    Behavior During Therapy Flat affect;WFL for tasks assessed/performed              Past Medical History:  Diagnosis Date   AAA (abdominal aortic aneurysm)    Alcohol dependence in remission ([place])    Cancer ([place])    skin    Colon polyps    ED (erectile dysfunction)    GERD (gastroesophageal reflux disease)    Hematest positive stools    Melanoma of skin (H[facility])    Parkinson's disease ([place])    Renal cyst, right    Restless leg syndrome    Past Surgical History:  Procedure Laterality Date   CHOLECYSTECTOMY     COLON SURGERY     colectomy with anastamosis   COLONOSCOPY WITH PROPOFOL N/A 11/07/2016   Procedure: COLONOSCOPY WITH PROPOFOL;  Surgeon: Lollie Sails, MD;  Location: [facility] ENDOSCOPY;  Service: Endoscopy;  Laterality: N/A;   COLONOSCOPY WITH PROPOFOL N/A 09/06/2018   Procedure: COLONOSCOPY WITH PROPOFOL;  Surgeon: Lollie Sails, MD;  Location: [facility] ENDOSCOPY;  Service: Endoscopy;  Laterality: N/A;   COLONOSCOPY WITH PROPOFOL N/A 05/16/2019   Procedure: COLONOSCOPY WITH PROPOFOL;  Surgeon: Lollie Sails, MD;  Location: [facility] ENDOSCOPY;  Service: Endoscopy;  Laterality: N/A;   CYST EXCISION     vocal cord   DBS placement     deep brain implant     ESOPHAGOGASTRODUODENOSCOPY (EGD) WITH PROPOFOL N/A 09/06/2018    Procedure: ESOPHAGOGASTRODUODENOSCOPY (EGD) WITH PROPOFOL;  Surgeon: Lollie Sails, MD;  Location: [facility] ENDOSCOPY;  Service: Endoscopy;  Laterality: N/A;   MELANOMA EXCISION     TONSILLECTOMY     Patient Active Problem List   Diagnosis Date Noted   Acute gastroenteritis 08/08/2021   Sepsis ([place]) 08/08/2021   Confusion    Anemia 07/09/2021   Weakness 07/09/2021   Fever and chills 07/09/2021   Myalgia 07/09/2021   Influenza A 63/84/6659   Acute metabolic encephalopathy 93/57/0177   AKI (acute kidney injury) ([place]) 07/09/2020   Leukocytosis 07/09/2020   S/P deep brain stimulator placement 07/09/2020   Squamous cell carcinoma, 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 04/24/2020   Heme positive stool 05/27/2016   Alcohol dependence in remission ([place]) 07/31/2013   Cognitive impairment 02/22/2013   History of malignant melanoma of skin 05/15/2012   Parkinson's disease ([place]) 09/28/2011    REFERRING DIAG: Unsteadiness on feet, history of falls  THERAPY DIAG:  Difficulty in walking, not elsewhere classified  Abnormal posture  PERTINENT HISTORY: Deanglo Hissong is a 76yoM who is referred to [facility] OPPT for on-going imbalance in the setting of Parkinson's Disease. Pt was an active patient here in Nov 2022, but DC due to hospital admission for Flu in November and another due  to encephalopathy in December. Pt working with HHPT/OT in the interim. Pt reports no falls in the past several months, had made efforts to be as safe as possible  PRECAUTIONS: Fall risk  SUBJECTIVE: Doing not bad. No pain or discomfort anywhere.   PAIN:  Are you having pain? No     TODAY'S TREATMENT:  Neuromuscular re-education  CGA with standing tasks  Gait with rw, cues for increasing step length 40 ft, then 170 ft  Directed patient with gait with normal gait speed, with changes in speed, 180 degree pivot turn, with R and L cervical rotation position, with cervical flexion and extension position, stepping  around obstacles, stepping over an obstacle, ascending and descending 4 regular steps with UE assist   Turning 360 degrees to the R and to the L 2x with rw, CGA, min A to perform properly. Cues for larger steps   Sit <> stand from regular chair with arms, B UE assist 5x  Then no UE assist 5x  Standing hip flexion, alternating 10x2 each LE, large movements with B UE assist  Standing with B UE assist   Hip abduction, alternating, emphasis on large movements 10x3   SLS with   contralateral UE assist   R 10x5 seconds   L 10x5 seconds  Static standing with upright posture, scapular retraction, feet at least shoulder width apart 20 seconds x 2    Improved exercise technique, movement at target joints, use of target muscles after mod verbal, visual, tactile cues.   Response to treatment Pt tolerated session well without aggravation of symptoms.     Clinical impression  Worked on increasing stride length, as well as amplitude of movement to decrease festinating gait pattern and decrease risk for falls. Pt tolerated session well without aggravation of symptoms. Pt will benefit from continued skilled physical therapy services to improve strength, balance, function, and decrease fall risk.        PATIENT EDUCATION: Education details: ther-ex, HEP Person educated: Patient Education method: Explanation, Demonstration, Tactile cues, Verbal cues, and Handouts Education comprehension: verbalized understanding and returned demonstration   HOME EXERCISE PROGRAM: Access Code: [email or web address] URL: [email or web address] Date: 12/29/2021 Prepared by: Joneen Boers  Exercises - Standing Hip Abduction with Counter Support  - 1 x daily - 7 x weekly - 2 sets - 10 reps - 5 seconds hold - Sit to Stand with Counter Support  - 1 x daily - 7 x weekly - 2 sets - 5 reps      PT Short Term Goals - 12/27/21 2116       PT SHORT TERM GOAL #1   Title Patient will demonstrate  independence with home exercise program for ability to self treat.    Baseline pending    Time 2    Period Weeks    Status New    Target Date 01/24/22      PT SHORT TERM GOAL #2   Title 6MWT c LRAD >87f -> (+) IADL    Baseline 301feval    Time 4    Period Weeks    Status New    Target Date 01/24/22      PT SHORT TERM GOAL #3   Title FOTO>10% increase    Baseline Eval: 48    Time 5    Period Weeks    Status New    Target Date 01/31/22      PT SHORT TERM GOAL #4   Title 5xSTS<13sec s UE chair + airex pad.  Baseline Eval 15sec    Time 6    Period Weeks    Status New    Target Date 02/07/22              PT Long Term Goals - 01/03/22 1836       PT LONG TERM GOAL #1   Title Patient will have improved function and activity level as evidenced by an increase in FOTO score by 13 points or more.    Baseline Eval 48    Time 7    Period Weeks    Status New    Target Date 02/14/22      PT LONG TERM GOAL #2   Title 6MWT >1227f c LRAD    Baseline Eval: 3074fin 21m35m    Time 8    Period Weeks    Status New    Target Date 02/21/22      PT LONG TERM GOAL #3   Title Pt will improve his DGI score with rw to > 19 points as a demonstration of decreased fall risk.    Baseline DGI with rw: 15 (01/03/2022)    Time 9    Period Weeks    Status Revised    Target Date 02/28/22      PT LONG TERM GOAL #4   Title 5xSTS<15sec from chair, s BUE    Baseline eval: requires arms from chair height    Time 10    Period Weeks    Status New    Target Date 03/21/22              Plan - 01/03/22 1548     Clinical Impression Statement Worked on increasing stride length, as well as amplitude of movement to decrease festinating gait pattern and decrease risk for falls. Pt tolerated session well without aggravation of symptoms. Pt will benefit from continued skilled physical therapy services to improve strength, balance, function, and decrease fall risk.    Personal Factors and  Comorbidities Age;Past/Current Experience    Comorbidities PD, H/o cancer,    Examination-Activity Limitations Squat;Stairs;Stand;Locomotion Level;Reach Overhead    Examination-Participation Restrictions Cleaning;Medication Management;Interpersonal Relationship;Community Activity    Stability/Clinical Decision Making Evolving/Moderate complexity    Rehab Potential Fair    PT Frequency 2x / week    PT Duration 8 weeks    PT Treatment/Interventions ADLs/Self Care Home Management;Neuromuscular re-education;Balance training;Therapeutic exercise;Orthotic Fit/Training;Prosthetic Training;Patient/family education;Wheelchair mobility training;Manual techniques;Passive range of motion;Energy conservation;Vestibular;Joint Manipulations;Gait training;Therapeutic activities;Functional mobility training;Stair training    PT Next Visit Plan Standardized balance assessment    PT Home Exercise Plan defered to visit 2    Consulted and Agree with Plan of Care Patient             MigJoneen Boers, DPT  01/03/2022, 6:39 PM

## 2022-01-06 ENCOUNTER — Ambulatory Visit: Payer: Medicare HMO

## 2022-01-06 DIAGNOSIS — R262 Difficulty in walking, not elsewhere classified: Secondary | ICD-10-CM

## 2022-01-06 DIAGNOSIS — R42 Dizziness and giddiness: Secondary | ICD-10-CM

## 2022-01-06 DIAGNOSIS — R293 Abnormal posture: Secondary | ICD-10-CM

## 2022-01-06 NOTE — Therapy (Signed)
OUTPATIENT PHYSICAL THERAPY TREATMENT NOTE   Patient Name: Wesley Anderson. MRN: [id number] DOB:1945-10-12, 76 y.o., male Today's Date: 01/06/2022  PCP: Emerson Monte, MD REFERRING PROVIDER: Ramonita Lab III, MD   PT End of Session - 01/06/22 1503     Visit Number 4    Number of Visits 24    Date for PT Re-Evaluation 03/21/22    Authorization Type [employer] Medicare    Authorization Time Period 12/27/21-03/21/22    Progress Note Due on Visit 10    PT Start Time 1503    PT Stop Time 1543    PT Time Calculation (min) 40 min    Equipment Utilized During Treatment Gait belt    Activity Tolerance Patient tolerated treatment well;No increased pain    Behavior During Therapy Flat affect;WFL for tasks assessed/performed               Past Medical History:  Diagnosis Date   AAA (abdominal aortic aneurysm)    Alcohol dependence in remission ([place])    Cancer ([place])    skin    Colon polyps    ED (erectile dysfunction)    GERD (gastroesophageal reflux disease)    Hematest positive stools    Melanoma of skin (HCC)    Parkinson's disease ([place])    Renal cyst, right    Restless leg syndrome    Past Surgical History:  Procedure Laterality Date   CHOLECYSTECTOMY     COLON SURGERY     colectomy with anastamosis   COLONOSCOPY WITH PROPOFOL N/A 11/07/2016   Procedure: COLONOSCOPY WITH PROPOFOL;  Surgeon: Lollie Sails, MD;  Location: [facility] ENDOSCOPY;  Service: Endoscopy;  Laterality: N/A;   COLONOSCOPY WITH PROPOFOL N/A 09/06/2018   Procedure: COLONOSCOPY WITH PROPOFOL;  Surgeon: Lollie Sails, MD;  Location: [facility] ENDOSCOPY;  Service: Endoscopy;  Laterality: N/A;   COLONOSCOPY WITH PROPOFOL N/A 05/16/2019   Procedure: COLONOSCOPY WITH PROPOFOL;  Surgeon: Lollie Sails, MD;  Location: [facility] ENDOSCOPY;  Service: Endoscopy;  Laterality: N/A;   CYST EXCISION     vocal cord   DBS placement     deep brain implant     ESOPHAGOGASTRODUODENOSCOPY (EGD) WITH PROPOFOL N/A 09/06/2018    Procedure: ESOPHAGOGASTRODUODENOSCOPY (EGD) WITH PROPOFOL;  Surgeon: Lollie Sails, MD;  Location: [facility] ENDOSCOPY;  Service: Endoscopy;  Laterality: N/A;   MELANOMA EXCISION     TONSILLECTOMY     Patient Active Problem List   Diagnosis Date Noted   Acute gastroenteritis 08/08/2021   Sepsis ([place]) 08/08/2021   Confusion    Anemia 07/09/2021   Weakness 07/09/2021   Fever and chills 07/09/2021   Myalgia 07/09/2021   Influenza A 98/33/8250   Acute metabolic encephalopathy 53/97/6734   AKI (acute kidney injury) ([place]) 07/09/2020   Leukocytosis 07/09/2020   S/P deep brain stimulator placement 07/09/2020   Squamous cell carcinoma, 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 04/24/2020   Heme positive stool 05/27/2016   Alcohol dependence in remission ([place]) 07/31/2013   Cognitive impairment 02/22/2013   History of malignant melanoma of skin 05/15/2012   Parkinson's disease ([place]) 09/28/2011    REFERRING DIAG: Unsteadiness on feet, history of falls  THERAPY DIAG:  Difficulty in walking, not elsewhere classified  Dizziness and giddiness  Abnormal posture  PERTINENT HISTORY: Wesley Anderson is a 19yoM who is referred to [facility] OPPT for on-going imbalance in the setting of Parkinson's Disease. Pt was an active patient here in Nov 2022, but DC due to hospital admission for Flu  in November and another due to encephalopathy in December. Pt working with HHPT/OT in the interim. Pt reports no falls in the past several months, had made efforts to be as safe as possible  PRECAUTIONS: Fall risk  SUBJECTIVE: Doing not bad. No pain or discomfort anywhere.   PAIN:  Are you having pain? No     TODAY'S TREATMENT:  Neuromuscular re-education  CGA with standing tasks  Gait with rw, cues for increasing step length, and hip flexion 200 ft   Sit <> stand from regular chair with arms, without UE assist 10x    Turning 360 degrees to the R and to the L 2x without UE assist CGA, cues for larger steps    Standing large hip flexion slow without UE assist 10x3 each LE   Stepping over 3 mini hurdles with one UE assist at treadmill bar 2x2 each direction  Cues for increase hip flexion and step length for foot clearance   Standing with B UE assist   Hip abduction, alternating, emphasis on large movements 10x3   SLS with   contralateral UE assist   R 10x5 seconds   L 10x5 seconds  Standing with feet apart, B shoulder horizontal abduction at comfortable shoulder flexion position. Cues for large movement. 10x3  Static standing lunges with one UE assist   R 10x2  L 10x2. Difficult, R glute med and L quadriceps weakness observed. Improved ability to perform with increased repetitions.      Improved exercise technique, movement at target joints, use of target muscles after mod verbal, visual, tactile cues.   Response to treatment Pt tolerated session well without aggravation of symptoms.     Clinical impression  Worked on large movements to promote foot and obstacle clearance when ambulating. Pt able to clear mini hurdles with one UE assist from treadmill bar. Worked on glute strength as well to promote single leg stance balance during gait.  Pt tolerated session well without aggravation of symptoms. Pt will benefit from continued skilled physical therapy services to improve strength, balance, function, and decrease fall risk.        PATIENT EDUCATION: Education details: ther-ex, HEP Person educated: Patient Education method: Explanation, Demonstration, Tactile cues, Verbal cues, and Handouts Education comprehension: verbalized understanding and returned demonstration   HOME EXERCISE PROGRAM: Access Code: [email or web address] URL: [email or web address] Date: 12/29/2021 Prepared by: Joneen Boers  Exercises - Standing Hip Abduction with Counter Support  - 1 x daily - 7 x weekly - 2 sets - 10 reps - 5 seconds hold - Sit to Stand with Counter Support  - 1 x daily - 7 x  weekly - 2 sets - 5 reps      PT Short Term Goals - 12/27/21 2116       PT SHORT TERM GOAL #1   Title Patient will demonstrate independence with home exercise program for ability to self treat.    Baseline pending    Time 2    Period Weeks    Status New    Target Date 01/24/22      PT SHORT TERM GOAL #2   Title 6MWT c LRAD >822f -> (+) IADL    Baseline 3012feval    Time 4    Period Weeks    Status New    Target Date 01/24/22      PT SHORT TERM GOAL #3   Title FOTO>10% increase    Baseline Eval: 48    Time 5    Period  Weeks    Status New    Target Date 01/31/22      PT SHORT TERM GOAL #4   Title 5xSTS<13sec s UE chair + airex pad.    Baseline Eval 15sec    Time 6    Period Weeks    Status New    Target Date 02/07/22              PT Long Term Goals - 01/03/22 1836       PT LONG TERM GOAL #1   Title Patient will have improved function and activity level as evidenced by an increase in FOTO score by 13 points or more.    Baseline Eval 48    Time 7    Period Weeks    Status New    Target Date 02/14/22      PT LONG TERM GOAL #2   Title 6MWT >123f c LRAD    Baseline Eval: 3088fin 12m13m    Time 8    Period Weeks    Status New    Target Date 02/21/22      PT LONG TERM GOAL #3   Title Pt will improve his DGI score with rw to > 19 points as a demonstration of decreased fall risk.    Baseline DGI with rw: 15 (01/03/2022)    Time 9    Period Weeks    Status Revised    Target Date 02/28/22      PT LONG TERM GOAL #4   Title 5xSTS<15sec from chair, s BUE    Baseline eval: requires arms from chair height    Time 10    Period Weeks    Status New    Target Date 03/21/22              Plan - 01/06/22 1502     Clinical Impression Statement Worked on large movements to promote foot and obstacle clearance when ambulating. Pt able to clear mini hurdles with one UE assist from treadmill bar. Worked on glute strength as well to promote single leg  stance balance during gait.  Pt tolerated session well without aggravation of symptoms. Pt will benefit from continued skilled physical therapy services to improve strength, balance, function, and decrease fall risk.    Personal Factors and Comorbidities Age;Past/Current Experience    Comorbidities PD, H/o cancer,    Examination-Activity Limitations Squat;Stairs;Stand;Locomotion Level;Reach Overhead    Examination-Participation Restrictions Cleaning;Medication Management;Interpersonal Relationship;Community Activity    Stability/Clinical Decision Making Evolving/Moderate complexity    Rehab Potential Fair    PT Frequency 2x / week    PT Duration 8 weeks    PT Treatment/Interventions ADLs/Self Care Home Management;Neuromuscular re-education;Balance training;Therapeutic exercise;Orthotic Fit/Training;Prosthetic Training;Patient/family education;Wheelchair mobility training;Manual techniques;Passive range of motion;Energy conservation;Vestibular;Joint Manipulations;Gait training;Therapeutic activities;Functional mobility training;Stair training    PT Next Visit Plan Standardized balance assessment    PT Home Exercise Plan defered to visit 2    Consulted and Agree with Plan of Care Patient              MigJoneen Boers, DPT  01/06/2022, 5:39 PM

## 2022-01-11 ENCOUNTER — Ambulatory Visit: Payer: Medicare HMO

## 2022-01-13 ENCOUNTER — Ambulatory Visit: Payer: Medicare HMO

## 2022-01-17 ENCOUNTER — Ambulatory Visit: Payer: Medicare HMO

## 2022-01-26 ENCOUNTER — Ambulatory Visit: Payer: Medicare HMO | Attending: Internal Medicine

## 2022-01-26 DIAGNOSIS — R262 Difficulty in walking, not elsewhere classified: Secondary | ICD-10-CM | POA: Insufficient documentation

## 2022-01-26 DIAGNOSIS — R293 Abnormal posture: Secondary | ICD-10-CM | POA: Diagnosis present

## 2022-01-26 NOTE — Therapy (Signed)
OUTPATIENT PHYSICAL THERAPY TREATMENT NOTE   Patient Name: Wesley Anderson. MRN: [id number] DOB:April 10, 1946, 75 y.o., male Today's Date: 01/26/2022  PCP: Emerson Monte, MD REFERRING PROVIDER: Ramonita Lab III, MD   PT End of Session - 01/26/22 1551     Visit Number 5    Number of Visits 24    Date for PT Re-Evaluation 03/21/22    Authorization Type [employer]    Authorization Time Period 12/27/21-03/21/22    Progress Note Due on Visit 10    PT Start Time 1552    PT Stop Time 1630    PT Time Calculation (min) 38 min    Equipment Utilized During Treatment Gait belt    Activity Tolerance Patient tolerated treatment well;No increased pain    Behavior During Therapy Flat affect;WFL for tasks assessed/performed                Past Medical History:  Diagnosis Date   AAA (abdominal aortic aneurysm)    Alcohol dependence in remission ([place])    Cancer ([place])    skin    Colon polyps    ED (erectile dysfunction)    GERD (gastroesophageal reflux disease)    Hematest positive stools    Melanoma of skin (H[facility])    Parkinson's disease ([place])    Renal cyst, right    Restless leg syndrome    Past Surgical History:  Procedure Laterality Date   CHOLECYSTECTOMY     COLON SURGERY     colectomy with anastamosis   COLONOSCOPY WITH PROPOFOL N/A 11/07/2016   Procedure: COLONOSCOPY WITH PROPOFOL;  Surgeon: Lollie Sails, MD;  Location: [facility] ENDOSCOPY;  Service: Endoscopy;  Laterality: N/A;   COLONOSCOPY WITH PROPOFOL N/A 09/06/2018   Procedure: COLONOSCOPY WITH PROPOFOL;  Surgeon: Lollie Sails, MD;  Location: [facility] ENDOSCOPY;  Service: Endoscopy;  Laterality: N/A;   COLONOSCOPY WITH PROPOFOL N/A 05/16/2019   Procedure: COLONOSCOPY WITH PROPOFOL;  Surgeon: Lollie Sails, MD;  Location: [facility] ENDOSCOPY;  Service: Endoscopy;  Laterality: N/A;   CYST EXCISION     vocal cord   DBS placement     deep brain implant     ESOPHAGOGASTRODUODENOSCOPY (EGD) WITH PROPOFOL N/A 09/06/2018    Procedure: ESOPHAGOGASTRODUODENOSCOPY (EGD) WITH PROPOFOL;  Surgeon: Lollie Sails, MD;  Location: [facility] ENDOSCOPY;  Service: Endoscopy;  Laterality: N/A;   MELANOMA EXCISION     TONSILLECTOMY     Patient Active Problem List   Diagnosis Date Noted   Acute gastroenteritis 08/08/2021   Sepsis ([place]) 08/08/2021   Confusion    Anemia 07/09/2021   Weakness 07/09/2021   Fever and chills 07/09/2021   Myalgia 07/09/2021   Influenza A 69/62/9528   Acute metabolic encephalopathy 41/32/4401   AKI (acute kidney injury) ([place]) 07/09/2020   Leukocytosis 07/09/2020   S/P deep brain stimulator placement 07/09/2020   Squamous cell carcinoma, 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 04/24/2020   Heme positive stool 05/27/2016   Alcohol dependence in remission ([place]) 07/31/2013   Cognitive impairment 02/22/2013   History of malignant melanoma of skin 05/15/2012   Parkinson's disease ([place]) 09/28/2011    REFERRING DIAG: Unsteadiness on feet, history of falls  THERAPY DIAG:  Difficulty in walking, not elsewhere classified  Abnormal posture  PERTINENT HISTORY: Wesley Anderson is a 34yoM who is referred to [facility] OPPT for on-going imbalance in the setting of Parkinson's Disease. Pt was an active patient here in Nov 2022, but DC due to hospital admission for Flu in November and  another due to encephalopathy in December. Pt working with HHPT/OT in the interim. Pt reports no falls in the past several months, had made efforts to be as safe as possible  PRECAUTIONS: Fall risk  SUBJECTIVE: feels better than he did. Had shingles R side of his face.   PAIN:  Are you having pain? No     TODAY'S TREATMENT:  Neuromuscular re-education  CGA with standing tasks  Gait with rw, cues for increasing step length, and hip flexion 100 ft   Cues to stay inside rw  Sit <> stand from regular chair with arms, without UE assist 10x   Turning 360 degrees to the R and to the L 2x without UE assist CGA, cues for  larger steps   Demonstrates L glute med weakness during stance phase.    Standing with B UE assist   Hip abduction, alternating, emphasis on large movements 10x3  SLS with   contralateral UE assist   R 10x5 seconds for 2 sets   L 10x5 seconds  for 2 sets  Gait with rw, cues for increasing step length, and hip flexion 220 ft   Cues to stay inside rw   Nustep seat 7, arms 7, level 2 for 3 minutes. Cues to maintain at least 90+ SPM  Warm down x 3 minutes. Cues for proper pace  Static standing lunges with one UE assist   R 10x2  L 10x2. Difficult, R glute med and L quadriceps weakness observed. Improved ability to perform with increased repetitions.     Improved exercise technique, movement at target joints, use of target muscles after mod verbal, visual, tactile cues.   Response to treatment Pt tolerated session well without aggravation of symptoms.     Clinical impression  Worked on balance to decrease fall risk and injury. Demonstrates tendency for backward lean, smaller LE movements and festinating gait pattern. Cues needed to correct. Continued working on large movements to promote foot and obstacle clearance when ambulating. Difficulty with festination during turns, cues to increase hip flexion and step length to improve balance. Pt tolerated session well without aggravation of symptoms. Pt will benefit from continued skilled physical therapy services to improve strength, balance, function, and decrease fall risk.        PATIENT EDUCATION: Education details: ther-ex, HEP Person educated: Patient Education method: Explanation, Demonstration, Tactile cues, Verbal cues, and Handouts Education comprehension: verbalized understanding and returned demonstration   HOME EXERCISE PROGRAM: Access Code: [email or web address] URL: [email or web address] Date: 12/29/2021 Prepared by: Joneen Boers  Exercises - Standing Hip Abduction with Counter Support  - 1 x daily - 7 x  weekly - 2 sets - 10 reps - 5 seconds hold - Sit to Stand with Counter Support  - 1 x daily - 7 x weekly - 2 sets - 5 reps      PT Short Term Goals - 12/27/21 2116       PT SHORT TERM GOAL #1   Title Patient will demonstrate independence with home exercise program for ability to self treat.    Baseline pending    Time 2    Period Weeks    Status New    Target Date 01/24/22      PT SHORT TERM GOAL #2   Title 6MWT c LRAD >859f -> (+) IADL    Baseline 3019feval    Time 4    Period Weeks    Status New    Target Date 01/24/22  PT SHORT TERM GOAL #3   Title FOTO>10% increase    Baseline Eval: 48    Time 5    Period Weeks    Status New    Target Date 01/31/22      PT SHORT TERM GOAL #4   Title 5xSTS<13sec s UE chair + airex pad.    Baseline Eval 15sec    Time 6    Period Weeks    Status New    Target Date 02/07/22              PT Long Term Goals - 01/03/22 1836       PT LONG TERM GOAL #1   Title Patient will have improved function and activity level as evidenced by an increase in FOTO score by 13 points or more.    Baseline Eval 48    Time 7    Period Weeks    Status New    Target Date 02/14/22      PT LONG TERM GOAL #2   Title 6MWT >1240f c LRAD    Baseline Eval: 3052fin 28m44m    Time 8    Period Weeks    Status New    Target Date 02/21/22      PT LONG TERM GOAL #3   Title Pt will improve his DGI score with rw to > 19 points as a demonstration of decreased fall risk.    Baseline DGI with rw: 15 (01/03/2022)    Time 9    Period Weeks    Status Revised    Target Date 02/28/22      PT LONG TERM GOAL #4   Title 5xSTS<15sec from chair, s BUE    Baseline eval: requires arms from chair height    Time 10    Period Weeks    Status New    Target Date 03/21/22              Plan - 01/26/22 1551     Clinical Impression Statement Worked on balance to decrease fall risk and injury. Demonstrates tendency for backward lean, smaller LE  movements and festinating gait pattern. Cues needed to correct. Continued working on large movements to promote foot and obstacle clearance when ambulating. Difficulty with festination during turns, cues to increase hip flexion and step length to improve balance. Pt tolerated session well without aggravation of symptoms. Pt will benefit from continued skilled physical therapy services to improve strength, balance, function, and decrease fall risk.    Personal Factors and Comorbidities Age;Past/Current Experience    Comorbidities PD, H/o cancer,    Examination-Activity Limitations Squat;Stairs;Stand;Locomotion Level;Reach Overhead    Examination-Participation Restrictions Cleaning;Medication Management;Interpersonal Relationship;Community Activity    Stability/Clinical Decision Making Evolving/Moderate complexity    Rehab Potential Fair    PT Frequency 2x / week    PT Duration 8 weeks    PT Treatment/Interventions ADLs/Self Care Home Management;Neuromuscular re-education;Balance training;Therapeutic exercise;Orthotic Fit/Training;Prosthetic Training;Patient/family education;Wheelchair mobility training;Manual techniques;Passive range of motion;Energy conservation;Vestibular;Joint Manipulations;Gait training;Therapeutic activities;Functional mobility training;Stair training    PT Next Visit Plan Standardized balance assessment    PT Home Exercise Plan defered to visit 2    Consulted and Agree with Plan of Care Patient               MigJoneen Boers, DPT  01/26/2022, 4:40 PM

## 2022-01-31 ENCOUNTER — Ambulatory Visit: Payer: Medicare HMO

## 2022-02-02 ENCOUNTER — Ambulatory Visit: Payer: Medicare HMO

## 2022-02-02 DIAGNOSIS — R262 Difficulty in walking, not elsewhere classified: Secondary | ICD-10-CM

## 2022-02-02 DIAGNOSIS — R293 Abnormal posture: Secondary | ICD-10-CM

## 2022-02-02 NOTE — Therapy (Signed)
OUTPATIENT PHYSICAL THERAPY TREATMENT NOTE   Patient Name: Wesley Anderson. MRN: [id number] DOB:07-Jan-1946, 76 y.o., male Today's Date: 02/02/2022  PCP: Emerson Monte, MD REFERRING PROVIDER: Ramonita Lab III, MD   PT End of Session - 02/02/22 1543     Visit Number 6    Number of Visits 24    Date for PT Re-Evaluation 03/21/22    Authorization Type [employer] Medicare    Authorization Time Period 12/27/21-03/21/22    Authorization - Number of Visits 6    Progress Note Due on Visit 10    PT Start Time 7824    PT Stop Time 1626    PT Time Calculation (min) 42 min    Equipment Utilized During Treatment Gait belt    Activity Tolerance Patient tolerated treatment well;No increased pain    Behavior During Therapy Flat affect;WFL for tasks assessed/performed                 Past Medical History:  Diagnosis Date   AAA (abdominal aortic aneurysm)    Alcohol dependence in remission ([place])    Cancer ([place])    skin    Colon polyps    ED (erectile dysfunction)    GERD (gastroesophageal reflux disease)    Hematest positive stools    Melanoma of skin (HCC)    Parkinson's disease ([place])    Renal cyst, right    Restless leg syndrome    Past Surgical History:  Procedure Laterality Date   CHOLECYSTECTOMY     COLON SURGERY     colectomy with anastamosis   COLONOSCOPY WITH PROPOFOL N/A 11/07/2016   Procedure: COLONOSCOPY WITH PROPOFOL;  Surgeon: Lollie Sails, MD;  Location: [facility] ENDOSCOPY;  Service: Endoscopy;  Laterality: N/A;   COLONOSCOPY WITH PROPOFOL N/A 09/06/2018   Procedure: COLONOSCOPY WITH PROPOFOL;  Surgeon: Lollie Sails, MD;  Location: [facility] ENDOSCOPY;  Service: Endoscopy;  Laterality: N/A;   COLONOSCOPY WITH PROPOFOL N/A 05/16/2019   Procedure: COLONOSCOPY WITH PROPOFOL;  Surgeon: Lollie Sails, MD;  Location: [facility] ENDOSCOPY;  Service: Endoscopy;  Laterality: N/A;   CYST EXCISION     vocal cord   DBS placement     deep brain implant      ESOPHAGOGASTRODUODENOSCOPY (EGD) WITH PROPOFOL N/A 09/06/2018   Procedure: ESOPHAGOGASTRODUODENOSCOPY (EGD) WITH PROPOFOL;  Surgeon: Lollie Sails, MD;  Location: [facility] ENDOSCOPY;  Service: Endoscopy;  Laterality: N/A;   MELANOMA EXCISION     TONSILLECTOMY     Patient Active Problem List   Diagnosis Date Noted   Acute gastroenteritis 08/08/2021   Sepsis ([place]) 08/08/2021   Confusion    Anemia 07/09/2021   Weakness 07/09/2021   Fever and chills 07/09/2021   Myalgia 07/09/2021   Influenza A 23/53/6144   Acute metabolic encephalopathy 31/54/0086   AKI (acute kidney injury) ([place]) 07/09/2020   Leukocytosis 07/09/2020   S/P deep brain stimulator placement 07/09/2020   Squamous cell carcinoma, 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 04/24/2020   Heme positive stool 05/27/2016   Alcohol dependence in remission ([place]) 07/31/2013   Cognitive impairment 02/22/2013   History of malignant melanoma of skin 05/15/2012   Parkinson's disease ([place]) 09/28/2011    REFERRING DIAG: Unsteadiness on feet, history of falls  THERAPY DIAG:  Difficulty in walking, not elsewhere classified  Abnormal posture  PERTINENT HISTORY: Edword Cu is a 81yoM who is referred to [facility] OPPT for on-going imbalance in the setting of Parkinson's Disease. Pt was an active patient here in Nov 2022, but  DC due to hospital admission for Flu in November and another due to encephalopathy in December. Pt working with HHPT/OT in the interim. Pt reports no falls in the past several months, had made efforts to be as safe as possible  PRECAUTIONS: Fall risk  SUBJECTIVE: feeling better today. No pain, just the brain.  PAIN:  Are you having pain? No     TODAY'S TREATMENT:  Neuromuscular re-education  CGA with standing tasks  Nustep seat 7-11 (to increase amplitude of LE movement, arms 7, level 2 for 3 minutes. Cues to maintain at least 90+ SPM  Warm down x 3 minutes. Cues for proper pace  Performed to promote alternating  reciprocal large movements   Seated (on NuStep Chair)  B shoulder horizontal abduction with thoracic extension   Standing static lunge with one UE assist   R 10x  L 10x  Standing static lateral lunge with B UE assist   R 10x3  L 10x3  Gait with rw, cues for increasing step length, and hip flexion 200 ft   Cues for increased hip and knee flexion   SLS with   contralateral UE assist   R 10x5 seconds for 2 sets   L 10x5 seconds  for 2 sets  Standing large static marches 10x3  Standing static large arm swings 10x3   Turning 360 degrees to the R and to the L 2x without UE assist CGA, cues for larger steps          Cues for large movements Improved exercise technique, movement at target joints, use of target muscles after mod verbal, visual, tactile cues.     Response to treatment Pt tolerated session well without aggravation of symptoms.     Clinical impression  Worked on balance to decrease fall risk and injury. Continued working on improving amplitude of movement to cue better mechanics to the brain and help decrease festinating gait pattern. Pt tolerated session well without aggravation of symptoms. Pt will benefit from continued skilled physical therapy services to improve strength, balance, function, and decrease fall risk.        PATIENT EDUCATION: Education details: ther-ex, HEP Person educated: Patient Education method: Explanation, Demonstration, Tactile cues, Verbal cues, and Handouts Education comprehension: verbalized understanding and returned demonstration   HOME EXERCISE PROGRAM: Access Code: [email or web address] URL: [email or web address] Date: 12/29/2021 Prepared by: Joneen Boers  Exercises - Standing Hip Abduction with Counter Support  - 1 x daily - 7 x weekly - 2 sets - 10 reps - 5 seconds hold - Sit to Stand with Counter Support  - 1 x daily - 7 x weekly - 2 sets - 5 reps      PT Short Term Goals - 12/27/21 2116       PT  SHORT TERM GOAL #1   Title Patient will demonstrate independence with home exercise program for ability to self treat.    Baseline pending    Time 2    Period Weeks    Status New    Target Date 01/24/22      PT SHORT TERM GOAL #2   Title 6MWT c LRAD >86f -> (+) IADL    Baseline 3040feval    Time 4    Period Weeks    Status New    Target Date 01/24/22      PT SHORT TERM GOAL #3   Title FOTO>10% increase    Baseline Eval: 48    Time 5    Period Weeks  Status New    Target Date 01/31/22      PT SHORT TERM GOAL #4   Title 5xSTS<13sec s UE chair + airex pad.    Baseline Eval 15sec    Time 6    Period Weeks    Status New    Target Date 02/07/22              PT Long Term Goals - 01/03/22 1836       PT LONG TERM GOAL #1   Title Patient will have improved function and activity level as evidenced by an increase in FOTO score by 13 points or more.    Baseline Eval 48    Time 7    Period Weeks    Status New    Target Date 02/14/22      PT LONG TERM GOAL #2   Title 6MWT >1253f c LRAD    Baseline Eval: 3039fin 6m56m    Time 8    Period Weeks    Status New    Target Date 02/21/22      PT LONG TERM GOAL #3   Title Pt will improve his DGI score with rw to > 19 points as a demonstration of decreased fall risk.    Baseline DGI with rw: 15 (01/03/2022)    Time 9    Period Weeks    Status Revised    Target Date 02/28/22      PT LONG TERM GOAL #4   Title 5xSTS<15sec from chair, s BUE    Baseline eval: requires arms from chair height    Time 10    Period Weeks    Status New    Target Date 03/21/22              Plan - 02/02/22 1631     Clinical Impression Statement Worked on balance to decrease fall risk and injury. Continued working on improving amplitude of movement to cue better mechanics to the brain and help decrease festinating gait pattern. Pt tolerated session well without aggravation of symptoms. Pt will benefit from continued skilled physical  therapy services to improve strength, balance, function, and decrease fall risk.    Personal Factors and Comorbidities Age;Past/Current Experience    Comorbidities PD, H/o cancer,    Examination-Activity Limitations Squat;Stairs;Stand;Locomotion Level;Reach Overhead    Examination-Participation Restrictions Cleaning;Medication Management;Interpersonal Relationship;Community Activity    Stability/Clinical Decision Making Evolving/Moderate complexity    Rehab Potential Fair    PT Frequency 2x / week    PT Duration 8 weeks    PT Treatment/Interventions ADLs/Self Care Home Management;Neuromuscular re-education;Balance training;Therapeutic exercise;Orthotic Fit/Training;Prosthetic Training;Patient/family education;Wheelchair mobility training;Manual techniques;Passive range of motion;Energy conservation;Vestibular;Joint Manipulations;Gait training;Therapeutic activities;Functional mobility training;Stair training    PT Next Visit Plan Standardized balance assessment    PT Home Exercise Plan defered to visit 2    Consulted and Agree with Plan of Care Patient                MigJoneen Boers, DPT  02/02/2022, 4:33 PM

## 2022-02-08 ENCOUNTER — Ambulatory Visit: Payer: Medicare HMO

## 2022-02-10 ENCOUNTER — Ambulatory Visit: Payer: Medicare HMO

## 2022-02-10 DIAGNOSIS — R262 Difficulty in walking, not elsewhere classified: Secondary | ICD-10-CM | POA: Diagnosis not present

## 2022-02-10 DIAGNOSIS — R293 Abnormal posture: Secondary | ICD-10-CM

## 2022-02-10 NOTE — Therapy (Signed)
OUTPATIENT PHYSICAL THERAPY TREATMENT NOTE   Patient Name: Wesley Anderson. MRN: [id number] DOB:[birth year], 76 y.o., male Today's Date: 02/10/2022  PCP: Emerson Monte, MD REFERRING PROVIDER: Ramonita Lab III, MD   PT End of Session - 02/10/22 1417     Visit Number 7    Number of Visits 24    Date for PT Re-Evaluation 03/21/22    Authorization Type [employer]    Authorization Time Period 12/27/21-03/21/22    Authorization - Number of Visits 7    Progress Note Due on Visit 10    PT Start Time 2119    PT Stop Time 1457    PT Time Calculation (min) 40 min    Equipment Utilized During Treatment Gait belt    Activity Tolerance Patient tolerated treatment well;No increased pain    Behavior During Therapy Flat affect;WFL for tasks assessed/performed                  Past Medical History:  Diagnosis Date   AAA (abdominal aortic aneurysm)    Alcohol dependence in remission ([place])    Cancer ([place])    skin    Colon polyps    ED (erectile dysfunction)    GERD (gastroesophageal reflux disease)    Hematest positive stools    Melanoma of skin ([facility])    Parkinson's disease ([place])    Renal cyst, right    Restless leg syndrome    Past Surgical History:  Procedure Laterality Date   CHOLECYSTECTOMY     COLON SURGERY     colectomy with anastamosis   COLONOSCOPY WITH PROPOFOL N/A 11/07/2016   Procedure: COLONOSCOPY WITH PROPOFOL;  Surgeon: Lollie Sails, MD;  Location: [facility] ENDOSCOPY;  Service: Endoscopy;  Laterality: N/A;   COLONOSCOPY WITH PROPOFOL N/A 09/06/2018   Procedure: COLONOSCOPY WITH PROPOFOL;  Surgeon: Lollie Sails, MD;  Location: [facility] ENDOSCOPY;  Service: Endoscopy;  Laterality: N/A;   COLONOSCOPY WITH PROPOFOL N/A 05/16/2019   Procedure: COLONOSCOPY WITH PROPOFOL;  Surgeon: Lollie Sails, MD;  Location: [facility] ENDOSCOPY;  Service: Endoscopy;  Laterality: N/A;   CYST EXCISION     vocal cord   DBS placement     deep brain implant      ESOPHAGOGASTRODUODENOSCOPY (EGD) WITH PROPOFOL N/A 09/06/2018   Procedure: ESOPHAGOGASTRODUODENOSCOPY (EGD) WITH PROPOFOL;  Surgeon: Lollie Sails, MD;  Location: [facility] ENDOSCOPY;  Service: Endoscopy;  Laterality: N/A;   MELANOMA EXCISION     TONSILLECTOMY     Patient Active Problem List   Diagnosis Date Noted   Acute gastroenteritis 08/08/2021   Sepsis ([place]) 08/08/2021   Confusion    Anemia 07/09/2021   Weakness 07/09/2021   Fever and chills 07/09/2021   Myalgia 07/09/2021   Influenza A 41/74/0814   Acute metabolic encephalopathy 48/18/5631   AKI (acute kidney injury) ([place]) 07/09/2020   Leukocytosis 07/09/2020   S/P deep brain stimulator placement 07/09/2020   Squamous cell carcinoma, 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 04/24/2020   Heme positive stool 05/27/2016   Alcohol dependence in remission ([place]) 07/31/2013   Cognitive impairment 02/22/2013   History of malignant melanoma of skin 05/15/2012   Parkinson's disease ([place]) 09/28/2011    REFERRING DIAG: Unsteadiness on feet, history of falls  THERAPY DIAG:  Difficulty in walking, not elsewhere classified  Abnormal posture  PERTINENT HISTORY: Wesley Anderson is a 51yoM who is referred to [facility] OPPT for on-going imbalance in the setting of Parkinson's Disease. Pt was an active patient here in Nov 2022,  but DC due to hospital admission for Flu in November and another due to encephalopathy in December. Pt working with HHPT/OT in the interim. Pt reports no falls in the past several months, had made efforts to be as safe as possible  PRECAUTIONS: Fall risk  SUBJECTIVE: Doing ok. PAIN:  Are you having pain? No     TODAY'S TREATMENT:  Neuromuscular re-education  CGA with standing tasks  Nustep seat 7-11 (to increase amplitude of LE movement, arms 7, level 2 for 4 minutes. Cues to maintain at least 90+ SPM  Warm down x 2 minutes. Cues for proper pace  Performed to promote alternating reciprocal large movements  Sit <>  stand on NUstep chair 10x without UE assist  Standing static lunge with one UE assist   R 10x3  L 10x3  Standing hip flexion with contralateral hand tap 5x each LE. Difficult  Then in sitting 10x2 each LE   Seated (on NuStep Chair)  B shoulder horizontal abduction with thoracic extension 10x5 seconds   Then standing with wide stance 10x    Then with R and L trunk rotation 10x each side  Standing static lateral lunge with B UE assist   R 10x3  L 10x3  Gait with rw, cues for increasing step length, and hip flexion 200 ft   Cues for increased hip and knee flexion      Cues for large movements Improved exercise technique, movement at target joints, use of target muscles after mod verbal, visual, tactile cues.     Response to treatment Pt tolerated session well without aggravation of symptoms.     Clinical impression  Worked on balance to decrease fall risk and injury. Worked on improving upright and more open posture, larger movements, increasing hip flexion and step length to improve ability to ambulate and negotiate obstacles. Demonstrates festinating gait pattern. Pt tolerated session well without aggravation of symptoms. Pt will benefit from continued skilled physical therapy services to improve strength, balance, function, and decrease fall risk.        PATIENT EDUCATION: Education details: ther-ex, HEP Person educated: Patient Education method: Explanation, Demonstration, Tactile cues, Verbal cues, and Handouts Education comprehension: verbalized understanding and returned demonstration   HOME EXERCISE PROGRAM: Access Code: [email or web address] URL: [email or web address] Date: 12/29/2021 Prepared by: Joneen Boers  Exercises - Standing Hip Abduction with Counter Support  - 1 x daily - 7 x weekly - 2 sets - 10 reps - 5 seconds hold - Sit to Stand with Counter Support  - 1 x daily - 7 x weekly - 2 sets - 5 reps      PT Short Term Goals - 12/27/21 2116        PT SHORT TERM GOAL #1   Title Patient will demonstrate independence with home exercise program for ability to self treat.    Baseline pending    Time 2    Period Weeks    Status New    Target Date 01/24/22      PT SHORT TERM GOAL #2   Title 6MWT c LRAD >848f -> (+) IADL    Baseline 3062feval    Time 4    Period Weeks    Status New    Target Date 01/24/22      PT SHORT TERM GOAL #3   Title FOTO>10% increase    Baseline Eval: 48    Time 5    Period Weeks    Status New    Target Date 01/31/22  PT SHORT TERM GOAL #4   Title 5xSTS<13sec s UE chair + airex pad.    Baseline Eval 15sec    Time 6    Period Weeks    Status New    Target Date 02/07/22              PT Long Term Goals - 01/03/22 1836       PT LONG TERM GOAL #1   Title Patient will have improved function and activity level as evidenced by an increase in FOTO score by 13 points or more.    Baseline Eval 48    Time 7    Period Weeks    Status New    Target Date 02/14/22      PT LONG TERM GOAL #2   Title 6MWT >1265f c LRAD    Baseline Eval: 3041fin 68m50m    Time 8    Period Weeks    Status New    Target Date 02/21/22      PT LONG TERM GOAL #3   Title Pt will improve his DGI score with rw to > 19 points as a demonstration of decreased fall risk.    Baseline DGI with rw: 15 (01/03/2022)    Time 9    Period Weeks    Status Revised    Target Date 02/28/22      PT LONG TERM GOAL #4   Title 5xSTS<15sec from chair, s BUE    Baseline eval: requires arms from chair height    Time 10    Period Weeks    Status New    Target Date 03/21/22              Plan - 02/10/22 1414     Clinical Impression Statement Worked on balance to decrease fall risk and injury. Worked on improving upright and more open posture, larger movements, increasing hip flexion and step length to improve ability to ambulate and negotiate obstacles. Demonstrates festinating gait pattern. Pt tolerated session well  without aggravation of symptoms. Pt will benefit from continued skilled physical therapy services to improve strength, balance, function, and decrease fall risk.    Personal Factors and Comorbidities Age;Past/Current Experience    Comorbidities PD, H/o cancer,    Examination-Activity Limitations Squat;Stairs;Stand;Locomotion Level;Reach Overhead    Examination-Participation Restrictions Cleaning;Medication Management;Interpersonal Relationship;Community Activity    Stability/Clinical Decision Making Evolving/Moderate complexity    Rehab Potential Fair    PT Frequency 2x / week    PT Duration 8 weeks    PT Treatment/Interventions ADLs/Self Care Home Management;Neuromuscular re-education;Balance training;Therapeutic exercise;Orthotic Fit/Training;Prosthetic Training;Patient/family education;Wheelchair mobility training;Manual techniques;Passive range of motion;Energy conservation;Vestibular;Joint Manipulations;Gait training;Therapeutic activities;Functional mobility training;Stair training    PT Next Visit Plan Standardized balance assessment    PT Home Exercise Plan defered to visit 2    Consulted and Agree with Plan of Care Patient                 MigJoneen Boers, DPT  02/10/2022, 3:13 PM

## 2022-02-14 ENCOUNTER — Ambulatory Visit: Payer: Medicare HMO

## 2022-02-16 ENCOUNTER — Ambulatory Visit: Payer: Medicare HMO | Attending: Internal Medicine

## 2022-02-16 DIAGNOSIS — R262 Difficulty in walking, not elsewhere classified: Secondary | ICD-10-CM | POA: Diagnosis not present

## 2022-02-16 DIAGNOSIS — R293 Abnormal posture: Secondary | ICD-10-CM | POA: Diagnosis present

## 2022-02-16 DIAGNOSIS — R42 Dizziness and giddiness: Secondary | ICD-10-CM | POA: Insufficient documentation

## 2022-02-16 NOTE — Therapy (Signed)
OUTPATIENT PHYSICAL THERAPY TREATMENT NOTE   Patient Name: Wesley Anderson. MRN: [id number] DOB:November 13, 1945, 76 y.o., male Today's Date: 02/16/2022  PCP: Wesley Monte, MD REFERRING PROVIDER: Ramonita Lab III, MD   PT End of Session - 02/16/22 1417     Visit Number 8    Number of Visits 24    Date for PT Re-Evaluation 03/21/22    Authorization Type [employer]    Authorization Time Period 12/27/21-03/21/22    Authorization - Number of Visits 8    Progress Note Due on Visit 10    PT Start Time 8022    PT Stop Time 1459    PT Time Calculation (min) 41 min    Equipment Utilized During Treatment Gait belt    Activity Tolerance Patient tolerated treatment well;No increased pain    Behavior During Therapy Flat affect;WFL for tasks assessed/performed                   Past Medical History:  Diagnosis Date   AAA (abdominal aortic aneurysm)    Alcohol dependence in remission ([place])    Cancer ([place])    skin    Colon polyps    ED (erectile dysfunction)    GERD (gastroesophageal reflux disease)    Hematest positive stools    Melanoma of skin (H[facility])    Parkinson's disease ([place])    Renal cyst, right    Restless leg syndrome    Past Surgical History:  Procedure Laterality Date   CHOLECYSTECTOMY     COLON SURGERY     colectomy with anastamosis   COLONOSCOPY WITH PROPOFOL N/A 11/07/2016   Procedure: COLONOSCOPY WITH PROPOFOL;  Surgeon: Wesley Sails, MD;  Location: [facility] ENDOSCOPY;  Service: Endoscopy;  Laterality: N/A;   COLONOSCOPY WITH PROPOFOL N/A 09/06/2018   Procedure: COLONOSCOPY WITH PROPOFOL;  Surgeon: Wesley Sails, MD;  Location: [facility] ENDOSCOPY;  Service: Endoscopy;  Laterality: N/A;   COLONOSCOPY WITH PROPOFOL N/A 05/16/2019   Procedure: COLONOSCOPY WITH PROPOFOL;  Surgeon: Wesley Sails, MD;  Location: [facility] ENDOSCOPY;  Service: Endoscopy;  Laterality: N/A;   CYST EXCISION     vocal cord   DBS placement     deep brain implant      ESOPHAGOGASTRODUODENOSCOPY (EGD) WITH PROPOFOL N/A 09/06/2018   Procedure: ESOPHAGOGASTRODUODENOSCOPY (EGD) WITH PROPOFOL;  Surgeon: Wesley Sails, MD;  Location: [facility] ENDOSCOPY;  Service: Endoscopy;  Laterality: N/A;   MELANOMA EXCISION     TONSILLECTOMY     Patient Active Problem List   Diagnosis Date Noted   Acute gastroenteritis 08/08/2021   Sepsis ([place]) 08/08/2021   Confusion    Anemia 07/09/2021   Weakness 07/09/2021   Fever and chills 07/09/2021   Myalgia 07/09/2021   Influenza A 33/61/2244   Acute metabolic encephalopathy 97/53/0051   AKI (acute kidney injury) ([place]) 07/09/2020   Leukocytosis 07/09/2020   S/P deep brain stimulator placement 07/09/2020   Squamous cell carcinoma, 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 04/24/2020   Heme positive stool 05/27/2016   Alcohol dependence in remission ([place]) 07/31/2013   Cognitive impairment 02/22/2013   History of malignant melanoma of skin 05/15/2012   Parkinson's disease ([place]) 09/28/2011    REFERRING DIAG: Unsteadiness on feet, history of falls  THERAPY DIAG:  Difficulty in walking, not elsewhere classified  Abnormal posture  Dizziness and giddiness  PERTINENT HISTORY: Wesley Anderson is a 70yoM who is referred to [facility] OPPT for on-going imbalance in the setting of Parkinson's Disease. Pt was an active  patient here in Nov 2022, but DC due to hospital admission for Flu in November and another due to encephalopathy in December. Pt working with HHPT/OT in the interim. Pt reports no falls in the past several months, had made efforts to be as safe as possible  PRECAUTIONS: Fall risk  SUBJECTIVE: Slipped and fell at the shower the other day. Did not hurt anything. The ground was wet   PAIN:  Are you having pain? No     TODAY'S TREATMENT:  Neuromuscular re-education  CGA with standing tasks  Nustep seat 7-11 (to increase amplitude of LE movement, arms 7, level 2 for 4 minutes. Cues to maintain at least 90+ SPM  Warm  down x 2 minutes. Cues for proper pace  Performed to promote alternating reciprocal large movements  Sit <> stand from regular chair with air ex pad, no UE assist 5x2  17 seconds, 11 seconds (with visual cues to increase speed)  Gait with rw, cues for increasing step length, and hip flexion 500 ft   Cues for increased hip and knee flexion   Sitting with B feet propped on stool to stretch B hamstrings to improve knee extension while standing. 2 minutes x 2   Standing static lunge with one UE assist   R 10x3  L 10x3   Compensatory stepping correction- forward 3x   Tendency for forward weight shift, and continuous shuffling gait. Cues needed to stand up straight.   Score: (1)  Standing mini squat, no UE assist, cues for upright posture. 10x2     Cues for large movements Improved exercise technique, movement at target joints, use of target muscles after mod verbal, visual, tactile cues.     Response to treatment Pt tolerated session well without aggravation of symptoms.     Clinical impression  Improved 5xSTS time and gait distance using rw compared to initial evaluation. Pt continues to demonstrate festinating gait pattern, needing moderate cues to correct. Difficulty with compensatory stepping correction secondary to tendency for forward weight shift, and continuous shuffling gait. Continued working on balance to decrease fall risk and injury. Worked on improving upright posture, larger movements, increasing hip flexion and step length to improve ability to ambulate and negotiate obstacles. Pt tolerated session well without aggravation of symptoms. Pt will benefit from continued skilled physical therapy services to improve strength, balance, function, and decrease fall risk.        PATIENT EDUCATION: Education details: ther-ex, HEP Person educated: Patient Education method: Explanation, Demonstration, Tactile cues, Verbal cues, and Handouts Education comprehension:  verbalized understanding and returned demonstration   HOME EXERCISE PROGRAM: Access Code: [email or web address] URL: [email or web address] Date: 12/29/2021 Prepared by: Joneen Boers  Exercises - Standing Hip Abduction with Counter Support  - 1 x daily - 7 x weekly - 2 sets - 10 reps - 5 seconds hold - Sit to Stand with Counter Support  - 1 x daily - 7 x weekly - 2 sets - 5 reps      PT Short Term Goals - 02/16/22 1422       PT SHORT TERM GOAL #1   Title Patient will demonstrate independence with home exercise program for ability to self treat.    Baseline Has been doing his home exercises somewhat. No questions. (02/16/2022)    Time 2    Period Weeks    Status Partially Met    Target Date 01/24/22      PT SHORT TERM GOAL #2   Title 6MWT c LRAD >821ft -> (+)  IADL    Baseline 364ft eval; 500 ft with rw, CGA, cues for increasing foot clearance and step length and staying closer to the rw (02/16/2022)    Time 4    Period Weeks    Status Partially Met    Target Date 01/24/22      PT SHORT TERM GOAL #3   Title FOTO>10% increase    Baseline Eval: 48    Time 5    Period Weeks    Status New    Target Date 01/31/22      PT SHORT TERM GOAL #4   Title 5xSTS<13sec s UE chair + airex pad.    Baseline Eval 15sec; 11 seconds with cues for increased speed (02/16/2022)    Time 6    Period Weeks    Status Achieved    Target Date 02/07/22              PT Long Term Goals - 01/03/22 1836       PT LONG TERM GOAL #1   Title Patient will have improved function and activity level as evidenced by an increase in FOTO score by 13 points or more.    Baseline Eval 48    Time 7    Period Weeks    Status New    Target Date 02/14/22      PT LONG TERM GOAL #2   Title >1250ft c LRAD    Baseline Eval: 319ft in 41m43s    Time 8    Period Weeks    Status New    Target Date 02/21/22      PT LONG TERM GOAL #3   Title Pt will improve his DGI score with rw to > 19 points as a  demonstration of decreased fall risk.    Baseline DGI with rw: 15 (01/03/2022)    Time 9    Period Weeks    Status Revised    Target Date 02/28/22      PT LONG TERM GOAL #4   Title 5xSTS<15sec from chair, s BUE    Baseline eval: requires arms from chair height    Time 10    Period Weeks    Status New    Target Date 03/21/22              Plan - 02/16/22 1417     Clinical Impression Statement Improved 5xSTS time and gait distance using rw compared to initial evaluation. Pt continues to demonstrate festinating gait pattern, needing moderate cues to correct. Difficulty with compensatory stepping correction secondary to tendency for forward weight shift, and continuous shuffling gait. Continued working on balance to decrease fall risk and injury. Worked on improving upright posture, larger movements, increasing hip flexion and step length to improve ability to ambulate and negotiate obstacles. Pt tolerated session well without aggravation of symptoms. Pt will benefit from continued skilled physical therapy services to improve strength, balance, function, and decrease fall risk.    Personal Factors and Comorbidities Age;Past/Current Experience    Comorbidities PD, H/o cancer,    Examination-Activity Limitations Squat;Stairs;Stand;Locomotion Level;Reach Overhead    Examination-Participation Restrictions Cleaning;Medication Management;Interpersonal Relationship;Community Activity    Stability/Clinical Decision Making Evolving/Moderate complexity    Rehab Potential Fair    PT Frequency 2x / week    PT Duration 8 weeks    PT Treatment/Interventions ADLs/Self Care Home Management;Neuromuscular re-education;Balance training;Therapeutic exercise;Orthotic Fit/Training;Prosthetic Training;Patient/family education;Wheelchair mobility training;Manual techniques;Passive range of motion;Energy conservation;Vestibular;Joint Manipulations;Gait training;Therapeutic activities;Functional mobility  training;Stair training  PT Next Visit Plan Standardized balance assessment    PT Home Exercise Plan defered to visit 2    Consulted and Agree with Plan of Care Patient                  Joneen Boers PT, DPT  02/16/2022, 4:06 PM

## 2022-02-23 ENCOUNTER — Ambulatory Visit: Payer: Medicare HMO

## 2022-02-23 DIAGNOSIS — R262 Difficulty in walking, not elsewhere classified: Secondary | ICD-10-CM | POA: Diagnosis not present

## 2022-02-23 DIAGNOSIS — R293 Abnormal posture: Secondary | ICD-10-CM

## 2022-02-23 NOTE — Therapy (Signed)
OUTPATIENT PHYSICAL THERAPY TREATMENT NOTE   Patient Name: Wesley Anderson. MRN: [id number] DOB:October 24, 1945, 76 y.o., male Today's Date: 02/23/2022  PCP: Wesley Monte, MD REFERRING PROVIDER: Ramonita Lab III, MD   PT End of Session - 02/23/22 1728     Visit Number 9    Number of Visits 24    Date for PT Re-Evaluation 03/21/22    Authorization Type [employer]    Authorization Time Period 12/27/21-03/21/22    Authorization - Number of Visits 9    Progress Note Due on Visit 10    PT Start Time 1716    PT Stop Time 1754    PT Time Calculation (min) 38 min    Equipment Utilized During Treatment Gait belt    Activity Tolerance Patient tolerated treatment well;No increased pain    Behavior During Therapy Flat affect;WFL for tasks assessed/performed                    Past Medical History:  Diagnosis Date   AAA (abdominal aortic aneurysm)    Alcohol dependence in remission ([place])    Cancer ([place])    skin    Colon polyps    ED (erectile dysfunction)    GERD (gastroesophageal reflux disease)    Hematest positive stools    Melanoma of skin (HCC)    Parkinson's disease ([place])    Renal cyst, right    Restless leg syndrome    Past Surgical History:  Procedure Laterality Date   CHOLECYSTECTOMY     COLON SURGERY     colectomy with anastamosis   COLONOSCOPY WITH PROPOFOL N/A 11/07/2016   Procedure: COLONOSCOPY WITH PROPOFOL;  Surgeon: Wesley Sails, MD;  Location: [facility] ENDOSCOPY;  Service: Endoscopy;  Laterality: N/A;   COLONOSCOPY WITH PROPOFOL N/A 09/06/2018   Procedure: COLONOSCOPY WITH PROPOFOL;  Surgeon: Wesley Sails, MD;  Location: [facility] ENDOSCOPY;  Service: Endoscopy;  Laterality: N/A;   COLONOSCOPY WITH PROPOFOL N/A 05/16/2019   Procedure: COLONOSCOPY WITH PROPOFOL;  Surgeon: Wesley Sails, MD;  Location: [facility] ENDOSCOPY;  Service: Endoscopy;  Laterality: N/A;   CYST EXCISION     vocal cord   DBS placement     deep brain implant      ESOPHAGOGASTRODUODENOSCOPY (EGD) WITH PROPOFOL N/A 09/06/2018   Procedure: ESOPHAGOGASTRODUODENOSCOPY (EGD) WITH PROPOFOL;  Surgeon: Wesley Sails, MD;  Location: [facility] ENDOSCOPY;  Service: Endoscopy;  Laterality: N/A;   MELANOMA EXCISION     TONSILLECTOMY     Patient Active Problem List   Diagnosis Date Noted   Acute gastroenteritis 08/08/2021   Sepsis ([place]) 08/08/2021   Confusion    Anemia 07/09/2021   Weakness 07/09/2021   Fever and chills 07/09/2021   Myalgia 07/09/2021   Influenza A 31/54/0086   Acute metabolic encephalopathy 76/19/5093   AKI (acute kidney injury) ([place]) 07/09/2020   Leukocytosis 07/09/2020   S/P deep brain stimulator placement 07/09/2020   Squamous cell carcinoma, 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 04/24/2020   Heme positive stool 05/27/2016   Alcohol dependence in remission ([place]) 07/31/2013   Cognitive impairment 02/22/2013   History of malignant melanoma of skin 05/15/2012   Parkinson's disease ([place]) 09/28/2011    REFERRING DIAG: Unsteadiness on feet, history of falls  THERAPY DIAG:  Difficulty in walking, not elsewhere classified  Abnormal posture  PERTINENT HISTORY: Wesley Anderson is a 26yoM who is referred to [facility] OPPT for on-going imbalance in the setting of Parkinson's Disease. Pt was an active patient here in  Nov 2022, but DC due to hospital admission for Flu in November and another due to encephalopathy in December. Pt working with HHPT/OT in the interim. Pt reports no falls in the past several months, had made efforts to be as safe as possible  PRECAUTIONS: Fall risk  SUBJECTIVE: Back is bothering him. 3/10 currently. No pain when standing.    PAIN:  Are you having pain? No     TODAY'S TREATMENT:  Neuromuscular re-education  CGA with standing tasks  Sitting with lumbar towel roll when seated   Nustep seat 11 (to increase amplitude of LE movement, arms 11, level 2 for 4 minutes. To maintain at least 90+ SPM  Warm down x 2  minutes.   Performed to promote alternating reciprocal large movements  Gait with rw, cues for increasing step length, and hip flexion 200 ft   Cues for increased hip and knee flexion   Sitting with B feet propped on stool to stretch B hamstrings to improve knee extension while standing. 2 minutes x 2   Standing mini squat, no UE assist, cues for upright posture. 10x2  Standing hip flexion with opposite hand touch 10x2  Then with wide arms and increased trunk rotation 10x2   Compensatory stepping correction- forward 3x   Tendency for forward weight shift, and continuous shuffling gait. Cues needed to stand up straight.   Improved ability to stop and stabilize himself compared to previous session.   Compensatory stepping correction- R side 1x  Decreased ankle stability. Unable to perform.   Static lateral lunge   R 10x2  L 10x2    Cues for large movements Improved exercise technique, movement at target joints, use of target muscles after mod verbal, visual, tactile cues.     Response to treatment Pt tolerated session well without aggravation of symptoms.     Clinical impression  Improved compensatory forward stepping correction compared to previous visit with cues to stop and stand upright. Continued working on reciprocal alternating movement, increasing amplitude of movement, and balance to decrease fall risk. Pt tolerated session well without aggravation of symptoms. Pt will benefit from continued skilled physical therapy services to improve strength, balance, function, and decrease fall risk.        PATIENT EDUCATION: Education details: ther-ex, HEP Person educated: Patient Education method: Explanation, Demonstration, Tactile cues, Verbal cues, and Handouts Education comprehension: verbalized understanding and returned demonstration   HOME EXERCISE PROGRAM: Access Code: [email or web address] URL: [email or web address] Date: 12/29/2021 Prepared by: Wesley Anderson  Exercises - Standing Hip Abduction with Counter Support  - 1 x daily - 7 x weekly - 2 sets - 10 reps - 5 seconds hold - Sit to Stand with Counter Support  - 1 x daily - 7 x weekly - 2 sets - 5 reps      PT Short Term Goals - 02/16/22 1422       PT SHORT TERM GOAL #1   Title Patient will demonstrate independence with home exercise program for ability to self treat.    Baseline Has been doing his home exercises somewhat. No questions. (02/16/2022)    Time 2    Period Weeks    Status Partially Met    Target Date 01/24/22      PT SHORT TERM GOAL #2   Title 6MWT c LRAD >876f -> (+) IADL    Baseline 3086feval; 500 ft with rw, CGA, cues for increasing foot clearance and step length and staying closer to the rw (02/16/2022)  Time 4    Period Weeks    Status Partially Met    Target Date 01/24/22      PT SHORT TERM GOAL #3   Title FOTO>10% increase    Baseline Eval: 48    Time 5    Period Weeks    Status New    Target Date 01/31/22      PT SHORT TERM GOAL #4   Title 5xSTS<13sec s UE chair + airex pad.    Baseline Eval 15sec; 11 seconds with cues for increased speed (02/16/2022)    Time 6    Period Weeks    Status Achieved    Target Date 02/07/22              PT Long Term Goals - 01/03/22 1836       PT LONG TERM GOAL #1   Title Patient will have improved function and activity level as evidenced by an increase in FOTO score by 13 points or more.    Baseline Eval 48    Time 7    Period Weeks    Status New    Target Date 02/14/22      PT LONG TERM GOAL #2   Title 6MWT >1251f c LRAD    Baseline Eval: 3071fin 51m79m    Time 8    Period Weeks    Status New    Target Date 02/21/22      PT LONG TERM GOAL #3   Title Pt will improve his DGI score with rw to > 19 points as a demonstration of decreased fall risk.    Baseline DGI with rw: 15 (01/03/2022)    Time 9    Period Weeks    Status Revised    Target Date 02/28/22      PT LONG TERM GOAL #4   Title  5xSTS<15sec from chair, s BUE    Baseline eval: requires arms from chair height    Time 10    Period Weeks    Status New    Target Date 03/21/22              Plan - 02/23/22 1809     Clinical Impression Statement Improved compensatory forward stepping correction compared to previous visit with cues to stop and stand upright. Continued working on reciprocal alternating movement, increasing amplitude of movement, and balance to decrease fall risk. Pt tolerated session well without aggravation of symptoms. Pt will benefit from continued skilled physical therapy services to improve strength, balance, function, and decrease fall risk.    Personal Factors and Comorbidities Age;Past/Current Experience    Comorbidities PD, H/o cancer,    Examination-Activity Limitations Squat;Stairs;Stand;Locomotion Level;Reach Overhead    Examination-Participation Restrictions Cleaning;Medication Management;Interpersonal Relationship;Community Activity    Stability/Clinical Decision Making Evolving/Moderate complexity    Rehab Potential Fair    PT Frequency 2x / week    PT Duration 8 weeks    PT Treatment/Interventions ADLs/Self Care Home Management;Neuromuscular re-education;Balance training;Therapeutic exercise;Orthotic Fit/Training;Prosthetic Training;Patient/family education;Wheelchair mobility training;Manual techniques;Passive range of motion;Energy conservation;Vestibular;Joint Manipulations;Gait training;Therapeutic activities;Functional mobility training;Stair training    PT Next Visit Plan Standardized balance assessment    PT Home Exercise Plan defered to visit 2    Consulted and Agree with Plan of Care Patient                   MigJoneen Anderson, DPT  02/23/2022, 6:09 PM

## 2022-03-03 ENCOUNTER — Ambulatory Visit: Payer: Medicare HMO

## 2022-03-07 ENCOUNTER — Ambulatory Visit: Payer: Medicare HMO

## 2022-03-07 DIAGNOSIS — R262 Difficulty in walking, not elsewhere classified: Secondary | ICD-10-CM | POA: Diagnosis not present

## 2022-03-07 DIAGNOSIS — R293 Abnormal posture: Secondary | ICD-10-CM

## 2022-03-07 NOTE — Therapy (Signed)
OUTPATIENT PHYSICAL THERAPY TREATMENT NOTE/PROGRESS NOTE  DATES OF REPORTING PERIOD: 12/27/21 - 03/07/22   Patient Name: Wesley Anderson. MRN: [id number] DOB:18-May-1946, 76 y.o., male Today's Date: 03/07/2022  PCP: Emerson Monte, MD REFERRING PROVIDER: Ramonita Lab III, MD   PT End of Session - 03/07/22 1630     Visit Number 10    Number of Visits 24    Date for PT Re-Evaluation 03/21/22    Authorization Type [employer] Medicare    Authorization Time Period 12/27/21-03/21/22    Authorization - Number of Visits 9    Progress Note Due on Visit 10    PT Start Time 1630    PT Stop Time 1709    PT Time Calculation (min) 39 min    Equipment Utilized During Treatment Gait belt    Activity Tolerance Patient tolerated treatment well;No increased pain    Behavior During Therapy Flat affect;WFL for tasks assessed/performed                    Past Medical History:  Diagnosis Date   AAA (abdominal aortic aneurysm) (HCC)    Alcohol dependence in remission ([place])    Cancer ([place])    skin    Colon polyps    ED (erectile dysfunction)    GERD (gastroesophageal reflux disease)    Hematest positive stools    Melanoma of skin (HCC)    Parkinson's disease ([place])    Renal cyst, right    Restless leg syndrome    Past Surgical History:  Procedure Laterality Date   CHOLECYSTECTOMY     COLON SURGERY     colectomy with anastamosis   COLONOSCOPY WITH PROPOFOL N/A 11/07/2016   Procedure: COLONOSCOPY WITH PROPOFOL;  Surgeon: Lollie Sails, MD;  Location: [facility] ENDOSCOPY;  Service: Endoscopy;  Laterality: N/A;   COLONOSCOPY WITH PROPOFOL N/A 09/06/2018   Procedure: COLONOSCOPY WITH PROPOFOL;  Surgeon: Lollie Sails, MD;  Location: [facility] ENDOSCOPY;  Service: Endoscopy;  Laterality: N/A;   COLONOSCOPY WITH PROPOFOL N/A 05/16/2019   Procedure: COLONOSCOPY WITH PROPOFOL;  Surgeon: Lollie Sails, MD;  Location: [facility] ENDOSCOPY;  Service: Endoscopy;  Laterality: N/A;   CYST EXCISION      vocal cord   DBS placement     deep brain implant     ESOPHAGOGASTRODUODENOSCOPY (EGD) WITH PROPOFOL N/A 09/06/2018   Procedure: ESOPHAGOGASTRODUODENOSCOPY (EGD) WITH PROPOFOL;  Surgeon: Lollie Sails, MD;  Location: [facility] ENDOSCOPY;  Service: Endoscopy;  Laterality: N/A;   MELANOMA EXCISION     TONSILLECTOMY     Patient Active Problem List   Diagnosis Date Noted   Acute gastroenteritis 08/08/2021   Sepsis ([place]) 08/08/2021   Confusion    Anemia 07/09/2021   Weakness 07/09/2021   Fever and chills 07/09/2021   Myalgia 07/09/2021   Influenza A 94/70/9628   Acute metabolic encephalopathy 36/62/9476   AKI (acute kidney injury) ([place]) 07/09/2020   Leukocytosis 07/09/2020   S/P deep brain stimulator placement 07/09/2020   Squamous cell carcinoma, 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 04/24/2020   Heme positive stool 05/27/2016   Alcohol dependence in remission ([place]) 07/31/2013   Cognitive impairment 02/22/2013   History of malignant melanoma of skin 05/15/2012   Parkinson's disease ([place]) 09/28/2011    REFERRING DIAG: Unsteadiness on feet, history of falls  THERAPY DIAG:  Difficulty in walking, not elsewhere classified  Abnormal posture  PERTINENT HISTORY: Keziah Drotar is a 40yoM who is referred to [facility] for on-going imbalance in the setting  of Parkinson's Disease. Pt was an active patient here in Nov 2022, but DC due to hospital admission for Flu in November and another due to encephalopathy in December. Pt working with HHPT/OT in the interim. Pt reports no falls in the past several months, had made efforts to be as safe as possible  PRECAUTIONS: Fall risk  SUBJECTIVE: Pt reports 3/10 LBP. No falls since last visit.    PAIN:  Are you having pain? No     TODAY'S TREATMENT:   03/07/22:  Neuromuscular re-education   Focus of session reassessing short term and long term goals. Pt accomplishing a fair amount of short term goals, and good progress towards long term  goals. Please see goals section and clinical impression for details.   Cues for large movements Improved exercise technique, movement at target joints, use of target muscles after mod verbal, visual, tactile cues.     Response to treatment Pt tolerated session well without aggravation of symptoms.     Clinical impression Pt on 10th visit warranting a progress note. Pt making fair progress towards short term goals. Difficulty completing HEP but accomplishing 6MWT goal and a 2 point decrease in FOTO. Pt making excellent progress towards Long term goals improving DGI by 1 point (limited in improvement as pt relying on AD leading to immediate decrease in score) and vastly improving in 6MWT > 900' with RW with mostly reciprocal gait > 4 minutes of test before gait deviations occurred. Pt had minor set back in FOTO score by 2 points but also improved 5xSTS not requiring UE support completing in 13.3 sec. Pt making excellent progress towards goals. Will benefit from skilled PT services to complete remainder of goals and decrease risk of falls.         PATIENT EDUCATION: Education details: ther-ex, HEP Person educated: Patient Education method: Explanation, Demonstration, Tactile cues, Verbal cues, and Handouts Education comprehension: verbalized understanding and returned demonstration   HOME EXERCISE PROGRAM: Access Code: [email or web address] URL: [email or web address] Date: 12/29/2021 Prepared by: Joneen Boers  Exercises - Standing Hip Abduction with Counter Support  - 1 x daily - 7 x weekly - 2 sets - 10 reps - 5 seconds hold - Sit to Stand with Counter Support  - 1 x daily - 7 x weekly - 2 sets - 5 reps      PT Short Term Goals - 03/07/22 1631       PT SHORT TERM GOAL #1   Title Patient will demonstrate independence with home exercise program for ability to self treat.    Baseline Has been doing his home exercises somewhat. No questions. (02/16/2022); 03/07/22: Performs  every other day    Time 2    Period Weeks    Status Partially Met    Target Date 03/21/22      PT SHORT TERM GOAL #2   Title 6MWT c LRAD >824f -> (+) IADL;    Baseline 3072feval; 500 ft with rw, CGA, cues for increasing foot clearance and step length and staying closer to the rw (02/16/2022): 03/07/2022: 945' with RW    Time 4    Period Weeks    Status Achieved    Target Date 03/07/22      PT SHORT TERM GOAL #3   Title FOTO>10% increase    Baseline Eval: 48; 03/07/22: 46    Time 4    Period Weeks    Status On-going    Target Date 04/04/22      PT SHORT  TERM GOAL #4   Title 5xSTS<13sec s UE chair + airex pad.    Baseline Eval 15sec; 11 seconds with cues for increased speed (02/16/2022);    Time 6    Period Weeks    Status Achieved    Target Date 02/07/22              PT Long Term Goals - 03/07/22 1636       PT LONG TERM GOAL #1   Title Patient will have improved function and activity level as evidenced by an increase in FOTO score by 13 points or more.    Baseline Eval 48; 03/07/22: 46    Time 2    Period Weeks    Status On-going    Target Date 03/21/22      PT LONG TERM GOAL #2   Title 6MWT >1255f c LRAD    Baseline Eval: 3076fin 55m27m; 94567ith RW completing full 6 minutes    Time 8    Period Weeks    Status On-going    Target Date 03/21/22      PT LONG TERM GOAL #3   Title Pt will improve his DGI score with rw to > 19 points as a demonstration of decreased fall risk.    Baseline DGI with rw: 15 (01/03/2022); 03/07/22: 16 relying on RW    Time 2    Period Weeks    Status On-going    Target Date 03/21/22      PT LONG TERM GOAL #4   Title 5xSTS<15sec from chair, s BUE    Baseline eval: requires arms from chair height; 03/07/22: 13.32 sec without UE support    Time 10    Period Weeks    Status Achieved    Target Date 03/07/22             MilSalem Casterairly IV, PT, DPT Physical Therapist- [facility]/24/2023,  5:38 PM

## 2022-03-09 ENCOUNTER — Ambulatory Visit: Payer: Medicare HMO

## 2022-03-09 DIAGNOSIS — R262 Difficulty in walking, not elsewhere classified: Secondary | ICD-10-CM

## 2022-03-09 DIAGNOSIS — R293 Abnormal posture: Secondary | ICD-10-CM

## 2022-03-09 NOTE — Therapy (Signed)
OUTPATIENT PHYSICAL THERAPY TREATMENT NOTE   Patient Name: Wesley Anderson. MRN: [id number] DOB:January 18, 1946, 76 y.o., male Today's Date: 03/09/2022  PCP: Emerson Monte, MD REFERRING PROVIDER: Ramonita Lab III, MD   PT End of Session - 03/09/22 1549     Visit Number 11    Number of Visits 24    Date for PT Re-Evaluation 03/21/22    Authorization Type [employer]    Authorization Time Period 12/27/21-03/21/22    Authorization - Number of Visits 1    Progress Note Due on Visit 10    PT Start Time 2831    PT Stop Time 1629    PT Time Calculation (min) 40 min    Equipment Utilized During Treatment Gait belt    Activity Tolerance Patient tolerated treatment well;No increased pain    Behavior During Therapy Flat affect;WFL for tasks assessed/performed                     Past Medical History:  Diagnosis Date   AAA (abdominal aortic aneurysm) (HCC)    Alcohol dependence in remission ([place])    Cancer ([place])    skin    Colon polyps    ED (erectile dysfunction)    GERD (gastroesophageal reflux disease)    Hematest positive stools    Melanoma of skin (HCC)    Parkinson's disease ([place])    Renal cyst, right    Restless leg syndrome    Past Surgical History:  Procedure Laterality Date   CHOLECYSTECTOMY     COLON SURGERY     colectomy with anastamosis   COLONOSCOPY WITH PROPOFOL N/A 11/07/2016   Procedure: COLONOSCOPY WITH PROPOFOL;  Surgeon: Lollie Sails, MD;  Location: [facility] ENDOSCOPY;  Service: Endoscopy;  Laterality: N/A;   COLONOSCOPY WITH PROPOFOL N/A 09/06/2018   Procedure: COLONOSCOPY WITH PROPOFOL;  Surgeon: Lollie Sails, MD;  Location: [facility] ENDOSCOPY;  Service: Endoscopy;  Laterality: N/A;   COLONOSCOPY WITH PROPOFOL N/A 05/16/2019   Procedure: COLONOSCOPY WITH PROPOFOL;  Surgeon: Lollie Sails, MD;  Location: [facility] ENDOSCOPY;  Service: Endoscopy;  Laterality: N/A;   CYST EXCISION     vocal cord   DBS placement     deep brain implant      ESOPHAGOGASTRODUODENOSCOPY (EGD) WITH PROPOFOL N/A 09/06/2018   Procedure: ESOPHAGOGASTRODUODENOSCOPY (EGD) WITH PROPOFOL;  Surgeon: Lollie Sails, MD;  Location: [facility] ENDOSCOPY;  Service: Endoscopy;  Laterality: N/A;   MELANOMA EXCISION     TONSILLECTOMY     Patient Active Problem List   Diagnosis Date Noted   Acute gastroenteritis 08/08/2021   Sepsis ([place]) 08/08/2021   Confusion    Anemia 07/09/2021   Weakness 07/09/2021   Fever and chills 07/09/2021   Myalgia 07/09/2021   Influenza A 51/76/1607   Acute metabolic encephalopathy 37/05/6268   AKI (acute kidney injury) ([place]) 07/09/2020   Leukocytosis 07/09/2020   S/P deep brain stimulator placement 07/09/2020   Squamous cell carcinoma, 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 04/24/2020   Heme positive stool 05/27/2016   Alcohol dependence in remission ([place]) 07/31/2013   Cognitive impairment 02/22/2013   History of malignant melanoma of skin 05/15/2012   Parkinson's disease ([place]) 09/28/2011    REFERRING DIAG: Unsteadiness on feet, history of falls  THERAPY DIAG:  Difficulty in walking, not elsewhere classified  Abnormal posture  PERTINENT HISTORY: Wesley Anderson is a 76yoM who is referred to [facility] OPPT for on-going imbalance in the setting of Parkinson's Disease. Pt was an active patient  here in Nov 2022, but DC due to hospital admission for Flu in November and another due to encephalopathy in December. Pt working with HHPT/OT in the interim. Pt reports no falls in the past several months, had made efforts to be as safe as possible  PRECAUTIONS: Fall risk  SUBJECTIVE: Back is bothering him. 4/10 currently. Back was fine after session.     PAIN:  Are you having pain? 4/10     TODAY'S TREATMENT:  Neuromuscular re-education  CGA with standing tasks   Nustep seat 11 (to increase amplitude of LE movement, arms 11, level 2 for 4 minutes. To maintain at least 90+ SPM  Warm down x 2 minutes.   Performed to promote  alternating reciprocal large movements  Standing with B UE assist  Hip flexion with extension    R 10x   L 10x  Standing with upright posture   B scapular retraction and knee extension to help decrease flexed position. 10x with 5 second holds  Then 10x10 seconds    Sitting with B feet propped on stool to stretch B hamstrings to improve knee extension while standing. 2 minutes x 2     Static lateral lunge with B UE assist  R 10x2  L 10x2  Static forward lunge with one UE assist   R 10x2  L 10x2  Gait with rw, cues for increasing step length, and hip flexion 200 ft, then 100 ft  Cues for increased hip and knee flexion to promote clearance  Cues to decrease wideness of turns so as to not hit obstacles.     Cues for large movements Improved exercise technique, movement at target joints, use of target muscles after mod verbal, visual, tactile cues.     Response to treatment Good tolerance to today's session.     Clinical impression  Difficulty with L LE control and more static tremors observed today. Difficulty with upright posture and trunk and LE extension. Continued working on extension exercises to help address. Also continued working on reciprocal alternating movement, and increasing amplitude of movement to decrease fall risk.  Pt will benefit from continued skilled physical therapy services to improve the aforementioned deficits.        PATIENT EDUCATION: Education details: ther-ex, HEP Person educated: Patient Education method: Explanation, Demonstration, Tactile cues, Verbal cues, and Handouts Education comprehension: verbalized understanding and returned demonstration   HOME EXERCISE PROGRAM: Access Code: [email or web address] URL: [email or web address] Date: 12/29/2021 Prepared by: Joneen Boers  Exercises - Standing Hip Abduction with Counter Support  - 1 x daily - 7 x weekly - 2 sets - 10 reps - 5 seconds hold - Sit to Stand with Counter Support   - 1 x daily - 7 x weekly - 2 sets - 5 reps      PT Short Term Goals - 03/07/22 1631       PT SHORT TERM GOAL #1   Title Patient will demonstrate independence with home exercise program for ability to self treat.    Baseline Has been doing his home exercises somewhat. No questions. (02/16/2022); 03/07/22: Performs every other day    Time 2    Period Weeks    Status Partially Met    Target Date 03/21/22      PT SHORT TERM GOAL #2   Title 6MWT c LRAD >870ft -> (+) IADL;    Baseline 363ft eval; 500 ft with rw, CGA, cues for increasing foot clearance and step length and staying closer to the rw (  02/16/2022): 03/07/2022: 945' with RW    Time 4    Period Weeks    Status Achieved    Target Date 03/07/22      PT SHORT TERM GOAL #3   Title FOTO>10% increase    Baseline Eval: 48; 03/07/22: 46    Time 4    Period Weeks    Status On-going    Target Date 04/04/22      PT SHORT TERM GOAL #4   Title 5xSTS<13sec s UE chair + airex pad.    Baseline Eval 15sec; 11 seconds with cues for increased speed (02/16/2022);    Time 6    Period Weeks    Status Achieved    Target Date 02/07/22              PT Long Term Goals - 03/07/22 1636       PT LONG TERM GOAL #1   Title Patient will have improved function and activity level as evidenced by an increase in FOTO score by 13 points or more.    Baseline Eval 48; 03/07/22: 46    Time 2    Period Weeks    Status On-going    Target Date 03/21/22      PT LONG TERM GOAL #2   Title 6MWT >1233ft c LRAD    Baseline Eval: 346ft in 69m43s; 26' with RW completing full 6 minutes    Time 8    Period Weeks    Status On-going    Target Date 03/21/22      PT LONG TERM GOAL #3   Title Pt will improve his DGI score with rw to > 19 points as a demonstration of decreased fall risk.    Baseline DGI with rw: 15 (01/03/2022); 03/07/22: 16 relying on RW    Time 2    Period Weeks    Status On-going    Target Date 03/21/22      PT LONG TERM GOAL #4   Title  5xSTS<15sec from chair, s BUE    Baseline eval: requires arms from chair height; 03/07/22: 13.32 sec without UE support    Time 10    Period Weeks    Status Achieved    Target Date 03/07/22              Plan - 03/09/22 1548     Clinical Impression Statement Difficulty with L LE control and more static tremors observed today. Difficulty with upright posture and trunk and LE extension. Continued working on extension exercises to help address. Also continued working on reciprocal alternating movement, and increasing amplitude of movement to decrease fall risk.  Pt will benefit from continued skilled physical therapy services to improve the aforementioned deficits.    Personal Factors and Comorbidities Age;Past/Current Experience    Comorbidities PD, H/o cancer,    Examination-Activity Limitations Squat;Stairs;Stand;Locomotion Level;Reach Overhead    Examination-Participation Restrictions Cleaning;Medication Management;Interpersonal Relationship;Community Activity    Stability/Clinical Decision Making Evolving/Moderate complexity    Rehab Potential Fair    PT Frequency 2x / week    PT Duration 8 weeks    PT Treatment/Interventions ADLs/Self Care Home Management;Neuromuscular re-education;Balance training;Therapeutic exercise;Orthotic Fit/Training;Prosthetic Training;Patient/family education;Wheelchair mobility training;Manual techniques;Passive range of motion;Energy conservation;Vestibular;Joint Manipulations;Gait training;Therapeutic activities;Functional mobility training;Stair training    PT Next Visit Plan --    PT Home Exercise Plan --    Consulted and Agree with Plan of Care Patient  Joneen Boers PT, DPT  03/09/2022, 6:37 PM

## 2022-03-14 ENCOUNTER — Ambulatory Visit: Payer: Medicare HMO

## 2022-03-14 DIAGNOSIS — R293 Abnormal posture: Secondary | ICD-10-CM

## 2022-03-14 DIAGNOSIS — R262 Difficulty in walking, not elsewhere classified: Secondary | ICD-10-CM | POA: Diagnosis not present

## 2022-03-14 NOTE — Therapy (Signed)
OUTPATIENT PHYSICAL THERAPY TREATMENT NOTE   Patient Name: Wesley Anderson. MRN: [id number] DOB:Jan 09, 1946, 76 y.o., male Today's Date: 03/14/2022  PCP: Emerson Monte, MD REFERRING PROVIDER: Ramonita Lab III, MD   PT End of Session - 03/14/22 1635     Visit Number 12    Number of Visits 24    Date for PT Re-Evaluation 03/21/22    Authorization Type [employer] Medicare    Authorization Time Period 12/27/21-03/21/22    Authorization - Number of Visits 2    Progress Note Due on Visit 10    PT Start Time 0254    PT Stop Time 2706    PT Time Calculation (min) 40 min    Equipment Utilized During Treatment Gait belt    Activity Tolerance Patient tolerated treatment well;No increased pain    Behavior During Therapy Flat affect;WFL for tasks assessed/performed                      Past Medical History:  Diagnosis Date   AAA (abdominal aortic aneurysm) (HCC)    Alcohol dependence in remission ([place])    Cancer ([place])    skin    Colon polyps    ED (erectile dysfunction)    GERD (gastroesophageal reflux disease)    Hematest positive stools    Melanoma of skin (HCC)    Parkinson's disease ([place])    Renal cyst, right    Restless leg syndrome    Past Surgical History:  Procedure Laterality Date   CHOLECYSTECTOMY     COLON SURGERY     colectomy with anastamosis   COLONOSCOPY WITH PROPOFOL N/A 11/07/2016   Procedure: COLONOSCOPY WITH PROPOFOL;  Surgeon: Lollie Sails, MD;  Location: [facility] ENDOSCOPY;  Service: Endoscopy;  Laterality: N/A;   COLONOSCOPY WITH PROPOFOL N/A 09/06/2018   Procedure: COLONOSCOPY WITH PROPOFOL;  Surgeon: Lollie Sails, MD;  Location: [facility] ENDOSCOPY;  Service: Endoscopy;  Laterality: N/A;   COLONOSCOPY WITH PROPOFOL N/A 05/16/2019   Procedure: COLONOSCOPY WITH PROPOFOL;  Surgeon: Lollie Sails, MD;  Location: [facility] ENDOSCOPY;  Service: Endoscopy;  Laterality: N/A;   CYST EXCISION     vocal cord   DBS placement     deep brain implant      ESOPHAGOGASTRODUODENOSCOPY (EGD) WITH PROPOFOL N/A 09/06/2018   Procedure: ESOPHAGOGASTRODUODENOSCOPY (EGD) WITH PROPOFOL;  Surgeon: Lollie Sails, MD;  Location: [facility] ENDOSCOPY;  Service: Endoscopy;  Laterality: N/A;   MELANOMA EXCISION     TONSILLECTOMY     Patient Active Problem List   Diagnosis Date Noted   Acute gastroenteritis 08/08/2021   Sepsis ([place]) 08/08/2021   Confusion    Anemia 07/09/2021   Weakness 07/09/2021   Fever and chills 07/09/2021   Myalgia 07/09/2021   Influenza A 23/76/2831   Acute metabolic encephalopathy 51/76/1607   AKI (acute kidney injury) ([place]) 07/09/2020   Leukocytosis 07/09/2020   S/P deep brain stimulator placement 07/09/2020   Squamous cell carcinoma, 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 04/24/2020   Heme positive stool 05/27/2016   Alcohol dependence in remission ([place]) 07/31/2013   Cognitive impairment 02/22/2013   History of malignant melanoma of skin 05/15/2012   Parkinson's disease ([place]) 09/28/2011    REFERRING DIAG: Unsteadiness on feet, history of falls  THERAPY DIAG:  Difficulty in walking, not elsewhere classified  Abnormal posture  PERTINENT HISTORY: Wesley Anderson is a 38yoM who is referred to [facility] OPPT for on-going imbalance in the setting of Parkinson's Disease. Pt was an active  patient here in Nov 2022, but DC due to hospital admission for Flu in November and another due to encephalopathy in December. Pt working with HHPT/OT in the interim. Pt reports no falls in the past several months, had made efforts to be as safe as possible  PRECAUTIONS: Fall risk  SUBJECTIVE: Back has not been bothering him today.    PAIN:  Are you having pain? 0/10     TODAY'S TREATMENT:  Neuromuscular re-education  CGA with standing tasks   Nustep seat 11 (to increase amplitude of LE movement, arms 11, level 2 for 4 minutes. To maintain at least 90+ SPM  Warm down x 2 minutes.   Performed to promote alternating reciprocal large  movements  Sitting with B feet propped on stool to stretch B hamstrings to improve knee extension while standing. 2 minutes  Gait with rw, cues for increasing step length, and hip flexion 300 ft  Improved step length and foot clearance compared to previous session  Compensatory stepping correction- forward 5x                 Able to step a few steps then stop. Improved steadiness.            Forward step up onto Air Ex pad with B UE assist   L 10x  R 10x    Standing with B feet spread apart    B shoulder horizontal abduction with B scapular retraction 10x3    Then with R and L trunk rotation with arms in abduction 10x3   To promote large movements.    Static lateral lunge with B UE assist  R 10x  L 10x       Cues for large movements Improved exercise technique, movement at target joints, use of target muscles after mod verbal, visual, tactile cues.     Response to treatment Good tolerance to today's session. No complain of pain.     Clinical impression  Improved step length and foot clearance with gait today with rw. Continued working on reciprocal as well as large movements and compensatory stepping correction to decrease fall risk. Pt tolerated session well without aggravation of symptoms. Pt will benefit from continued skilled physical therapy services to improve the aforementioned deficits.        PATIENT EDUCATION: Education details: ther-ex, HEP Person educated: Patient Education method: Explanation, Demonstration, Tactile cues, Verbal cues, and Handouts Education comprehension: verbalized understanding and returned demonstration   HOME EXERCISE PROGRAM: Access Code: [email or web address] URL: [email or web address] Date: 12/29/2021 Prepared by: Joneen Boers  Exercises - Standing Hip Abduction with Counter Support  - 1 x daily - 7 x weekly - 2 sets - 10 reps - 5 seconds hold - Sit to Stand with Counter Support  - 1 x daily - 7 x weekly - 2 sets  - 5 reps      PT Short Term Goals - 03/07/22 1631       PT SHORT TERM GOAL #1   Title Patient will demonstrate independence with home exercise program for ability to self treat.    Baseline Has been doing his home exercises somewhat. No questions. (02/16/2022); 03/07/22: Performs every other day    Time 2    Period Weeks    Status Partially Met    Target Date 03/21/22      PT SHORT TERM GOAL #2   Title 6MWT c LRAD >865f -> (+) IADL;    Baseline 3069feval; 500 ft with rw, CGA, cues  for increasing foot clearance and step length and staying closer to the rw (02/16/2022): 03/07/2022: 945' with RW    Time 4    Period Weeks    Status Achieved    Target Date 03/07/22      PT SHORT TERM GOAL #3   Title FOTO>10% increase    Baseline Eval: 48; 03/07/22: 46    Time 4    Period Weeks    Status On-going    Target Date 04/04/22      PT SHORT TERM GOAL #4   Title 5xSTS<13sec s UE chair + airex pad.    Baseline Eval 15sec; 11 seconds with cues for increased speed (02/16/2022);    Time 6    Period Weeks    Status Achieved    Target Date 02/07/22              PT Long Term Goals - 03/07/22 1636       PT LONG TERM GOAL #1   Title Patient will have improved function and activity level as evidenced by an increase in FOTO score by 13 points or more.    Baseline Eval 48; 03/07/22: 46    Time 2    Period Weeks    Status On-going    Target Date 03/21/22      PT LONG TERM GOAL #2   Title 6MWT >1229f c LRAD    Baseline Eval: 3080fin 39m61m; 94535ith RW completing full 6 minutes    Time 8    Period Weeks    Status On-going    Target Date 03/21/22      PT LONG TERM GOAL #3   Title Pt will improve his DGI score with rw to > 19 points as a demonstration of decreased fall risk.    Baseline DGI with rw: 15 (01/03/2022); 03/07/22: 16 relying on RW    Time 2    Period Weeks    Status On-going    Target Date 03/21/22      PT LONG TERM GOAL #4   Title 5xSTS<15sec from chair, s BUE     Baseline eval: requires arms from chair height; 03/07/22: 13.32 sec without UE support    Time 10    Period Weeks    Status Achieved    Target Date 03/07/22              Plan - 03/14/22 1639     Clinical Impression Statement Improved step length and foot clearance with gait today with rw. Continued working on reciprocal as well as large movements and compensatory stepping correction to decrease fall risk. Pt tolerated session well without aggravation of symptoms. Pt will benefit from continued skilled physical therapy services to improve the aforementioned deficits.    Personal Factors and Comorbidities Age;Past/Current Experience    Comorbidities PD, H/o cancer,    Examination-Activity Limitations Squat;Stairs;Stand;Locomotion Level;Reach Overhead    Examination-Participation Restrictions Cleaning;Medication Management;Interpersonal Relationship;Community Activity    Stability/Clinical Decision Making Stable/Uncomplicated    Rehab Potential Fair    PT Frequency 2x / week    PT Duration 8 weeks    PT Treatment/Interventions ADLs/Self Care Home Management;Neuromuscular re-education;Balance training;Therapeutic exercise;Orthotic Fit/Training;Prosthetic Training;Patient/family education;Wheelchair mobility training;Manual techniques;Passive range of motion;Energy conservation;Vestibular;Joint Manipulations;Gait training;Therapeutic activities;Functional mobility training;Stair training    Consulted and Agree with Plan of Care Patient                     MigJoneen Boers, DPT  03/14/2022, 5:21 PM

## 2022-03-16 ENCOUNTER — Ambulatory Visit: Payer: Medicare HMO | Attending: Internal Medicine

## 2022-03-16 DIAGNOSIS — R293 Abnormal posture: Secondary | ICD-10-CM | POA: Insufficient documentation

## 2022-03-16 DIAGNOSIS — R42 Dizziness and giddiness: Secondary | ICD-10-CM | POA: Insufficient documentation

## 2022-03-16 DIAGNOSIS — R262 Difficulty in walking, not elsewhere classified: Secondary | ICD-10-CM | POA: Diagnosis present

## 2022-03-16 NOTE — Therapy (Signed)
OUTPATIENT PHYSICAL THERAPY TREATMENT NOTE   Patient Name: Wesley Anderson. MRN: [id number] DOB:[birth year], 76 y.o., male Today's Date: 03/16/2022  PCP: Emerson Monte, MD REFERRING PROVIDER: Ramonita Lab III, MD   PT End of Session - 03/16/22 1415     Visit Number 13    Number of Visits 24    Date for PT Re-Evaluation 03/21/22    Authorization Type [employer]    Authorization Time Period 12/27/21-03/21/22    Authorization - Number of Visits 3    Progress Note Due on Visit 10    PT Start Time 1886    PT Stop Time 1459    PT Time Calculation (min) 44 min    Equipment Utilized During Treatment Gait belt    Activity Tolerance Patient tolerated treatment well;No increased pain    Behavior During Therapy Flat affect;WFL for tasks assessed/performed                       Past Medical History:  Diagnosis Date   AAA (abdominal aortic aneurysm) (HCC)    Alcohol dependence in remission ([place])    Cancer ([place])    skin    Colon polyps    ED (erectile dysfunction)    GERD (gastroesophageal reflux disease)    Hematest positive stools    Melanoma of skin (HCC)    Parkinson's disease ([place])    Renal cyst, right    Restless leg syndrome    Past Surgical History:  Procedure Laterality Date   CHOLECYSTECTOMY     COLON SURGERY     colectomy with anastamosis   COLONOSCOPY WITH PROPOFOL N/A 11/07/2016   Procedure: COLONOSCOPY WITH PROPOFOL;  Surgeon: Lollie Sails, MD;  Location: [facility] ENDOSCOPY;  Service: Endoscopy;  Laterality: N/A;   COLONOSCOPY WITH PROPOFOL N/A 09/06/2018   Procedure: COLONOSCOPY WITH PROPOFOL;  Surgeon: Lollie Sails, MD;  Location: [facility] ENDOSCOPY;  Service: Endoscopy;  Laterality: N/A;   COLONOSCOPY WITH PROPOFOL N/A 05/16/2019   Procedure: COLONOSCOPY WITH PROPOFOL;  Surgeon: Lollie Sails, MD;  Location: [facility] ENDOSCOPY;  Service: Endoscopy;  Laterality: N/A;   CYST EXCISION     vocal cord   DBS placement     deep brain implant      ESOPHAGOGASTRODUODENOSCOPY (EGD) WITH PROPOFOL N/A 09/06/2018   Procedure: ESOPHAGOGASTRODUODENOSCOPY (EGD) WITH PROPOFOL;  Surgeon: Lollie Sails, MD;  Location: [facility] ENDOSCOPY;  Service: Endoscopy;  Laterality: N/A;   MELANOMA EXCISION     TONSILLECTOMY     Patient Active Problem List   Diagnosis Date Noted   Acute gastroenteritis 08/08/2021   Sepsis ([place]) 08/08/2021   Confusion    Anemia 07/09/2021   Weakness 07/09/2021   Fever and chills 07/09/2021   Myalgia 07/09/2021   Influenza A 77/37/3668   Acute metabolic encephalopathy 15/94/7076   AKI (acute kidney injury) ([place]) 07/09/2020   Leukocytosis 07/09/2020   S/P deep brain stimulator placement 07/09/2020   Squamous cell carcinoma, 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 04/24/2020   Heme positive stool 05/27/2016   Alcohol dependence in remission ([place]) 07/31/2013   Cognitive impairment 02/22/2013   History of malignant melanoma of skin 05/15/2012   Parkinson's disease ([place]) 09/28/2011    REFERRING DIAG: Unsteadiness on feet, history of falls  THERAPY DIAG:  Difficulty in walking, not elsewhere classified  Abnormal posture  PERTINENT HISTORY: Wesley Anderson is a 60yoM who is referred to [facility] OPPT for on-going imbalance in the setting of Parkinson's Disease. Pt was an  active patient here in Nov 2022, but DC due to hospital admission for Flu in November and another due to encephalopathy in December. Pt working with HHPT/OT in the interim. Pt reports no falls in the past several months, had made efforts to be as safe as possible  PRECAUTIONS: Fall risk  SUBJECTIVE: Back is better. Just the brain.    PAIN:  Are you having pain? 0/10     TODAY'S TREATMENT:  Neuromuscular re-education  CGA with standing tasks  (Upgraded NuStep time to 5 minutes 03/16/2022) Nustep seat 11 (to increase amplitude of LE movement, arms 11, level 2 for 5 minutes. To maintain at least 90+ SPM  Warm down x 2 minutes.   Performed to promote  alternating reciprocal large movements  Standing with B feet spread apart    B shoulder horizontal abduction with B scapular retraction 10x3    Then with R and L trunk rotation with arms in abduction 10x3   To promote large movements.    Gait without AD, CGA 40 ft x 3, cues for increasing hip and knee flexion for foot clearance.   Able to perform safely and no LOB   Stepping over 2 mini hurdles, no UE assist, but CGA from PT 6x. LOB 2x  Cues for placing center of gravity over base of support (foot)  Forward step up onto first regular step with B UE assist   Alternating LE:  R 10x  L 10x    Cues for large movements Improved exercise technique, movement at target joints, use of target muscles after mod verbal, visual, tactile cues.     Response to treatment Good tolerance to today's session. No complain of pain.     Clinical impression  Improving foot clearance and step length, decreased festinating gait pattern observed today. Able to ambulate on firm surface without obstacles, without AD no LOB today, about 40 ft each. Continued working on reciprocal as well as large movements to decrease fall risk. Pt tolerated session well without aggravation of symptoms. Pt will benefit from continued skilled physical therapy services to improve the aforementioned deficits.        PATIENT EDUCATION: Education details: ther-ex, HEP Person educated: Patient Education method: Explanation, Demonstration, Tactile cues, Verbal cues, and Handouts Education comprehension: verbalized understanding and returned demonstration   HOME EXERCISE PROGRAM: Access Code: [email or web address] URL: [email or web address] Date: 12/29/2021 Prepared by: Joneen Boers  Exercises - Standing Hip Abduction with Counter Support  - 1 x daily - 7 x weekly - 2 sets - 10 reps - 5 seconds hold - Sit to Stand with Counter Support  - 1 x daily - 7 x weekly - 2 sets - 5 reps      PT Short Term Goals -  03/07/22 1631       PT SHORT TERM GOAL #1   Title Patient will demonstrate independence with home exercise program for ability to self treat.    Baseline Has been doing his home exercises somewhat. No questions. (02/16/2022); 03/07/22: Performs every other day    Time 2    Period Weeks    Status Partially Met    Target Date 03/21/22      PT SHORT TERM GOAL #2   Title 6MWT c LRAD >853ft -> (+) IADL;    Baseline 381ft eval; 500 ft with rw, CGA, cues for increasing foot clearance and step length and staying closer to the rw (02/16/2022): 03/07/2022: 945' with RW    Time 4  Period Weeks    Status Achieved    Target Date 03/07/22      PT SHORT TERM GOAL #3   Title FOTO>10% increase    Baseline Eval: 48; 03/07/22: 46    Time 4    Period Weeks    Status On-going    Target Date 04/04/22      PT SHORT TERM GOAL #4   Title 5xSTS<13sec s UE chair + airex pad.    Baseline Eval 15sec; 11 seconds with cues for increased speed (02/16/2022);    Time 6    Period Weeks    Status Achieved    Target Date 02/07/22              PT Long Term Goals - 03/07/22 1636       PT LONG TERM GOAL #1   Title Patient will have improved function and activity level as evidenced by an increase in FOTO score by 13 points or more.    Baseline Eval 48; 03/07/22: 46    Time 2    Period Weeks    Status On-going    Target Date 03/21/22      PT LONG TERM GOAL #2   Title 6MWT >1280ft c LRAD    Baseline Eval: 380ft in 11m43s; 42' with RW completing full 6 minutes    Time 8    Period Weeks    Status On-going    Target Date 03/21/22      PT LONG TERM GOAL #3   Title Pt will improve his DGI score with rw to > 19 points as a demonstration of decreased fall risk.    Baseline DGI with rw: 15 (01/03/2022); 03/07/22: 16 relying on RW    Time 2    Period Weeks    Status On-going    Target Date 03/21/22      PT LONG TERM GOAL #4   Title 5xSTS<15sec from chair, s BUE    Baseline eval: requires arms from chair  height; 03/07/22: 13.32 sec without UE support    Time 10    Period Weeks    Status Achieved    Target Date 03/07/22              Plan - 03/16/22 1409     Clinical Impression Statement Improving foot clearance and step length, decreased festinating gait pattern observed today. Able to ambulate on firm surface without obstacles, without AD no LOB today, about 40 ft each. Continued working on reciprocal as well as large movements to decrease fall risk. Pt tolerated session well without aggravation of symptoms. Pt will benefit from continued skilled physical therapy services to improve the aforementioned deficits.    Personal Factors and Comorbidities Age;Past/Current Experience    Comorbidities PD, H/o cancer,    Examination-Activity Limitations Squat;Stairs;Stand;Locomotion Level;Reach Overhead    Examination-Participation Restrictions Cleaning;Medication Management;Interpersonal Relationship;Community Activity    Stability/Clinical Decision Making Stable/Uncomplicated    Rehab Potential Fair    PT Frequency 2x / week    PT Duration 8 weeks    PT Treatment/Interventions ADLs/Self Care Home Management;Neuromuscular re-education;Balance training;Therapeutic exercise;Orthotic Fit/Training;Prosthetic Training;Patient/family education;Wheelchair mobility training;Manual techniques;Passive range of motion;Energy conservation;Vestibular;Joint Manipulations;Gait training;Therapeutic activities;Functional mobility training;Stair training    Consulted and Agree with Plan of Care Patient                      Joneen Boers PT, DPT  03/16/2022, 5:37 PM

## 2022-03-21 ENCOUNTER — Ambulatory Visit: Payer: Medicare HMO

## 2022-03-23 ENCOUNTER — Ambulatory Visit: Payer: Medicare HMO

## 2022-03-23 DIAGNOSIS — R262 Difficulty in walking, not elsewhere classified: Secondary | ICD-10-CM

## 2022-03-23 DIAGNOSIS — R293 Abnormal posture: Secondary | ICD-10-CM

## 2022-03-23 NOTE — Therapy (Signed)
OUTPATIENT PHYSICAL THERAPY TREATMENT NOTE   Patient Name: Wesley Anderson. MRN: [id number] DOB:[birth year], 76 y.o., male Today's Date: 03/23/2022  PCP: Emerson Monte, MD REFERRING PROVIDER: Ramonita Lab III, MD   PT End of Session - 03/23/22 1503     Visit Number 14    Number of Visits 40    Date for PT Re-Evaluation 05/19/22    Authorization Type [employer] Medicare    Authorization Time Period 12/27/21-03/21/22    Authorization - Number of Visits 4    Progress Note Due on Visit 10    PT Start Time 1503    PT Stop Time 1535    PT Time Calculation (min) 32 min    Equipment Utilized During Treatment Gait belt    Activity Tolerance Patient tolerated treatment well;No increased pain    Behavior During Therapy Flat affect;WFL for tasks assessed/performed                        Past Medical History:  Diagnosis Date   AAA (abdominal aortic aneurysm) (HCC)    Alcohol dependence in remission ([place])    Cancer ([place])    skin    Colon polyps    ED (erectile dysfunction)    GERD (gastroesophageal reflux disease)    Hematest positive stools    Melanoma of skin (HCC)    Parkinson's disease ([place])    Renal cyst, right    Restless leg syndrome    Past Surgical History:  Procedure Laterality Date   CHOLECYSTECTOMY     COLON SURGERY     colectomy with anastamosis   COLONOSCOPY WITH PROPOFOL N/A 11/07/2016   Procedure: COLONOSCOPY WITH PROPOFOL;  Surgeon: Lollie Sails, MD;  Location: [facility] ENDOSCOPY;  Service: Endoscopy;  Laterality: N/A;   COLONOSCOPY WITH PROPOFOL N/A 09/06/2018   Procedure: COLONOSCOPY WITH PROPOFOL;  Surgeon: Lollie Sails, MD;  Location: [facility] ENDOSCOPY;  Service: Endoscopy;  Laterality: N/A;   COLONOSCOPY WITH PROPOFOL N/A 05/16/2019   Procedure: COLONOSCOPY WITH PROPOFOL;  Surgeon: Lollie Sails, MD;  Location: [facility] ENDOSCOPY;  Service: Endoscopy;  Laterality: N/A;   CYST EXCISION     vocal cord   DBS placement     deep brain implant      ESOPHAGOGASTRODUODENOSCOPY (EGD) WITH PROPOFOL N/A 09/06/2018   Procedure: ESOPHAGOGASTRODUODENOSCOPY (EGD) WITH PROPOFOL;  Surgeon: Lollie Sails, MD;  Location: [facility] ENDOSCOPY;  Service: Endoscopy;  Laterality: N/A;   MELANOMA EXCISION     TONSILLECTOMY     Patient Active Problem List   Diagnosis Date Noted   Acute gastroenteritis 08/08/2021   Sepsis ([place]) 08/08/2021   Confusion    Anemia 07/09/2021   Weakness 07/09/2021   Fever and chills 07/09/2021   Myalgia 07/09/2021   Influenza A 24/23/5361   Acute metabolic encephalopathy 44/31/5400   AKI (acute kidney injury) ([place]) 07/09/2020   Leukocytosis 07/09/2020   S/P deep brain stimulator placement 07/09/2020   Squamous cell carcinoma, 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 04/24/2020   Heme positive stool 05/27/2016   Alcohol dependence in remission ([place]) 07/31/2013   Cognitive impairment 02/22/2013   History of malignant melanoma of skin 05/15/2012   Parkinson's disease ([place]) 09/28/2011    REFERRING DIAG: Unsteadiness on feet, history of falls  THERAPY DIAG:  Difficulty in walking, not elsewhere classified - Plan: PT plan of care cert/re-cert  Abnormal posture - Plan: PT plan of care cert/re-cert  PERTINENT HISTORY: Wesley Anderson is a 71yoM who is referred  to [facility] OPPT for on-going imbalance in the setting of Parkinson's Disease. Pt was an active patient here in Nov 2022, but DC due to hospital admission for Flu in November and another due to encephalopathy in December. Pt working with HHPT/OT in the interim. Pt reports no falls in the past several months, had made efforts to be as safe as possible  PRECAUTIONS: Fall risk  SUBJECTIVE: Doing good. No pain, just the brain.    PAIN:  Are you having pain? 0/10     TODAY'S TREATMENT:  Neuromuscular re-education  CGA with standing tasks   Nustep seat 11 (to increase amplitude of LE movement, arms 11, level 2 for 5 minutes. To maintain at least 90+ SPM  Warm down x 2  minutes.   Performed to promote alternating reciprocal large movements  Gait without AD 20 ft CGA   Directed patient with gait with normal gait speed, with changes in speed, 180 degree pivot turn, with R and L cervical rotation position, with cervical flexion and extension position, stepping around obstacles, stepping over an obstacle, ascending and descending 4 regular steps with UE assist   Able to perform DGI today without use of rw today  Stepping over shoe box without UE assist   L 2x  R 2x. LOB 1x secondary to R LE stance weakness.   Reviewed POC 2x/week for 8 weeks  Session stopped early to allow pt to fill out FOTO survey.   Cues for large movements Improved exercise technique, movement at target joints, use of target muscles after mod verbal, visual, tactile cues.     Response to treatment Good tolerance to today's session. No complain of pain.     Clinical impression   Pt demonstrates improved gait distance using his rw in 6 minutes, improved functional LE strength since initial evaluation. Able to perform all of DGI balance test today without use of rw but with CGA. Decreased DGI score today secondary to LOB stepping over obstacle. Pt utilized rw before, but not today which is an improvement. Pt cleared the obstacle on the first try, but not the second try, therefore scoring 0. Pt also able to ambulate multiple times throughout session without use of AD but with improved step length and foot clearance after cues. Still demonstrates B hamstrings tightness and B glute med weakness resulting and lateral lean, R LE single leg stance weakness as well as decreased B knee extension during stance phase of gait. Overall, pt is making very good progress with PT towards goals. Pt will benefit from continued skilled physical therapy services to improve the aforementioned deficits.        PATIENT EDUCATION: Education details: ther-ex, HEP Person educated: Patient Education  method: Explanation, Demonstration, Tactile cues, Verbal cues, and Handouts Education comprehension: verbalized understanding and returned demonstration   HOME EXERCISE PROGRAM: Access Code: [email or web address] URL: [email or web address] Date: 12/29/2021 Prepared by: Joneen Boers  Exercises - Standing Hip Abduction with Counter Support  - 1 x daily - 7 x weekly - 2 sets - 10 reps - 5 seconds hold - Sit to Stand with Counter Support  - 1 x daily - 7 x weekly - 2 sets - 5 reps      PT Short Term Goals - 03/23/22 1506       PT SHORT TERM GOAL #1   Title Patient will demonstrate independence with home exercise program for ability to self treat.    Baseline Has been doing his home exercises somewhat. No questions. (02/16/2022);  03/07/22: Performs every other day; Performing his HEP, no questions, seems to be doing him some good per pt (03/23/2022)    Time 2    Period Weeks    Status Achieved    Target Date 03/21/22      PT SHORT TERM GOAL #2   Title 6MWT c LRAD >848f -> (+) IADL;    Baseline 3071feval; 500 ft with rw, CGA, cues for increasing foot clearance and step length and staying closer to the rw (02/16/2022): 03/07/2022: 945' with RW    Time 4    Period Weeks    Status Achieved    Target Date 03/07/22      PT SHORT TERM GOAL #3   Title FOTO>10% increase    Baseline Eval: 48; 03/07/22: 46; 50 (03/23/2022)    Time 4    Period Weeks    Status On-going    Target Date 04/04/22      PT SHORT TERM GOAL #4   Title 5xSTS<13sec s UE chair + airex pad.    Baseline Eval 15sec; 11 seconds with cues for increased speed (02/16/2022);    Time 6    Period Weeks    Status Achieved    Target Date 02/07/22              PT Long Term Goals - 03/23/22 1546       PT LONG TERM GOAL #1   Title Patient will have improved function and activity level as evidenced by an increase in FOTO score by 13 points or more.    Baseline Eval 48; 03/07/22: 46; 50 (03/23/2022)    Time 2    Period Weeks     Status On-going    Target Date 05/19/22      PT LONG TERM GOAL #2   Title 6MWT >120047f LRAD    Baseline Eval: [id number] [id number]' 56h RW completing full 6 minutes    Time 8    Period Weeks    Status On-going    Target Date 05/19/22      PT LONG TERM GOAL #3   Title Pt will improve his DGI score with rw to > 19 points as a demonstration of decreased fall risk.    Baseline DGI with rw: 15 (01/03/2022); 03/07/22: 16 relying on RW; 15, did not use RW today (03/23/2022)    Time 8    Period Weeks    Status On-going    Target Date 05/19/22      PT LONG TERM GOAL #4   Title 5xSTS<15sec from chair, s BUE    Baseline eval: requires arms from chair height; 03/07/22: 13.32 sec without UE support    Time 10    Period Weeks    Status Achieved    Target Date 03/07/22              Plan - 03/23/22 1502     Clinical Impression Statement Pt demonstrates improved gait distance using his rw in 6 minutes, improved functional LE strength since initial evaluation. Able to perform all of DGI balance test today without use of rw but with CGA. Decreased DGI score today secondary to LOB stepping over obstacle. Pt utilized rw before, but not today which is an improvement. Pt cleared the obstacle on the first try, but not the second try, therefore scoring 0. Pt also able to ambulate multiple times throughout session without use of AD but with improved step length and foot clearance after cues.  Still demonstrates B hamstrings tightness and B glute med weakness resulting and lateral lean, R LE single leg stance weakness as well as decreased B knee extension during stance phase of gait. Overall, pt is making very good progress with PT towards goals. Pt will benefit from continued skilled physical therapy services to improve the aforementioned deficits.    Personal Factors and Comorbidities Age;Past/Current Experience    Comorbidities PD, H/o cancer,    Examination-Activity Limitations  Squat;Stairs;Stand;Locomotion Level;Reach Overhead    Examination-Participation Restrictions Cleaning;Medication Management;Interpersonal Relationship;Community Activity    Stability/Clinical Decision Making Stable/Uncomplicated    Clinical Decision Making Low    Rehab Potential Fair    PT Frequency 2x / week    PT Duration 8 weeks    PT Treatment/Interventions ADLs/Self Care Home Management;Neuromuscular re-education;Balance training;Therapeutic exercise;Orthotic Fit/Training;Prosthetic Training;Patient/family education;Wheelchair mobility training;Manual techniques;Passive range of motion;Energy conservation;Vestibular;Joint Manipulations;Gait training;Therapeutic activities;Functional mobility training;Stair training    Consulted and Agree with Plan of Care Patient                       Joneen Boers PT, DPT  03/23/2022, 6:22 PM

## 2022-03-28 ENCOUNTER — Ambulatory Visit: Payer: Medicare HMO

## 2022-03-28 DIAGNOSIS — R293 Abnormal posture: Secondary | ICD-10-CM

## 2022-03-28 DIAGNOSIS — R262 Difficulty in walking, not elsewhere classified: Secondary | ICD-10-CM

## 2022-03-28 NOTE — Therapy (Signed)
OUTPATIENT PHYSICAL THERAPY TREATMENT NOTE   Patient Name: Wesley Anderson. MRN: [id number] DOB:[birth year], 76 y.o., male Today's Date: 03/28/2022  PCP: Wesley Monte, MD REFERRING PROVIDER: Ramonita Lab III, MD   PT End of Session - 03/28/22 1429     Visit Number 15    Number of Visits 40    Date for PT Re-Evaluation 05/19/22    Authorization Type [employer]    Authorization Time Period 12/27/21-03/21/22    Authorization - Number of Visits 4    Progress Note Due on Visit 10    PT Start Time 1424   pt late to session   PT Stop Time 1500    PT Time Calculation (min) 36 min    Equipment Utilized During Treatment Gait belt    Activity Tolerance Patient tolerated treatment well;No increased pain    Behavior During Therapy Flat affect;WFL for tasks assessed/performed                        Past Medical History:  Diagnosis Date   AAA (abdominal aortic aneurysm) (HCC)    Alcohol dependence in remission ([place])    Cancer ([place])    skin    Colon polyps    ED (erectile dysfunction)    GERD (gastroesophageal reflux disease)    Hematest positive stools    Melanoma of skin (HCC)    Parkinson's disease ([place])    Renal cyst, right    Restless leg syndrome    Past Surgical History:  Procedure Laterality Date   CHOLECYSTECTOMY     COLON SURGERY     colectomy with anastamosis   COLONOSCOPY WITH PROPOFOL N/A 11/07/2016   Procedure: COLONOSCOPY WITH PROPOFOL;  Surgeon: Wesley Sails, MD;  Location: [facility] ENDOSCOPY;  Service: Endoscopy;  Laterality: N/A;   COLONOSCOPY WITH PROPOFOL N/A 09/06/2018   Procedure: COLONOSCOPY WITH PROPOFOL;  Surgeon: Wesley Sails, MD;  Location: [facility] ENDOSCOPY;  Service: Endoscopy;  Laterality: N/A;   COLONOSCOPY WITH PROPOFOL N/A 05/16/2019   Procedure: COLONOSCOPY WITH PROPOFOL;  Surgeon: Wesley Sails, MD;  Location: [facility] ENDOSCOPY;  Service: Endoscopy;  Laterality: N/A;   CYST EXCISION     vocal cord   DBS placement      deep brain implant     ESOPHAGOGASTRODUODENOSCOPY (EGD) WITH PROPOFOL N/A 09/06/2018   Procedure: ESOPHAGOGASTRODUODENOSCOPY (EGD) WITH PROPOFOL;  Surgeon: Wesley Sails, MD;  Location: [facility] ENDOSCOPY;  Service: Endoscopy;  Laterality: N/A;   MELANOMA EXCISION     TONSILLECTOMY     Patient Active Problem List   Diagnosis Date Noted   Acute gastroenteritis 08/08/2021   Sepsis ([place]) 08/08/2021   Confusion    Anemia 07/09/2021   Weakness 07/09/2021   Fever and chills 07/09/2021   Myalgia 07/09/2021   Influenza A 01/60/1093   Acute metabolic encephalopathy 23/55/7322   AKI (acute kidney injury) ([place]) 07/09/2020   Leukocytosis 07/09/2020   S/P deep brain stimulator placement 07/09/2020   Squamous cell carcinoma, 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 04/24/2020   Heme positive stool 05/27/2016   Alcohol dependence in remission ([place]) 07/31/2013   Cognitive impairment 02/22/2013   History of malignant melanoma of skin 05/15/2012   Parkinson's disease ([place]) 09/28/2011    REFERRING DIAG: Unsteadiness on feet, history of falls  THERAPY DIAG:  Difficulty in walking, not elsewhere classified  Abnormal posture  PERTINENT HISTORY: Wesley Anderson is a 15yoM who is referred to [place] for on-going imbalance in the setting  of Parkinson's Disease. Pt was an active patient here in Nov 2022, but DC due to hospital admission for Flu in November and another due to encephalopathy in December. Pt working with HHPT/OT in the interim. Pt reports no falls in the past several months, had made efforts to be as safe as possible  PRECAUTIONS: Fall risk  SUBJECTIVE:Pt denies pain. Reports ambulating some in household without AD like to the rest room.    PAIN:  Are you having pain? 0/10     TODAY'S TREATMENT:   Neuromuscular re-education 03/28/22   CGA with standing/walking tasks   Nu-Step seat 11 (to increase amplitude of LE movement, arms 11, level 5 for 3.5 minutes. To maintain at least 90+  SPM. Performed to promote alternating reciprocal large movements.   Gait in clinic without AD: 2x100' bouts with min VC's for consistent step lengths and adequate foot clearance to prevent scuffing of feet on floor. Noticed foot scuffing towards end of second bout.   Stepping over obstacles: 2 x  bolsters  shoe box  alternating cone taps (x4 cones), x4 laps  Foot ladder drills for consistent step lengths without AD: x6 laps   Cone weaves: x7 cones. Notable shuffling of gait, decreased speed of gait despite varying VC's to attempt consistent gait cadence   Regressed to figure 8's around 2 cones ~12' apart: x6 laps. Mod VC's for step through gait and foot clearance especially with turns.        Cues for large movements Improved exercise technique, movement at target joints, use of target muscles after mod verbal, visual, tactile cues.     Response to treatment Good tolerance to today's session.     Clinical impression Pt late to session limiting full participation. Performing all balance and gait training without AD. Min to mod VC's for step height and reciprocal step lengths. Initially pt demonstrates excellent carryover but as session progresses, fatigue leads to short steps to shuffling gait. Pt remains having difficult time with turns leading to shuffling gait and some intermittent freezing. Pt will benefit from skilled PT services to progress gait/balance without AD to reduce risk of falls.     PATIENT EDUCATION: Education details: ther-ex, HEP Person educated: Patient Education method: Explanation, Demonstration, Tactile cues, Verbal cues, and Handouts Education comprehension: verbalized understanding and returned demonstration   HOME EXERCISE PROGRAM: Access Code: [email or web address] URL: [email or web address] Date: 12/29/2021 Prepared by: Wesley Anderson  Exercises - Standing Hip Abduction with Counter Support  - 1 x daily - 7 x weekly - 2 sets - 10 reps - 5  seconds hold - Sit to Stand with Counter Support  - 1 x daily - 7 x weekly - 2 sets - 5 reps      PT Short Term Goals - 03/23/22 1506       PT SHORT TERM GOAL #1   Title Patient will demonstrate independence with home exercise program for ability to self treat.    Baseline Has been doing his home exercises somewhat. No questions. (02/16/2022); 03/07/22: Performs every other day; Performing his HEP, no questions, seems to be doing him some good per pt (03/23/2022)    Time 2    Period Weeks    Status Achieved    Target Date 03/21/22      PT SHORT TERM GOAL #2   Title 6MWT c LRAD >820f -> (+) IADL;    Baseline 3036feval; 500 ft with rw, CGA, cues for increasing foot clearance and step length and staying  closer to the rw (02/16/2022): 03/07/2022: 945' with RW    Time 4    Period Weeks    Status Achieved    Target Date 03/07/22      PT SHORT TERM GOAL #3   Title FOTO>10% increase    Baseline Eval: 48; 03/07/22: 46; 50 (03/23/2022)    Time 4    Period Weeks    Status On-going    Target Date 04/04/22      PT SHORT TERM GOAL #4   Title 5xSTS<13sec s UE chair + airex pad.    Baseline Eval 15sec; 11 seconds with cues for increased speed (02/16/2022);    Time 6    Period Weeks    Status Achieved    Target Date 02/07/22              PT Long Term Goals - 03/23/22 1546       PT LONG TERM GOAL #1   Title Patient will have improved function and activity level as evidenced by an increase in FOTO score by 13 points or more.    Baseline Eval 48; 03/07/22: 46; 50 (03/23/2022)    Time 2    Period Weeks    Status On-going    Target Date 05/19/22      PT LONG TERM GOAL #2   Title 6MWT >1266f c LRAD    Baseline Eval: 3082fin 53m46m; 94546ith RW completing full 6 minutes    Time 8    Period Weeks    Status On-going    Target Date 05/19/22      PT LONG TERM GOAL #3   Title Pt will improve his DGI score with rw to > 19 points as a demonstration of decreased fall risk.    Baseline DGI  with rw: 15 (01/03/2022); 03/07/22: 16 relying on RW; 15, did not use RW today (03/23/2022)    Time 8    Period Weeks    Status On-going    Target Date 05/19/22      PT LONG TERM GOAL #4   Title 5xSTS<15sec from chair, s BUE    Baseline eval: requires arms from chair height; 03/07/22: 13.32 sec without UE support    Time 10    Period Weeks    Status Achieved    Target Date 03/07/22              MilSalem Casterairly IV, PT, DPT Physical Therapist- [facility]/14/2023, 3:19 PM

## 2022-03-30 ENCOUNTER — Ambulatory Visit: Payer: Medicare HMO

## 2022-03-30 DIAGNOSIS — R262 Difficulty in walking, not elsewhere classified: Secondary | ICD-10-CM | POA: Diagnosis not present

## 2022-03-30 DIAGNOSIS — R293 Abnormal posture: Secondary | ICD-10-CM

## 2022-03-30 DIAGNOSIS — R42 Dizziness and giddiness: Secondary | ICD-10-CM

## 2022-03-30 NOTE — Therapy (Signed)
OUTPATIENT PHYSICAL THERAPY TREATMENT NOTE   Patient Name: Wesley Anderson. MRN: [id number] DOB:December 01, 1945, 76 y.o., male Today's Date: 03/30/2022  PCP: Emerson Monte, MD REFERRING PROVIDER: Ramonita Lab III, MD   PT End of Session - 03/30/22 1515     Visit Number 16    Number of Visits 40    Date for PT Re-Evaluation 05/19/22    Authorization Type [employer] Medicare    Authorization Time Period 12/27/21-03/21/22    Authorization - Number of Visits 4    Progress Note Due on Visit 10    PT Start Time 1500    PT Stop Time 1545    PT Time Calculation (min) 45 min    Equipment Utilized During Treatment Gait belt    Activity Tolerance Patient tolerated treatment well;No increased pain    Behavior During Therapy Flat affect;WFL for tasks assessed/performed                        Past Medical History:  Diagnosis Date   AAA (abdominal aortic aneurysm) (HCC)    Alcohol dependence in remission ([place])    Cancer ([place])    skin    Colon polyps    ED (erectile dysfunction)    GERD (gastroesophageal reflux disease)    Hematest positive stools    Melanoma of skin (HCC)    Parkinson's disease ([place])    Renal cyst, right    Restless leg syndrome    Past Surgical History:  Procedure Laterality Date   CHOLECYSTECTOMY     COLON SURGERY     colectomy with anastamosis   COLONOSCOPY WITH PROPOFOL N/A 11/07/2016   Procedure: COLONOSCOPY WITH PROPOFOL;  Surgeon: Lollie Sails, MD;  Location: [facility] ENDOSCOPY;  Service: Endoscopy;  Laterality: N/A;   COLONOSCOPY WITH PROPOFOL N/A 09/06/2018   Procedure: COLONOSCOPY WITH PROPOFOL;  Surgeon: Lollie Sails, MD;  Location: [facility] ENDOSCOPY;  Service: Endoscopy;  Laterality: N/A;   COLONOSCOPY WITH PROPOFOL N/A 05/16/2019   Procedure: COLONOSCOPY WITH PROPOFOL;  Surgeon: Lollie Sails, MD;  Location: [facility] ENDOSCOPY;  Service: Endoscopy;  Laterality: N/A;   CYST EXCISION     vocal cord   DBS placement     deep brain implant      ESOPHAGOGASTRODUODENOSCOPY (EGD) WITH PROPOFOL N/A 09/06/2018   Procedure: ESOPHAGOGASTRODUODENOSCOPY (EGD) WITH PROPOFOL;  Surgeon: Lollie Sails, MD;  Location: [facility] ENDOSCOPY;  Service: Endoscopy;  Laterality: N/A;   MELANOMA EXCISION     TONSILLECTOMY     Patient Active Problem List   Diagnosis Date Noted   Acute gastroenteritis 08/08/2021   Sepsis ([place]) 08/08/2021   Confusion    Anemia 07/09/2021   Weakness 07/09/2021   Fever and chills 07/09/2021   Myalgia 07/09/2021   Influenza A 05/31/5101   Acute metabolic encephalopathy 58/52/7782   AKI (acute kidney injury) ([place]) 07/09/2020   Leukocytosis 07/09/2020   S/P deep brain stimulator placement 07/09/2020   Squamous cell carcinoma, 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 04/24/2020   Heme positive stool 05/27/2016   Alcohol dependence in remission ([place]) 07/31/2013   Cognitive impairment 02/22/2013   History of malignant melanoma of skin 05/15/2012   Parkinson's disease ([place]) 09/28/2011    REFERRING DIAG: Unsteadiness on feet, history of falls  THERAPY DIAG:  Difficulty in walking, not elsewhere classified  Abnormal posture  Dizziness and giddiness  PERTINENT HISTORY: Harbor Vanover is a 76yoM who is referred to [facility] for on-going imbalance in the setting of  Parkinson's Disease. Pt was an active patient here in Nov 2022, but DC due to hospital admission for Flu in November and another due to encephalopathy in December. Pt working with HHPT/OT in the interim. Pt reports no falls in the past several months, had made efforts to be as safe as possible  PRECAUTIONS: Fall risk  SUBJECTIVE: Pt denies pain. Reports ambulating some in household without AD like to the rest room.    PAIN:  Are you having pain? 0/10     TODAY'S TREATMENT:   Neuromuscular re-education 03/30/22   CGA with standing/walking tasks   Nu-Step seat 11 (to increase amplitude of LE movement, arms 11, level 3 for 5 minutes. To maintain at  least 90+ SPM. Performed to promote alternating reciprocal large movements.   Gait in clinic without AD: 2x100' bouts with min VC's for consistent step lengths and adequate foot clearance to prevent scuffing of feet on floor. Noticed foot scuffing towards end of second bout. CGA   Gait in clinic without AD: 2x300' bouts with 2# AW's for proprioceptive feedback for improving foot clearance and step lengths and LE strengthening. CGA. Mod VC's for reduction of shuffling gait with slowing gait speed and taking larger steps with good carryover.   Step alternating lunge bringing arms apart to encourage large amplitude motions: 2x6/LE, CGA. Required stepping strategy to correct balance returning stepping LE back to starting position.  Figure 8's around 2 cones ~12' apart: x6 laps. Mod VC's for step through gait and foot clearance especially with turns.  Cone weaves x4 cones: 6 laps needing frequent VC's for step lengths. Notable difficulty with step through gait and foot clearance with addition of cones.     Cues for large movements Improved exercise technique, movement at target joints, use of target muscles after mod verbal, visual, tactile cues.     Response to treatment Good tolerance to today's session.     Clinical impression Continuing POC progressing gait without AD. Pt able to safely perform gait without RW but limited in motor task with step through gait and adequate foot clearance as pt fatigues after ~ 1.5 laps in PT gym. Use of AW's with gait tasks for proprioceptive feedback to attempt to improve step height with fair success. Still demonstrating difficulty with turns due to shuffling gait. Pt will continue to benefit from skilled PT services to progress gait and mobility with LRAD to decrease risk for falls.     PATIENT EDUCATION: Education details: ther-ex, HEP Person educated: Patient Education method: Explanation, Demonstration, Tactile cues, Verbal cues, and  Handouts Education comprehension: verbalized understanding and returned demonstration   HOME EXERCISE PROGRAM: Access Code: [email or web address] URL: [email or web address] Date: 12/29/2021 Prepared by: Joneen Boers  Exercises - Standing Hip Abduction with Counter Support  - 1 x daily - 7 x weekly - 2 sets - 10 reps - 5 seconds hold - Sit to Stand with Counter Support  - 1 x daily - 7 x weekly - 2 sets - 5 reps      PT Short Term Goals - 03/23/22 1506       PT SHORT TERM GOAL #1   Title Patient will demonstrate independence with home exercise program for ability to self treat.    Baseline Has been doing his home exercises somewhat. No questions. (02/16/2022); 03/07/22: Performs every other day; Performing his HEP, no questions, seems to be doing him some good per pt (03/23/2022)    Time 2    Period Weeks  Status Achieved    Target Date 03/21/22      PT SHORT TERM GOAL #2   Title 6MWT c LRAD >889f -> (+) IADL;    Baseline 3052feval; 500 ft with rw, CGA, cues for increasing foot clearance and step length and staying closer to the rw (02/16/2022): 03/07/2022: 945' with RW    Time 4    Period Weeks    Status Achieved    Target Date 03/07/22      PT SHORT TERM GOAL #3   Title FOTO>10% increase    Baseline Eval: 48; 03/07/22: 46; 50 (03/23/2022)    Time 4    Period Weeks    Status On-going    Target Date 04/04/22      PT SHORT TERM GOAL #4   Title 5xSTS<13sec s UE chair + airex pad.    Baseline Eval 15sec; 11 seconds with cues for increased speed (02/16/2022);    Time 6    Period Weeks    Status Achieved    Target Date 02/07/22              PT Long Term Goals - 03/23/22 1546       PT LONG TERM GOAL #1   Title Patient will have improved function and activity level as evidenced by an increase in FOTO score by 13 points or more.    Baseline Eval 48; 03/07/22: 46; 50 (03/23/2022)    Time 2    Period Weeks    Status On-going    Target Date 05/19/22      PT LONG TERM  GOAL #2   Title 6MWT >120055f LRAD    Baseline Eval: 300f80f 2m439m945' 57h RW completing full 6 minutes    Time 8    Period Weeks    Status On-going    Target Date 05/19/22      PT LONG TERM GOAL #3   Title Pt will improve his DGI score with rw to > 19 points as a demonstration of decreased fall risk.    Baseline DGI with rw: 15 (01/03/2022); 03/07/22: 16 relying on RW; 15, did not use RW today (03/23/2022)    Time 8    Period Weeks    Status On-going    Target Date 05/19/22      PT LONG TERM GOAL #4   Title 5xSTS<15sec from chair, s BUE    Baseline eval: requires arms from chair height; 03/07/22: 13.32 sec without UE support    Time 10    Period Weeks    Status Achieved    Target Date 03/07/22              MiltoSalem Casterrly IV, PT, DPT Physical Therapist- [facility]6/2023, 4:17 PM

## 2022-04-04 ENCOUNTER — Ambulatory Visit: Payer: Medicare HMO

## 2022-04-06 ENCOUNTER — Ambulatory Visit: Payer: Medicare HMO

## 2022-04-11 ENCOUNTER — Ambulatory Visit: Payer: Medicare HMO

## 2022-04-11 DIAGNOSIS — R262 Difficulty in walking, not elsewhere classified: Secondary | ICD-10-CM

## 2022-04-11 DIAGNOSIS — R293 Abnormal posture: Secondary | ICD-10-CM

## 2022-04-11 NOTE — Therapy (Signed)
OUTPATIENT PHYSICAL THERAPY TREATMENT NOTE   Patient Name: Wesley Anderson. MRN: [id number] DOB:1945/10/05, 76 y.o., male Today's Date: 04/11/2022  PCP: Emerson Monte, MD REFERRING PROVIDER: Ramonita Lab III, MD   PT End of Session - 04/11/22 1508     Visit Number 17    Number of Visits 40    Date for PT Re-Evaluation 05/19/22    Authorization Type [employer]    Authorization Time Period 12/27/21-03/21/22    Authorization - Number of Visits 7    Progress Note Due on Visit 10    PT Start Time 1508    PT Stop Time 1539    PT Time Calculation (min) 31 min    Equipment Utilized During Treatment Gait belt    Activity Tolerance Patient tolerated treatment well;No increased pain    Behavior During Therapy Flat affect;WFL for tasks assessed/performed                         Past Medical History:  Diagnosis Date   AAA (abdominal aortic aneurysm) (HCC)    Alcohol dependence in remission ([place])    Cancer ([place])    skin    Colon polyps    ED (erectile dysfunction)    GERD (gastroesophageal reflux disease)    Hematest positive stools    Melanoma of skin (HCC)    Parkinson's disease ([place])    Renal cyst, right    Restless leg syndrome    Past Surgical History:  Procedure Laterality Date   CHOLECYSTECTOMY     COLON SURGERY     colectomy with anastamosis   COLONOSCOPY WITH PROPOFOL N/A 11/07/2016   Procedure: COLONOSCOPY WITH PROPOFOL;  Surgeon: Lollie Sails, MD;  Location: [facility] ENDOSCOPY;  Service: Endoscopy;  Laterality: N/A;   COLONOSCOPY WITH PROPOFOL N/A 09/06/2018   Procedure: COLONOSCOPY WITH PROPOFOL;  Surgeon: Lollie Sails, MD;  Location: [facility] ENDOSCOPY;  Service: Endoscopy;  Laterality: N/A;   COLONOSCOPY WITH PROPOFOL N/A 05/16/2019   Procedure: COLONOSCOPY WITH PROPOFOL;  Surgeon: Lollie Sails, MD;  Location: [facility] ENDOSCOPY;  Service: Endoscopy;  Laterality: N/A;   CYST EXCISION     vocal cord   DBS placement     deep brain implant      ESOPHAGOGASTRODUODENOSCOPY (EGD) WITH PROPOFOL N/A 09/06/2018   Procedure: ESOPHAGOGASTRODUODENOSCOPY (EGD) WITH PROPOFOL;  Surgeon: Lollie Sails, MD;  Location: [facility] ENDOSCOPY;  Service: Endoscopy;  Laterality: N/A;   MELANOMA EXCISION     TONSILLECTOMY     Patient Active Problem List   Diagnosis Date Noted   Acute gastroenteritis 08/08/2021   Sepsis ([place]) 08/08/2021   Confusion    Anemia 07/09/2021   Weakness 07/09/2021   Fever and chills 07/09/2021   Myalgia 07/09/2021   Influenza A 74/25/9563   Acute metabolic encephalopathy 87/56/4332   AKI (acute kidney injury) ([place]) 07/09/2020   Leukocytosis 07/09/2020   S/P deep brain stimulator placement 07/09/2020   Squamous cell carcinoma, 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 04/24/2020   Heme positive stool 05/27/2016   Alcohol dependence in remission ([place]) 07/31/2013   Cognitive impairment 02/22/2013   History of malignant melanoma of skin 05/15/2012   Parkinson's disease ([place]) 09/28/2011    REFERRING DIAG: Unsteadiness on feet, history of falls  THERAPY DIAG:  Difficulty in walking, not elsewhere classified  Abnormal posture  PERTINENT HISTORY: Brenan Modesto is a 25yoM who is referred to [facility]PP[facility] for on-going imbalance in the setting of Parkinson's Disease. Pt  was an active patient here in Nov 2022, but DC due to hospital admission for Flu in November and another due to encephalopathy in December. Pt working with HHPT/OT in the interim. Pt reports no falls in the past several months, had made efforts to be as safe as possible  PRECAUTIONS: Fall risk  SUBJECTIVE: Legs have been swollen. Better now. Having an echocardiogram this Wednesday. Having a physical sometime this week. Doctor said that it is ok to continue PT. Has been keeping his legs elevated which has helped a lot.    PAIN:  Are you having pain? 0/10     TODAY'S TREATMENT:  Neuromuscular re-education  CGA with standing tasks  (-) B calf squeeze test.  No abnormal redness or warmth seen or felt both legs.   Gait with rw 200 ft, emphasis in increasing step length  Stepping over shoe box with contralateral UE assist   R 6x  L 6 x   Then without UE assist   L 2x  R 2x.LOB 1x secondary to R LE stance weakness during second repetition.   Seated hamstring stretch with feet on stool 2 minutes to promote knee extension   Standing with B feet spread apart                  B shoulder horizontal abduction with B scapular retraction with R and L trunk rotation with arms in abduction 10x3                  To promote large movements.    Four square step exercise 4x each direction, no UE assist, but CGA. Able to perform, no LOB.        Cues for large movements Improved exercise technique, movement at target joints, use of target muscles after mod verbal, visual, tactile cues.     Response to treatment Pt tolerated session well without aggravation of symptoms.     Clinical impression Pt overall making progress with increasing step length with gait. Able to clear a shoe box without UE assist once for each feet but LOB when stepping over with R LE the second time in which R glute med weakness may play a factor. Able to perform steps forward, back, and side to side both directions without UE assist and no LOB when performing the 4 square step exercise. Pt tolerated session well without aggravation of symptoms. Pt will benefit from continued skilled physical therapy services to improve the aforementioned deficits.        PATIENT EDUCATION: Education details: ther-ex, HEP Person educated: Patient Education method: Explanation, Demonstration, Tactile cues, Verbal cues, and Handouts Education comprehension: verbalized understanding and returned demonstration   HOME EXERCISE PROGRAM: Access Code: [email or web address] URL: [email or web address] Date: 12/29/2021 Prepared by: Joneen Boers  Exercises - Standing Hip Abduction with  Counter Support  - 1 x daily - 7 x weekly - 2 sets - 10 reps - 5 seconds hold - Sit to Stand with Counter Support  - 1 x daily - 7 x weekly - 2 sets - 5 reps      PT Short Term Goals - 03/23/22 1506       PT SHORT TERM GOAL #1   Title Patient will demonstrate independence with home exercise program for ability to self treat.    Baseline Has been doing his home exercises somewhat. No questions. (02/16/2022); 03/07/22: Performs every other day; Performing his HEP, no questions, seems to be doing him some good per pt (03/23/2022)  Time 2    Period Weeks    Status Achieved    Target Date 03/21/22      PT SHORT TERM GOAL #2   Title 6MWT c LRAD >871f -> (+) IADL;    Baseline 3072feval; 500 ft with rw, CGA, cues for increasing foot clearance and step length and staying closer to the rw (02/16/2022): 03/07/2022: 945' with RW    Time 4    Period Weeks    Status Achieved    Target Date 03/07/22      PT SHORT TERM GOAL #3   Title FOTO>10% increase    Baseline Eval: 48; 03/07/22: 46; 50 (03/23/2022)    Time 4    Period Weeks    Status On-going    Target Date 04/04/22      PT SHORT TERM GOAL #4   Title 5xSTS<13sec s UE chair + airex pad.    Baseline Eval 15sec; 11 seconds with cues for increased speed (02/16/2022);    Time 6    Period Weeks    Status Achieved    Target Date 02/07/22              PT Long Term Goals - 03/23/22 1546       PT LONG TERM GOAL #1   Title Patient will have improved function and activity level as evidenced by an increase in FOTO score by 13 points or more.    Baseline Eval 48; 03/07/22: 46; 50 (03/23/2022)    Time 2    Period Weeks    Status On-going    Target Date 05/19/22      PT LONG TERM GOAL #2   Title 6MWT >120045f LRAD    Baseline Eval: 300f28f [id number]' 67h RW completing full 6 minutes    Time 8    Period Weeks    Status On-going    Target Date 05/19/22      PT LONG TERM GOAL #3   Title Pt will improve his DGI score with rw to > 19  points as a demonstration of decreased fall risk.    Baseline DGI with rw: 15 (01/03/2022); 03/07/22: 16 relying on RW; 15, did not use RW today (03/23/2022)    Time 8    Period Weeks    Status On-going    Target Date 05/19/22      PT LONG TERM GOAL #4   Title 5xSTS<15sec from chair, s BUE    Baseline eval: requires arms from chair height; 03/07/22: 13.32 sec without UE support    Time 10    Period Weeks    Status Achieved    Target Date 03/07/22              Plan - 04/11/22 1507     Clinical Impression Statement Pt overall making progress with increasing step length with gait. Able to clear a shoe box without UE assist once for each feet but LOB when stepping over with R LE the second time in which R glute med weakness may play a factor. Able to perform steps forward, back, and side to side both directions without UE assist and no LOB when performing the 4 square step exercise. Pt tolerated session well without aggravation of symptoms. Pt will benefit from continued skilled physical therapy services to improve the aforementioned deficits.    Personal Factors and Comorbidities Age;Past/Current Experience    Comorbidities PD, H/o cancer,    Examination-Activity Limitations Squat;Stairs;Stand;LocomEngineer, manufacturing  Examination-Participation Restrictions Cleaning;Medication Management;Interpersonal Relationship;Community Activity    Stability/Clinical Decision Making Stable/Uncomplicated    Rehab Potential Fair    PT Frequency 2x / week    PT Duration 8 weeks    PT Treatment/Interventions ADLs/Self Care Home Management;Neuromuscular re-education;Balance training;Therapeutic exercise;Orthotic Fit/Training;Prosthetic Training;Patient/family education;Wheelchair mobility training;Manual techniques;Passive range of motion;Energy conservation;Vestibular;Joint Manipulations;Gait training;Therapeutic activities;Functional mobility training;Stair training    Consulted and Agree with Plan  of Care Patient                        Joneen Boers PT, DPT  04/11/2022, 3:48 PM

## 2022-04-13 ENCOUNTER — Telehealth: Payer: Self-pay

## 2022-04-13 ENCOUNTER — Ambulatory Visit: Payer: Medicare HMO

## 2022-04-13 DIAGNOSIS — R262 Difficulty in walking, not elsewhere classified: Secondary | ICD-10-CM

## 2022-04-13 DIAGNOSIS — R42 Dizziness and giddiness: Secondary | ICD-10-CM

## 2022-04-13 DIAGNOSIS — R293 Abnormal posture: Secondary | ICD-10-CM

## 2022-04-13 NOTE — Telephone Encounter (Signed)
Called Dr Olin Pia office. Nurse Philomena Course) stated that pt has not yet had his ultrasound. Per Dr Caryl Comes, pt can do upper body exercises for now, no rigorous lower body exercises for PT. Trying to get pt to do an ultrasound within the next 24 hours.

## 2022-04-13 NOTE — Therapy (Signed)
OUTPATIENT PHYSICAL THERAPY TREATMENT NOTE   Patient Name: Wesley Anderson. MRN: [id number] DOB:08-24-1945, 76 y.o., male Today's Date: 04/13/2022  PCP: Wesley Monte, MD REFERRING PROVIDER: Ramonita Lab III, MD   PT End of Session - 04/13/22 1505     Visit Number 18    Number of Visits 40    Date for PT Re-Evaluation 05/19/22    Authorization Type [employer] Medicare    Authorization Time Period 12/27/21-03/21/22    Authorization - Number of Visits 8    Progress Note Due on Visit 10    PT Start Time 1505    PT Stop Time 1532    PT Time Calculation (min) 27 min    Equipment Utilized During Treatment Gait belt    Activity Tolerance Patient tolerated treatment well;No increased pain    Behavior During Therapy Flat affect;WFL for tasks assessed/performed                          Past Medical History:  Diagnosis Date   AAA (abdominal aortic aneurysm) (HCC)    Alcohol dependence in remission ([place])    Cancer ([place])    skin    Colon polyps    ED (erectile dysfunction)    GERD (gastroesophageal reflux disease)    Hematest positive stools    Melanoma of skin (HCC)    Parkinson's disease ([place])    Renal cyst, right    Restless leg syndrome    Past Surgical History:  Procedure Laterality Date   CHOLECYSTECTOMY     COLON SURGERY     colectomy with anastamosis   COLONOSCOPY WITH PROPOFOL N/A 11/07/2016   Procedure: COLONOSCOPY WITH PROPOFOL;  Surgeon: Wesley Sails, MD;  Location: [facility] ENDOSCOPY;  Service: Endoscopy;  Laterality: N/A;   COLONOSCOPY WITH PROPOFOL N/A 09/06/2018   Procedure: COLONOSCOPY WITH PROPOFOL;  Surgeon: Wesley Sails, MD;  Location: [facility] ENDOSCOPY;  Service: Endoscopy;  Laterality: N/A;   COLONOSCOPY WITH PROPOFOL N/A 05/16/2019   Procedure: COLONOSCOPY WITH PROPOFOL;  Surgeon: Wesley Sails, MD;  Location: [facility] ENDOSCOPY;  Service: Endoscopy;  Laterality: N/A;   CYST EXCISION     vocal cord   DBS placement     deep brain implant      ESOPHAGOGASTRODUODENOSCOPY (EGD) WITH PROPOFOL N/A 09/06/2018   Procedure: ESOPHAGOGASTRODUODENOSCOPY (EGD) WITH PROPOFOL;  Surgeon: Wesley Sails, MD;  Location: [facility] ENDOSCOPY;  Service: Endoscopy;  Laterality: N/A;   MELANOMA EXCISION     TONSILLECTOMY     Patient Active Problem List   Diagnosis Date Noted   Acute gastroenteritis 08/08/2021   Sepsis ([place]) 08/08/2021   Confusion    Anemia 07/09/2021   Weakness 07/09/2021   Fever and chills 07/09/2021   Myalgia 07/09/2021   Influenza A 40/98/1191   Acute metabolic encephalopathy 47/82/9562   AKI (acute kidney injury) ([place]) 07/09/2020   Leukocytosis 07/09/2020   S/P deep brain stimulator placement 07/09/2020   Squamous cell carcinoma, 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 04/24/2020   Heme positive stool 05/27/2016   Alcohol dependence in remission ([place]) 07/31/2013   Cognitive impairment 02/22/2013   History of malignant melanoma of skin 05/15/2012   Parkinson's disease ([place]) 09/28/2011    REFERRING DIAG: Unsteadiness on feet, history of falls  THERAPY DIAG:  Difficulty in walking, not elsewhere classified  Abnormal posture  Dizziness and giddiness  PERTINENT HISTORY: Wesley Anderson is a 89yoM who is referred to [facility] for on-going imbalance in the  setting of Parkinson's Disease. Pt was an active patient here in Nov 2022, but DC due to hospital admission for Flu in November and another due to encephalopathy in December. Pt working with HHPT/OT in the interim. Pt reports no falls in the past several months, had made efforts to be as safe as possible  PRECAUTIONS: Fall risk  SUBJECTIVE: Just getting old. Had an echocardiogram earlier.    PAIN:  Are you having pain? 0/10     TODAY'S TREATMENT:  Neuromuscular re-education  CGA with standing tasks  Standing with B feet spread apart     B shoulder horizontal abduction with B scapular retraction 10x2 with 5 second holds                  B shoulder  horizontal abduction with B scapular retraction with R and L trunk rotation with arms in abduction 10x                  To promote large movements.    Seated thoracic extension at chair 10x5 seconds for 3 sets to promote upright posture  Seated trunk rotation with B shoulder adduction position to promote large movements 10x2  Seated manually resisted scapular retraction targeting lower trap muscles to promote upright posture   R 10x5 seconds. Demonstrates difficulty with mechanics   L 10x5 seconds. Demonstrates difficulty with mechanics      Cues for large movements Improved exercise technique, movement at target joints, use of target muscles after mod verbal, visual, tactile cues.     Response to treatment Pt tolerated session well without aggravation of symptoms.     Clinical impression  Worked on upper extremities today to promote thoracic extension (to decrease flexed posture) and large movements. Recommended pt to call his PCP to schedule his ultrasound for his LE. Pt verbalized understanding. Pt tolerated session well without aggravation of symptoms. Pt will benefit from continued skilled physical therapy services to improve the aforementioned deficits.        PATIENT EDUCATION: Education details: ther-ex, HEP Person educated: Patient Education method: Explanation, Demonstration, Tactile cues, Verbal cues, and Handouts Education comprehension: verbalized understanding and returned demonstration   HOME EXERCISE PROGRAM: Access Code: [email or web address] URL: [email or web address] Date: 12/29/2021 Prepared by: Wesley Anderson  Exercises - Standing Hip Abduction with Counter Support  - 1 x daily - 7 x weekly - 2 sets - 10 reps - 5 seconds hold - Sit to Stand with Counter Support  - 1 x daily - 7 x weekly - 2 sets - 5 reps      PT Short Term Goals - 03/23/22 1506       PT SHORT TERM GOAL #1   Title Patient will demonstrate independence with home exercise  program for ability to self treat.    Baseline Has been doing his home exercises somewhat. No questions. (02/16/2022); 03/07/22: Performs every other day; Performing his HEP, no questions, seems to be doing him some good per pt (03/23/2022)    Time 2    Period Weeks    Status Achieved    Target Date 03/21/22      PT SHORT TERM GOAL #2   Title 6MWT c LRAD >842f -> (+) IADL;    Baseline 3077feval; 500 ft with rw, CGA, cues for increasing foot clearance and step length and staying closer to the rw (02/16/2022): 03/07/2022: 945' with RW    Time 4    Period Weeks    Status Achieved  Target Date 03/07/22      PT SHORT TERM GOAL #3   Title FOTO>10% increase    Baseline Eval: 48; 03/07/22: 46; 50 (03/23/2022)    Time 4    Period Weeks    Status On-going    Target Date 04/04/22      PT SHORT TERM GOAL #4   Title 5xSTS<13sec s UE chair + airex pad.    Baseline Eval 15sec; 11 seconds with cues for increased speed (02/16/2022);    Time 6    Period Weeks    Status Achieved    Target Date 02/07/22              PT Long Term Goals - 03/23/22 1546       PT LONG TERM GOAL #1   Title Patient will have improved function and activity level as evidenced by an increase in FOTO score by 13 points or more.    Baseline Eval 48; 03/07/22: 46; 50 (03/23/2022)    Time 2    Period Weeks    Status On-going    Target Date 05/19/22      PT LONG TERM GOAL #2   Title 6MWT >1246f c LRAD    Baseline Eval: 3028fin 92m57m; 94532ith RW completing full 6 minutes    Time 8    Period Weeks    Status On-going    Target Date 05/19/22      PT LONG TERM GOAL #3   Title Pt will improve his DGI score with rw to > 19 points as a demonstration of decreased fall risk.    Baseline DGI with rw: 15 (01/03/2022); 03/07/22: 16 relying on RW; 15, did not use RW today (03/23/2022)    Time 8    Period Weeks    Status On-going    Target Date 05/19/22      PT LONG TERM GOAL #4   Title 5xSTS<15sec from chair, s BUE    Baseline  eval: requires arms from chair height; 03/07/22: 13.32 sec without UE support    Time 10    Period Weeks    Status Achieved    Target Date 03/07/22              Plan - 04/13/22 1504     Clinical Impression Statement Worked on upper extremities today to promote thoracic extension (to decrease flexed posture) and large movements. Recommended pt to call his PCP to schedule his ultrasound for his LE. Pt verbalized understanding. Pt tolerated session well without aggravation of symptoms. Pt will benefit from continued skilled physical therapy services to improve the aforementioned deficits.    Personal Factors and Comorbidities Age;Past/Current Experience    Comorbidities PD, H/o cancer,    Examination-Activity Limitations Squat;Stairs;Stand;Locomotion Level;Reach Overhead    Examination-Participation Restrictions Cleaning;Medication Management;Interpersonal Relationship;Community Activity    Stability/Clinical Decision Making Stable/Uncomplicated    Clinical Decision Making Low    Rehab Potential Fair    PT Frequency 2x / week    PT Duration 8 weeks    PT Treatment/Interventions ADLs/Self Care Home Management;Neuromuscular re-education;Balance training;Therapeutic exercise;Orthotic Fit/Training;Prosthetic Training;Patient/family education;Wheelchair mobility training;Manual techniques;Passive range of motion;Energy conservation;Vestibular;Joint Manipulations;Gait training;Therapeutic activities;Functional mobility training;Stair training    Consulted and Agree with Plan of Care Patient                         MigJoneen Anderson, DPT  04/13/2022, 3:41 PM

## 2022-04-19 ENCOUNTER — Ambulatory Visit: Payer: Medicare HMO | Attending: Internal Medicine

## 2022-04-19 DIAGNOSIS — R42 Dizziness and giddiness: Secondary | ICD-10-CM | POA: Diagnosis present

## 2022-04-19 DIAGNOSIS — R293 Abnormal posture: Secondary | ICD-10-CM | POA: Diagnosis present

## 2022-04-19 DIAGNOSIS — R262 Difficulty in walking, not elsewhere classified: Secondary | ICD-10-CM | POA: Insufficient documentation

## 2022-04-19 NOTE — Therapy (Signed)
OUTPATIENT PHYSICAL THERAPY TREATMENT NOTE   Patient Name: Wesley Anderson. MRN: [id number] DOB:Mar 09, 1946, 76 y.o., male Today's Date: 04/19/2022  PCP: Wesley Monte, MD REFERRING PROVIDER: Ramonita Lab III, MD   PT End of Session - 04/19/22 1417     Visit Number 19    Number of Visits 40    Date for PT Re-Evaluation 05/19/22    Authorization Type [employer]    Authorization Time Period 12/27/21-03/21/22    Authorization - Number of Visits 9    Progress Note Due on Visit 10    PT Start Time 5631    PT Stop Time 1457    PT Time Calculation (min) 40 min    Equipment Utilized During Treatment Gait belt    Activity Tolerance Patient tolerated treatment well;No increased pain    Behavior During Therapy Flat affect;WFL for tasks assessed/performed                           Past Medical History:  Diagnosis Date   AAA (abdominal aortic aneurysm) (HCC)    Alcohol dependence in remission ([place])    Cancer ([place])    skin    Colon polyps    ED (erectile dysfunction)    GERD (gastroesophageal reflux disease)    Hematest positive stools    Melanoma of skin (HCC)    Parkinson's disease ([place])    Renal cyst, right    Restless leg syndrome    Past Surgical History:  Procedure Laterality Date   CHOLECYSTECTOMY     COLON SURGERY     colectomy with anastamosis   COLONOSCOPY WITH PROPOFOL N/A 11/07/2016   Procedure: COLONOSCOPY WITH PROPOFOL;  Surgeon: Wesley Sails, MD;  Location: [facility] ENDOSCOPY;  Service: Endoscopy;  Laterality: N/A;   COLONOSCOPY WITH PROPOFOL N/A 09/06/2018   Procedure: COLONOSCOPY WITH PROPOFOL;  Surgeon: Wesley Sails, MD;  Location: [facility] ENDOSCOPY;  Service: Endoscopy;  Laterality: N/A;   COLONOSCOPY WITH PROPOFOL N/A 05/16/2019   Procedure: COLONOSCOPY WITH PROPOFOL;  Surgeon: Wesley Sails, MD;  Location: [facility] ENDOSCOPY;  Service: Endoscopy;  Laterality: N/A;   CYST EXCISION     vocal cord   DBS placement     deep brain  implant     ESOPHAGOGASTRODUODENOSCOPY (EGD) WITH PROPOFOL N/A 09/06/2018   Procedure: ESOPHAGOGASTRODUODENOSCOPY (EGD) WITH PROPOFOL;  Surgeon: Wesley Sails, MD;  Location: [facility] ENDOSCOPY;  Service: Endoscopy;  Laterality: N/A;   MELANOMA EXCISION     TONSILLECTOMY     Patient Active Problem List   Diagnosis Date Noted   Acute gastroenteritis 08/08/2021   Sepsis ([place]) 08/08/2021   Confusion    Anemia 07/09/2021   Weakness 07/09/2021   Fever and chills 07/09/2021   Myalgia 07/09/2021   Influenza A 49/70/2637   Acute metabolic encephalopathy 85/88/5027   AKI (acute kidney injury) ([place]) 07/09/2020   Leukocytosis 07/09/2020   S/P deep brain stimulator placement 07/09/2020   Squamous cell carcinoma, 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 04/24/2020   Heme positive stool 05/27/2016   Alcohol dependence in remission ([place]) 07/31/2013   Cognitive impairment 02/22/2013   History of malignant melanoma of skin 05/15/2012   Parkinson's disease ([place]) 09/28/2011    REFERRING DIAG: Unsteadiness on feet, history of falls  THERAPY DIAG:  Difficulty in walking, not elsewhere classified  Abnormal posture  PERTINENT HISTORY: [name] is a 72yoM who is referred to [facility] OPPT for on-going imbalance in the setting of Parkinson's  Disease. Pt was an active patient here in Nov 2022, but DC due to hospital admission for Flu in November and another due to encephalopathy in December. Pt working with HHPT/OT in the interim. Pt reports no falls in the past several months, had made efforts to be as safe as possible  PRECAUTIONS: Fall risk  SUBJECTIVE: Ankles are still swollen. Was told that his heart is not pumping as hard as it should. Was recommended to see a cardiologist also. Also has a physical coming up. Has not had an ultrasound for his legs yet. Back pain is a lot better. Still lets him know it is there occasionally.    PAIN:  Are you having pain? 0/10     TODAY'S TREATMENT:   Neuromuscular re-education  CGA with standing tasks  Seated thoracic extension at chair 10x5 seconds for 3 sets to promote upright posture  Standing with B feet spread apart     B shoulder horizontal abduction with B scapular retraction 10x3 with 5 second holds                  B shoulder horizontal abduction with B scapular retraction with R and L trunk rotation with arms in abduction 10x3    B shoulder flexion/extension alternating 10x3                 To promote large movements.   Seated manually resisted scapular retraction targeting lower trap muscles to promote upright posture   R 10x5 seconds.   L 10x5 seconds.        Cues for large movements Improved exercise technique, movement at target joints, use of target muscles after mod verbal, visual, tactile cues.     Response to treatment Pt tolerated session well without aggravation of symptoms.     Clinical impression  Worked on upper extremities today to promote thoracic extension (to decrease flexed posture) and large movements secondary to pt not yet having ultrasound for his legs yet. Pt was recommended to call his PCP to schedule his ultrasound for his LE. Pt verbalized understanding. Pt tolerated session well without aggravation of symptoms. Pt will benefit from continued skilled physical therapy services to improve the aforementioned deficits.        PATIENT EDUCATION: Education details: ther-ex, HEP Person educated: Patient Education method: Explanation, Demonstration, Tactile cues, Verbal cues, and Handouts Education comprehension: verbalized understanding and returned demonstration   HOME EXERCISE PROGRAM: Access Code: [email or web address] URL: [email or web address] Date: 12/29/2021 Prepared by: Wesley Anderson  Exercises - Standing Hip Abduction with Counter Support  - 1 x daily - 7 x weekly - 2 sets - 10 reps - 5 seconds hold - Sit to Stand with Counter Support  - 1 x daily - 7 x weekly - 2  sets - 5 reps      PT Short Term Goals - 03/23/22 1506       PT SHORT TERM GOAL #1   Title Patient will demonstrate independence with home exercise program for ability to self treat.    Baseline Has been doing his home exercises somewhat. No questions. (02/16/2022); 03/07/22: Performs every other day; Performing his HEP, no questions, seems to be doing him some good per pt (03/23/2022)    Time 2    Period Weeks    Status Achieved    Target Date 03/21/22      PT SHORT TERM GOAL #2   Title 6MWT c LRAD >842f -> (+) IADL;    Baseline 3038feval;  500 ft with rw, CGA, cues for increasing foot clearance and step length and staying closer to the rw (02/16/2022): 03/07/2022: 945' with RW    Time 4    Period Weeks    Status Achieved    Target Date 03/07/22      PT SHORT TERM GOAL #3   Title FOTO>10% increase    Baseline Eval: 48; 03/07/22: 46; 50 (03/23/2022)    Time 4    Period Weeks    Status On-going    Target Date 04/04/22      PT SHORT TERM GOAL #4   Title 5xSTS<13sec s UE chair + airex pad.    Baseline Eval 15sec; 11 seconds with cues for increased speed (02/16/2022);    Time 6    Period Weeks    Status Achieved    Target Date 02/07/22              PT Long Term Goals - 03/23/22 1546       PT LONG TERM GOAL #1   Title Patient will have improved function and activity level as evidenced by an increase in FOTO score by 13 points or more.    Baseline Eval 48; 03/07/22: 46; 50 (03/23/2022)    Time 2    Period Weeks    Status On-going    Target Date 05/19/22      PT LONG TERM GOAL #2   Title 6MWT >1254f c LRAD    Baseline Eval: 3032fin 16m74m; 94563ith RW completing full 6 minutes    Time 8    Period Weeks    Status On-going    Target Date 05/19/22      PT LONG TERM GOAL #3   Title Pt will improve his DGI score with rw to > 19 points as a demonstration of decreased fall risk.    Baseline DGI with rw: 15 (01/03/2022); 03/07/22: 16 relying on RW; 15, did not use RW today  (03/23/2022)    Time 8    Period Weeks    Status On-going    Target Date 05/19/22      PT LONG TERM GOAL #4   Title 5xSTS<15sec from chair, s BUE    Baseline eval: requires arms from chair height; 03/07/22: 13.32 sec without UE support    Time 10    Period Weeks    Status Achieved    Target Date 03/07/22              Plan - 04/19/22 1417     Clinical Impression Statement Worked on upper extremities today to promote thoracic extension (to decrease flexed posture) and large movements secondary to pt not yet having ultrasound for his legs yet. Pt was recommended to call his PCP to schedule his ultrasound for his LE. Pt verbalized understanding. Pt tolerated session well without aggravation of symptoms. Pt will benefit from continued skilled physical therapy services to improve the aforementioned deficits.    Personal Factors and Comorbidities Age;Past/Current Experience    Comorbidities PD, H/o cancer,    Examination-Activity Limitations Squat;Stairs;Stand;Locomotion Level;Reach Overhead    Examination-Participation Restrictions Cleaning;Medication Management;Interpersonal Relationship;Community Activity    Stability/Clinical Decision Making Stable/Uncomplicated    Rehab Potential Fair    PT Frequency 2x / week    PT Duration 8 weeks    PT Treatment/Interventions ADLs/Self Care Home Management;Neuromuscular re-education;Balance training;Therapeutic exercise;Orthotic Fit/Training;Prosthetic Training;Patient/family education;Wheelchair mobility training;Manual techniques;Passive range of motion;Energy conservation;Vestibular;Joint Manipulations;Gait training;Therapeutic activities;Functional mobility training;Stair training    Consulted and Agree  with Plan of Care Patient                          Wesley Anderson PT, DPT  04/19/2022, 3:00 PM

## 2022-04-21 ENCOUNTER — Telehealth: Payer: Self-pay

## 2022-04-21 ENCOUNTER — Ambulatory Visit: Payer: Medicare HMO

## 2022-04-21 DIAGNOSIS — R262 Difficulty in walking, not elsewhere classified: Secondary | ICD-10-CM | POA: Diagnosis not present

## 2022-04-21 DIAGNOSIS — R293 Abnormal posture: Secondary | ICD-10-CM

## 2022-04-21 NOTE — Telephone Encounter (Signed)
Called Dr Olin Pia office for an update on the LE [place] for LE exercises for PT. Talked to his receptionist.  Return phone call requested. Phone number [phone]) provided.

## 2022-04-21 NOTE — Therapy (Signed)
OUTPATIENT PHYSICAL THERAPY TREATMENT NOTE And Progress Report (03/07/2022 - 04/21/2022)   Patient Name: Wesley Anderson. MRN: [id number] DOB:1946/05/30, 76 y.o., male Today's Date: 04/21/2022  PCP: Emerson Monte, MD REFERRING PROVIDER: Ramonita Lab III, MD   PT End of Session - 04/21/22 1423     Visit Number 20    Number of Visits 40    Date for PT Re-Evaluation 05/19/22    Authorization Type [employer]na Medicare    Authorization Time Period 12/27/21-03/21/22    Authorization - Number of Visits 10    Progress Note Due on Visit 10    PT Start Time 1423    PT Stop Time 1456    PT Time Calculation (min) 33 min    Equipment Utilized During Treatment Gait belt    Activity Tolerance Patient tolerated treatment well;No increased pain    Behavior During Therapy Flat affect;WFL for tasks assessed/performed                            Past Medical History:  Diagnosis Date   AAA (abdominal aortic aneurysm) (HCC)    Alcohol dependence in remission ([place])    Cancer ([place])    skin    Colon polyps    ED (erectile dysfunction)    GERD (gastroesophageal reflux disease)    Hematest positive stools    Melanoma of skin (HCC)    Parkinson's disease ([place])    Renal cyst, right    Restless leg syndrome    Past Surgical History:  Procedure Laterality Date   CHOLECYSTECTOMY     COLON SURGERY     colectomy with anastamosis   COLONOSCOPY WITH PROPOFOL N/A 11/07/2016   Procedure: COLONOSCOPY WITH PROPOFOL;  Surgeon: Lollie Sails, MD;  Location: [facility] ENDOSCOPY;  Service: Endoscopy;  Laterality: N/A;   COLONOSCOPY WITH PROPOFOL N/A 09/06/2018   Procedure: COLONOSCOPY WITH PROPOFOL;  Surgeon: Lollie Sails, MD;  Location: [facility] ENDOSCOPY;  Service: Endoscopy;  Laterality: N/A;   COLONOSCOPY WITH PROPOFOL N/A 05/16/2019   Procedure: COLONOSCOPY WITH PROPOFOL;  Surgeon: Lollie Sails, MD;  Location: [facility] ENDOSCOPY;  Service: Endoscopy;  Laterality: N/A;   CYST EXCISION      vocal cord   DBS placement     deep brain implant     ESOPHAGOGASTRODUODENOSCOPY (EGD) WITH PROPOFOL N/A 09/06/2018   Procedure: ESOPHAGOGASTRODUODENOSCOPY (EGD) WITH PROPOFOL;  Surgeon: Lollie Sails, MD;  Location: [facility] ENDOSCOPY;  Service: Endoscopy;  Laterality: N/A;   MELANOMA EXCISION     TONSILLECTOMY     Patient Active Problem List   Diagnosis Date Noted   Acute gastroenteritis 08/08/2021   Sepsis ([place]) 08/08/2021   Confusion    Anemia 07/09/2021   Weakness 07/09/2021   Fever and chills 07/09/2021   Myalgia 07/09/2021   Influenza A 43/32/9518   Acute metabolic encephalopathy 84/16/6063   AKI (acute kidney injury) ([place]) 07/09/2020   Leukocytosis 07/09/2020   S/P deep brain stimulator placement 07/09/2020   Squamous cell carcinoma, 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 04/24/2020   Heme positive stool 05/27/2016   Alcohol dependence in remission ([place]) 07/31/2013   Cognitive impairment 02/22/2013   History of malignant melanoma of skin 05/15/2012   Parkinson's disease ([place]) 09/28/2011    REFERRING DIAG: Unsteadiness on feet, history of falls  THERAPY DIAG:  Difficulty in walking, not elsewhere classified  Abnormal posture  PERTINENT HISTORY: Wesley Anderson is a 40yoM who is referred to [facility] for  on-going imbalance in the setting of Parkinson's Disease. Pt was an active patient here in Nov 2022, but DC due to hospital admission for Flu in November and another due to encephalopathy in December. Pt working with HHPT/OT in the interim. Pt reports no falls in the past several months, had made efforts to be as safe as possible  PRECAUTIONS: Fall risk  SUBJECTIVE: Got an EKG this morning which was fine. Was not able to get in touch with Dr. Caryl Comes about the LE [place].    PAIN:  Are you having pain? 0/10     TODAY'S TREATMENT:  Neuromuscular re-education  CGA with standing tasks  Seated thoracic extension at chair 10x5 seconds for 3 sets to promote upright  posture  Standing with B feet spread apart     B shoulder horizontal abduction with B scapular retraction 10x with 5 second holds  Session paused secondary to bathroom break 2:36 pm - 246 pm                  B shoulder horizontal abduction with B scapular retraction with R and L trunk rotation with arms in abduction 10x3    B shoulder flexion/extension alternating 10x3                 To promote large movements.       Cues for large movements Improved exercise technique, movement at target joints, use of target muscles after mod verbal, visual, tactile cues.     Response to treatment Pt tolerated session well without aggravation of symptoms.     Clinical impression  6 MWT, DGI, and 5xSTS not assessed today secondary to unsure whether or not pt had a [place] for LE edema. Per phone conversation with staff at Dr. Olin Pia office on 04/13/2022: no rigorous lower body exercises for PT. Called MD office for an update on LE [place] information earlier today and left a message with his receptionist. Have not yet received a return phone call. Worked on upper extremities today to promote thoracic extension (to decrease flexed posture) and large movements. Pt tolerated session well without aggravation of symptoms. Pt will benefit from continued skilled physical therapy services to improve the aforementioned deficits.        PATIENT EDUCATION: Education details: ther-ex, HEP Person educated: Patient Education method: Explanation, Demonstration, Tactile cues, Verbal cues, and Handouts Education comprehension: verbalized understanding and returned demonstration   HOME EXERCISE PROGRAM: Access Code: [email or web address] URL: [email or web address] Date: 12/29/2021 Prepared by: Joneen Boers  Exercises - Standing Hip Abduction with Counter Support  - 1 x daily - 7 x weekly - 2 sets - 10 reps - 5 seconds hold - Sit to Stand with Counter Support  - 1 x daily - 7 x weekly - 2 sets - 5  reps      PT Short Term Goals - 03/23/22 1506       PT SHORT TERM GOAL #1   Title Patient will demonstrate independence with home exercise program for ability to self treat.    Baseline Has been doing his home exercises somewhat. No questions. (02/16/2022); 03/07/22: Performs every other day; Performing his HEP, no questions, seems to be doing him some good per pt (03/23/2022)    Time 2    Period Weeks    Status Achieved    Target Date 03/21/22      PT SHORT TERM GOAL #2   Title 6MWT c LRAD >832f -> (+) IADL;    Baseline 3049feval;  500 ft with rw, CGA, cues for increasing foot clearance and step length and staying closer to the rw (02/16/2022): 03/07/2022: 945' with RW    Time 4    Period Weeks    Status Achieved    Target Date 03/07/22      PT SHORT TERM GOAL #3   Title FOTO>10% increase    Baseline Eval: 48; 03/07/22: 46; 50 (03/23/2022)    Time 4    Period Weeks    Status On-going    Target Date 04/04/22      PT SHORT TERM GOAL #4   Title 5xSTS<13sec s UE chair + airex pad.    Baseline Eval 15sec; 11 seconds with cues for increased speed (02/16/2022);    Time 6    Period Weeks    Status Achieved    Target Date 02/07/22              PT Long Term Goals - 04/21/22 1441       PT LONG TERM GOAL #1   Title Patient will have improved function and activity level as evidenced by an increase in FOTO score by 13 points or more.    Baseline Eval 48; 03/07/22: 46; 50 (03/23/2022)    Time 2    Period Weeks    Status On-going    Target Date 05/19/22      PT LONG TERM GOAL #2   Title 6MWT >1218f c LRAD    Baseline Eval: 3061fin 2m30m; 94573ith RW completing full 6 minutes    Time 8    Period Weeks    Status Deferred    Target Date 05/19/22      PT LONG TERM GOAL #3   Title Pt will improve his DGI score with rw to > 19 points as a demonstration of decreased fall risk.    Baseline DGI with rw: 15 (01/03/2022); 03/07/22: 16 relying on RW; 15, did not use RW today (03/23/2022)     Time 8    Period Weeks    Status Deferred    Target Date 05/19/22      PT LONG TERM GOAL #4   Title 5xSTS<15sec from chair, s BUE    Baseline eval: requires arms from chair height; 03/07/22: 13.32 sec without UE support    Time 10    Period Weeks    Status Achieved    Target Date 03/07/22              Plan - 04/21/22 1419     Clinical Impression Statement 6 MWT, DGI, and 5xSTS not assessed today secondary to unsure whether or not pt had a [place] LE edema. Per phone conversation with staff at Dr. KleOlin Piafice on 04/13/2022: no rigorous lower body exercises for PT. Called MD office for an update on LE [place] earlier today and left a message with his receptionist. Have not yet received a return phone call. Worked on upper extremities today to promote thoracic extension (to decrease flexed posture) and large movements. Pt tolerated session well without aggravation of symptoms. Pt will benefit from continued skilled physical therapy services to improve the aforementioned deficits.    Personal Factors and Comorbidities Age;Past/Current Experience    Comorbidities PD, H/o cancer,    Examination-Activity Limitations Squat;Stairs;Stand;Locomotion Level;Reach Overhead    Examination-Participation Restrictions Cleaning;Medication Management;Interpersonal Relationship;Community Activity    Stability/Clinical Decision Making Stable/Uncomplicated    Clinical Decision Making Low    Rehab Potential Fair    PT  Frequency 2x / week    PT Duration 8 weeks    PT Treatment/Interventions ADLs/Self Care Home Management;Neuromuscular re-education;Balance training;Therapeutic exercise;Orthotic Fit/Training;Prosthetic Training;Patient/family education;Wheelchair mobility training;Manual techniques;Passive range of motion;Energy conservation;Vestibular;Joint Manipulations;Gait training;Therapeutic activities;Functional mobility training;Stair training    Consulted and Agree with Plan of Care Patient                          Thank you for your referral.     Joneen Boers PT, DPT  04/21/2022, 3:56 PM

## 2022-04-22 ENCOUNTER — Encounter: Payer: Self-pay | Admitting: Internal Medicine

## 2022-04-22 ENCOUNTER — Other Ambulatory Visit: Payer: Self-pay

## 2022-04-22 ENCOUNTER — Emergency Department: Payer: Medicare HMO

## 2022-04-22 ENCOUNTER — Observation Stay
Admission: EM | Admit: 2022-04-22 | Discharge: 2022-04-27 | Disposition: A | Discharge status: Skilled Nursing Facility | Payer: Medicare HMO | Attending: Obstetrics and Gynecology | Admitting: Obstetrics and Gynecology

## 2022-04-22 DIAGNOSIS — G9341 Metabolic encephalopathy: Secondary | ICD-10-CM | POA: Diagnosis not present

## 2022-04-22 DIAGNOSIS — Z85828 Personal history of other malignant neoplasm of skin: Secondary | ICD-10-CM | POA: Diagnosis not present

## 2022-04-22 DIAGNOSIS — G20A1 Parkinson's disease without dyskinesia, without mention of fluctuations: Secondary | ICD-10-CM | POA: Diagnosis present

## 2022-04-22 DIAGNOSIS — Z9682 Presence of neurostimulator: Secondary | ICD-10-CM | POA: Diagnosis not present

## 2022-04-22 DIAGNOSIS — R509 Fever, unspecified: Secondary | ICD-10-CM | POA: Diagnosis present

## 2022-04-22 DIAGNOSIS — R410 Disorientation, unspecified: Secondary | ICD-10-CM | POA: Diagnosis not present

## 2022-04-22 DIAGNOSIS — N179 Acute kidney failure, unspecified: Secondary | ICD-10-CM | POA: Diagnosis present

## 2022-04-22 DIAGNOSIS — Z9689 Presence of other specified functional implants: Secondary | ICD-10-CM

## 2022-04-22 DIAGNOSIS — Z79899 Other long term (current) drug therapy: Secondary | ICD-10-CM | POA: Insufficient documentation

## 2022-04-22 DIAGNOSIS — R4182 Altered mental status, unspecified: Secondary | ICD-10-CM | POA: Diagnosis present

## 2022-04-22 DIAGNOSIS — E860 Dehydration: Secondary | ICD-10-CM | POA: Insufficient documentation

## 2022-04-22 DIAGNOSIS — I5040 Unspecified combined systolic (congestive) and diastolic (congestive) heart failure: Secondary | ICD-10-CM | POA: Diagnosis not present

## 2022-04-22 DIAGNOSIS — Z8616 Personal history of COVID-19: Secondary | ICD-10-CM

## 2022-04-22 DIAGNOSIS — Z87891 Personal history of nicotine dependence: Secondary | ICD-10-CM | POA: Insufficient documentation

## 2022-04-22 DIAGNOSIS — G20 Parkinson's disease: Secondary | ICD-10-CM | POA: Diagnosis not present

## 2022-04-22 DIAGNOSIS — U071 COVID-19: Principal | ICD-10-CM | POA: Diagnosis present

## 2022-04-22 DIAGNOSIS — R531 Weakness: Secondary | ICD-10-CM | POA: Diagnosis not present

## 2022-04-22 DIAGNOSIS — F32A Depression, unspecified: Secondary | ICD-10-CM | POA: Diagnosis present

## 2022-04-22 DIAGNOSIS — F0280 Dementia in other diseases classified elsewhere without behavioral disturbance: Secondary | ICD-10-CM | POA: Insufficient documentation

## 2022-04-22 DIAGNOSIS — R4189 Other symptoms and signs involving cognitive functions and awareness: Secondary | ICD-10-CM | POA: Diagnosis present

## 2022-04-22 HISTORY — DX: Personal history of COVID-19: Z86.16

## 2022-04-22 LAB — CBC
HCT: 41.3 % (ref 39.0–52.0)
Hemoglobin: 13.5 g/dL (ref 13.0–17.0)
MCH: 30.6 pg (ref 26.0–34.0)
MCHC: 32.7 g/dL (ref 30.0–36.0)
MCV: 93.7 fL (ref 80.0–100.0)
Platelets: 145 10*3/uL — ABNORMAL LOW (ref 150–400)
RBC: 4.41 MIL/uL (ref 4.22–5.81)
RDW: 13.6 % (ref 11.5–15.5)
WBC: 6.5 10*3/uL (ref 4.0–10.5)
nRBC: 0 % (ref 0.0–0.2)

## 2022-04-22 LAB — URINALYSIS, COMPLETE (UACMP) WITH MICROSCOPIC
Bacteria, UA: NONE SEEN
Bilirubin Urine: NEGATIVE
Glucose, UA: NEGATIVE mg/dL
Hgb urine dipstick: NEGATIVE
Ketones, ur: NEGATIVE mg/dL
Leukocytes,Ua: NEGATIVE
Nitrite: NEGATIVE
Protein, ur: NEGATIVE mg/dL
Specific Gravity, Urine: 1.004 — ABNORMAL LOW (ref 1.005–1.030)
Squamous Epithelial / HPF: NONE SEEN (ref 0–5)
pH: 6 (ref 5.0–8.0)

## 2022-04-22 LAB — RESPIRATORY PANEL BY PCR

## 2022-04-22 LAB — RESP PANEL BY RT-PCR (FLU A&B, COVID) ARPGX2
Influenza A by PCR: NEGATIVE
Influenza B by PCR: NEGATIVE
SARS Coronavirus 2 by RT PCR: POSITIVE — AB

## 2022-04-22 LAB — COMPREHENSIVE METABOLIC PANEL
ALT: 11 U/L (ref 0–44)
AST: 19 U/L (ref 15–41)
Albumin: 3.9 g/dL (ref 3.5–5.0)
Alkaline Phosphatase: 80 U/L (ref 38–126)
Anion gap: 6 (ref 5–15)
BUN: 18 mg/dL (ref 8–23)
CO2: 25 mmol/L (ref 22–32)
Calcium: 9.2 mg/dL (ref 8.9–10.3)
Chloride: 108 mmol/L (ref 98–111)
Creatinine, Ser: 1.27 mg/dL — ABNORMAL HIGH (ref 0.61–1.24)
GFR, Estimated: 59 mL/min — ABNORMAL LOW (ref >60)
Glucose, Bld: 101 mg/dL — ABNORMAL HIGH (ref 70–99)
Potassium: 3.8 mmol/L (ref 3.5–5.1)
Sodium: 139 mmol/L (ref 135–145)
Total Bilirubin: 1.4 mg/dL — ABNORMAL HIGH (ref 0.3–1.2)
Total Protein: 7.1 g/dL (ref 6.5–8.1)

## 2022-04-22 LAB — PHOSPHORUS: Phosphorus: 3.4 mg/dL (ref 2.5–4.6)

## 2022-04-22 LAB — LACTIC ACID, PLASMA: Lactic Acid, Venous: 0.7 mmol/L (ref 0.5–1.9)

## 2022-04-22 LAB — PROCALCITONIN: Procalcitonin: <0.1 ng/mL

## 2022-04-22 LAB — MAGNESIUM: Magnesium: 2.3 mg/dL (ref 1.7–2.4)

## 2022-04-22 MED ORDER — SODIUM CHLORIDE 0.9 % IV SOLN: INTRAVENOUS | Status: AC

## 2022-04-22 MED ORDER — GUAIFENESIN-DM 100-10 MG/5ML PO SYRP: 10.0000 mL | ORAL_SOLUTION | ORAL | Status: DC | PRN

## 2022-04-22 MED ORDER — MELATONIN 5 MG PO TABS
10.0000 mg | ORAL_TABLET | Freq: Every day | ORAL | Status: DC
  Administered 2022-04-22 – 2022-04-26 (×5): 10 mg via ORAL
  Filled 2022-04-22 (×6): qty 2

## 2022-04-22 MED ORDER — SODIUM CHLORIDE 0.9 % IV BOLUS
500.0000 mL | Freq: Once | INTRAVENOUS | Status: AC
  Administered 2022-04-22: 500 mL via INTRAVENOUS

## 2022-04-22 MED ORDER — VITAMIN C 500 MG PO TABS
500.0000 mg | ORAL_TABLET | Freq: Every day | ORAL | Status: DC
  Administered 2022-04-24 – 2022-04-27 (×4): 500 mg via ORAL
  Filled 2022-04-22 (×5): qty 1

## 2022-04-22 MED ORDER — ACETAMINOPHEN 325 MG PO TABS: 650.0000 mg | ORAL_TABLET | Freq: Four times a day (QID) | ORAL | Status: AC | PRN

## 2022-04-22 MED ORDER — IBUPROFEN 600 MG PO TABS
600.0000 mg | ORAL_TABLET | Freq: Once | ORAL | Status: AC
  Administered 2022-04-22: 600 mg via ORAL
  Filled 2022-04-22: qty 1

## 2022-04-22 MED ORDER — ACETAMINOPHEN 650 MG RE SUPP: 650.0000 mg | Freq: Four times a day (QID) | RECTAL | Status: AC | PRN

## 2022-04-22 MED ORDER — ENOXAPARIN SODIUM 40 MG/0.4ML IJ SOSY
40.0000 mg | PREFILLED_SYRINGE | Freq: Every day | INTRAMUSCULAR | Status: DC
  Administered 2022-04-22 – 2022-04-26 (×5): 40 mg via SUBCUTANEOUS
  Filled 2022-04-22 (×5): qty 0.4

## 2022-04-22 MED ORDER — QUETIAPINE FUMARATE 25 MG PO TABS
25.0000 mg | ORAL_TABLET | Freq: Every day | ORAL | Status: AC
  Administered 2022-04-22 – 2022-04-26 (×5): 25 mg via ORAL
  Filled 2022-04-22 (×5): qty 1

## 2022-04-22 MED ORDER — ZINC SULFATE 220 (50 ZN) MG PO CAPS
220.0000 mg | ORAL_CAPSULE | Freq: Every day | ORAL | Status: DC
  Administered 2022-04-24 – 2022-04-27 (×4): 220 mg via ORAL
  Filled 2022-04-22 (×5): qty 1

## 2022-04-22 MED ORDER — ADULT MULTIVITAMIN W/MINERALS CH
1.0000 | ORAL_TABLET | Freq: Every day | ORAL | Status: DC
  Administered 2022-04-23 – 2022-04-27 (×5): 1 via ORAL
  Filled 2022-04-22 (×5): qty 1

## 2022-04-22 MED ORDER — TAMSULOSIN HCL 0.4 MG PO CAPS
0.4000 mg | ORAL_CAPSULE | Freq: Every day | ORAL | Status: DC
  Administered 2022-04-22 – 2022-04-27 (×6): 0.4 mg via ORAL
  Filled 2022-04-22 (×6): qty 1

## 2022-04-22 MED ORDER — ONDANSETRON HCL 4 MG/2ML IJ SOLN
4.0000 mg | Freq: Four times a day (QID) | INTRAMUSCULAR | Status: AC | PRN
Start: 2022-04-22 — End: 2022-04-27

## 2022-04-22 MED ORDER — HYDROCOD POLI-CHLORPHE POLI ER 10-8 MG/5ML PO SUER: 5.0000 mL | Freq: Every evening | ORAL | Status: DC | PRN

## 2022-04-22 MED ORDER — PANTOPRAZOLE SODIUM 40 MG PO TBEC
40.0000 mg | DELAYED_RELEASE_TABLET | Freq: Every day | ORAL | Status: DC
  Administered 2022-04-23 – 2022-04-27 (×5): 40 mg via ORAL
  Filled 2022-04-22 (×5): qty 1

## 2022-04-22 MED ORDER — ONDANSETRON HCL 4 MG PO TABS
4.0000 mg | ORAL_TABLET | Freq: Four times a day (QID) | ORAL | Status: AC | PRN
Start: 2022-04-22 — End: 2022-04-27

## 2022-04-22 MED ORDER — CARBIDOPA-LEVODOPA 25-100 MG PO TABS
2.0000 | ORAL_TABLET | Freq: Four times a day (QID) | ORAL | Status: DC
  Administered 2022-04-22 – 2022-04-27 (×20): 2 via ORAL
  Filled 2022-04-22 (×23): qty 2

## 2022-04-22 MED ORDER — DONEPEZIL HCL 5 MG PO TABS
10.0000 mg | ORAL_TABLET | Freq: Every day | ORAL | Status: DC
  Administered 2022-04-22 – 2022-04-26 (×5): 10 mg via ORAL
  Filled 2022-04-22 (×7): qty 2

## 2022-04-22 MED ORDER — HYDROCOD POLI-CHLORPHE POLI ER 10-8 MG/5ML PO SUER: 5.0000 mL | Freq: Two times a day (BID) | ORAL | Status: DC | PRN

## 2022-04-22 MED ORDER — NIRMATRELVIR/RITONAVIR (PAXLOVID) TABLET (RENAL DOSING)
1.0000 | ORAL_TABLET | Freq: Two times a day (BID) | ORAL | Status: DC
  Administered 2022-04-22 – 2022-04-25 (×7): 1 via ORAL
  Filled 2022-04-22 (×2): qty 20

## 2022-04-22 MED ORDER — SENNOSIDES-DOCUSATE SODIUM 8.6-50 MG PO TABS
1.0000 | ORAL_TABLET | Freq: Every evening | ORAL | Status: DC | PRN
Start: 2022-04-22 — End: 2022-04-23

## 2022-04-22 MED ORDER — SERTRALINE HCL 50 MG PO TABS
100.0000 mg | ORAL_TABLET | Freq: Every day | ORAL | Status: DC
  Administered 2022-04-23 – 2022-04-27 (×5): 100 mg via ORAL
  Filled 2022-04-22 (×5): qty 2

## 2022-04-22 MED ORDER — CARBIDOPA-LEVODOPA 25-100 MG PO TABS
2.0000 | ORAL_TABLET | Freq: Four times a day (QID) | ORAL | Status: DC
  Filled 2022-04-22: qty 2

## 2022-04-22 MED ORDER — ALBUTEROL SULFATE HFA 108 (90 BASE) MCG/ACT IN AERS: 2.0000 | INHALATION_SPRAY | Freq: Four times a day (QID) | RESPIRATORY_TRACT | Status: DC | PRN

## 2022-04-22 NOTE — Plan of Care (Signed)

## 2022-04-22 NOTE — Assessment & Plan Note (Signed)
With hypersexuality - Continue sertraline 100 mg daily

## 2022-04-22 NOTE — Assessment & Plan Note (Addendum)
Presumed secondary to COVID-19 infection --Mgmt as outlined --Delirium precautions --Neuro checks --PT / OOB / mobilize as much as tolerated

## 2022-04-22 NOTE — ED Provider Notes (Signed)
.     Delman Kitten, MD 04/22/22 1255

## 2022-04-22 NOTE — H&P (Addendum)
History and Physical   Wesley Anderson. LMB:[id number] DOB: 12/15/1945 DOA: 04/22/2022  PCP: Adin Hector, MD  Outpatient Specialists: Dr. Leanna Sato clinic cardiology Patient coming from: Home via EMS  I have personally briefly reviewed patient's old medical records in [place].  Chief Concern: Confusion  HPI: 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  Initial vitals in the emergency department showed temperature of 99.7, respiration rate of 24, heart rate of 77, blood pressure 119/69, SPO2 96% on room air.  Serum sodium is 139, potassium 3.8, chloride 108, bicarb 25, BUN of 18, serum creatinine 1.27, GFR 59, nonfasting blood glucose 101, WBC 6.5, hemoglobin 13.5, platelets of 145.  Lactic acid was 0.7.  Procalcitonin was less than 0.10.  UA was negative for nitrates and leukocytes.  COVID PCR was tested positive.  20 pathogen panel ordered in the ED is pending.  ED treatment: Ibuprofen 600 mg p.o. one-time dose, sodium chloride 500 mL bolus.  At bedside he was able to tell me his full name, the current calendar year and the current month with difficulty and he knows he is in the hospital at [facility].  He was not able to tell me his age accurately.  He states he was 83 or 75.  He was able to identify his wife at bedside. He reports he feels better now than he did this morning.  Spouse at bedside reports that this morning he forgot how to use the wheelchair.  She reports that at baseline he uses wheelchair and a walker to walk.  He can walk on his own however in the past he has forgotten how to walk sometimes and thus she prefers that he walks with a walker.  She reports fever of 100.7 at home.  He endorses  chills and poor p.o. intake.  He denies nausea, vomiting, diarrhea, dysuria pain, shortness of breath.  He has had poor PO intake for the past several days. He reports the food just does not taste the same.   Social history: He lives at home with his wife.  She denies current tobacco, EtOH, recreational drug use.  ROS: Constitutional: no weight change, + fever, + chills ENT/Mouth: no sore throat, no rhinorrhea Eyes: no eye pain, no vision changes Cardiovascular: no chest pain, no dyspnea,  no edema, no palpitations Respiratory: no cough, no sputum, no wheezing Gastrointestinal: no nausea, no vomiting, no diarrhea, no constipation Genitourinary: no urinary incontinence, no dysuria, no hematuria Musculoskeletal: no arthralgias, no myalgias Skin: no skin lesions, no pruritus, Neuro: + weakness, no loss of consciousness, no syncope Psych: no anxiety, no depression, + decrease appetite Heme/Lymph: no bruising, no bleeding  ED Course: Discussed with emergency medicine provider, patient requiring hospitalization for chief concerns of altered mental status, COVID-19 infection.  Assessment/Plan  Principal Problem:   AKI (acute kidney injury) ([place]) Active Problems:   Parkinson's disease ([place])   Cognitive impairment   Acute metabolic encephalopathy   S/P deep brain stimulator placement   Weakness   Fever and chills   Confusion   COVID-19 virus infection   Depression   Assessment and Plan:  * AKI (acute kidney injury) ([place]) - Status post sodium chloride 500 mL bolus per ED - We will give sodium chloride IVF at 100 mL/h, 5  hours ordered - Encourage p.o. intake - Recheck BMP in the a.m.  Depression With hypersexuality - Continue sertraline 100 mg daily  COVID-19 virus infection - Paxlovid, renal dosing initiated due to acute kidney injury - Vitamin C and zinc ordered - Symptomatic support: Albuterol 2 puff inhalation every 6 hours as needed for wheezing and shortness of breath,  Tussionex 5 mg nightly as needed for cough; guaifenesin-dextromethorphan 10 mg p.o. every 4 hours as needed for cough  S/P deep brain stimulator placement - In 5409  Acute metabolic encephalopathy - Presumed secondary to COVID-19 infection - Treat per COVID-19  Cognitive impairment - Resumed home donepezil 10 mg nightly  Parkinson's disease ([place]) With Parkinson's dementia - Resumed home carbidopa-levodopa 25-100 mg tablet, 2 tablets, 4 times daily with next dosing at 1400 - Confirmed with spouse at bedside regarding appropriate dosing - Seroquel 75 mg nightly has been held on admission and I have decreased the dosing to Seroquel 25 mg nightly at the recommendation of pharmacy in setting of Paxlovid initiation  Bilateral lower extremity with 1-2+ pitting edema -Per spouse at bedside this is patient's baseline - PCP has avoided furosemide and potassium supplementation due to concerns of interactions with home donepezil  Chart reviewed.   DVT prophylaxis: Enoxaparin 40 mg subcutaneous nightly Code Status: full code  Diet: Heart healthy Family Communication: Updated spouse at bedside Disposition Plan: Pending clinical course, anticipate discharge in 2 nights or less Consults called: None at this time Admission status: Admit to [place], observation  Past Medical History:  Diagnosis Date   AAA (abdominal aortic aneurysm) ([place])    Alcohol dependence in remission ([place])    Cancer ([place])    skin    Colon polyps    ED (erectile dysfunction)    GERD (gastroesophageal reflux disease)    Hematest positive stools    Melanoma of skin ([place])    Parkinson's disease ([place])    Renal cyst, right    Restless leg syndrome    Past Surgical History:  Procedure Laterality Date   CHOLECYSTECTOMY     COLON SURGERY     colectomy with anastamosis   COLONOSCOPY WITH PROPOFOL N/A 11/07/2016   Procedure: COLONOSCOPY WITH PROPOFOL;  Surgeon: Lollie Sails, MD;  Location: [facility] ENDOSCOPY;  Service:  Endoscopy;  Laterality: N/A;   COLONOSCOPY WITH PROPOFOL N/A 09/06/2018   Procedure: COLONOSCOPY WITH PROPOFOL;  Surgeon: Lollie Sails, MD;  Location: [facility] ENDOSCOPY;  Service: Endoscopy;  Laterality: N/A;   COLONOSCOPY WITH PROPOFOL N/A 05/16/2019   Procedure: COLONOSCOPY WITH PROPOFOL;  Surgeon: Lollie Sails, MD;  Location: [facility] ENDOSCOPY;  Service: Endoscopy;  Laterality: N/A;   CYST EXCISION     vocal cord   DBS placement     deep brain implant     ESOPHAGOGASTRODUODENOSCOPY (EGD) WITH PROPOFOL N/A 09/06/2018   Procedure: ESOPHAGOGASTRODUODENOSCOPY (EGD) WITH PROPOFOL;  Surgeon: Lollie Sails, MD;  Location: [facility] ENDOSCOPY;  Service: Endoscopy;  Laterality: N/A;   MELANOMA EXCISION     TONSILLECTOMY     Social History:  reports that he has quit smoking. He has never used smokeless tobacco. He reports current alcohol use of about 6.0 standard drinks of alcohol per week. He reports that he does not currently use drugs.  Allergies  Allergen Reactions   Artane [Trihexyphenidyl] Anxiety   Propofol Other (See Comments)    Delirium Delirium Delirium Delirium    Family History  Problem Relation Age of Onset   Diabetes Father    Family  history: Family history reviewed and not pertinent.  Prior to Admission medications   Medication Sig Start Date End Date Taking? Authorizing Provider  carbidopa-levodopa (SINEMET IR) 25-100 MG tablet Take 2 tablets by mouth 4 (four) times daily. 08/27/20  Yes [provider]  donepezil (ARICEPT) 10 MG tablet Take 10 mg by mouth daily. 08/06/20  Yes [provider]  melatonin 3 MG TABS tablet Take 9 mg by mouth at bedtime.   Yes [provider]  Multiple Vitamin (MULTIVITAMIN) tablet Take 1 tablet by mouth daily.   Yes [provider]  pantoprazole (PROTONIX) 40 MG tablet Take 40 mg by mouth daily.   Yes [provider]  QUEtiapine (SEROQUEL) 25 MG tablet Take 75 mg by mouth at bedtime. 07/14/20   Yes [provider]  sertraline (ZOLOFT) 100 MG tablet Take 100 mg by mouth daily. 01/29/22  Yes [provider]  tamsulosin (FLOMAX) 0.4 MG CAPS capsule Take 0.4 mg by mouth daily. 07/03/21  Yes [provider]  bacitracin 500 UNIT/GM ointment Apply 1 application topically 2 (two) times daily. Patient not taking: Reported on 04/22/2022 07/05/20   [provider]  ciprofloxacin (CIPRO) 500 MG tablet Take 1 tablet (500 mg total) by mouth 2 (two) times daily. Patient not taking: Reported on 04/22/2022 08/11/21   Little Ishikawa, MD  furosemide (LASIX) 20 MG tablet Take 20 mg by mouth daily as needed. Patient not taking: Reported on 04/22/2022 03/31/22   [provider]  KLOR-CON M10 10 MEQ tablet Take 10 mEq by mouth daily as needed. Patient not taking: Reported on 04/22/2022 03/31/22   [provider]  metroNIDAZOLE (FLAGYL) 500 MG tablet Take 1 tablet (500 mg total) by mouth 2 (two) times daily. Patient not taking: Reported on 04/22/2022 08/11/21   Little Ishikawa, MD  Misc Natural Products (ELDERBERRY ZINC/VIT C/IMMUNE) LOZG Use as directed 1 lozenge in the mouth or throat daily.    [provider]  sertraline (ZOLOFT) 50 MG tablet Take 50 mg by mouth daily. Patient not taking: Reported on 04/22/2022 06/26/21   [provider]  sildenafil (VIAGRA) 100 MG tablet Take 100 mg by mouth daily as needed for erectile dysfunction. Patient not taking: Reported on 04/22/2022    [provider]  urea (CARMOL) 40 % CREA Apply topically daily. Patient not taking: Reported on 04/22/2022    [provider]   Physical Exam: Vitals:   04/22/22 1145 04/22/22 1238 04/22/22 1238 04/22/22 1245  BP:   (!) 152/80   Pulse: (!) 49 (!) 53 (!) 53 (!) 54  Resp: '17 16 17 12  '$ Temp:   99 F (37.2 C)   TempSrc:      SpO2: 95% 97% 98% 97%  Weight:   80.1 kg   Height:   '5\' 7"'$  (1.702 m)    Constitutional: appears age-appropriate,  frail, NAD, calm, comfortable Eyes: PERRL, lids and conjunctivae normal ENMT: Mucous membranes are moist. Posterior pharynx clear of any exudate or lesions. Age-appropriate dentition. Hearing appropriate Neck: normal, supple, no masses, no thyromegaly Respiratory: clear to auscultation bilaterally, no wheezing, no crackles. Normal respiratory effort. No accessory muscle use.  Cardiovascular: Regular rate and rhythm, no murmurs / rubs / gallops. 2+ pedal pulses. No carotid bruits.  Bilateral lower extremity 1-2+ swelling with pitting edema Abdomen: no tenderness, no masses palpated, no hepatosplenomegaly. Bowel sounds positive.  Musculoskeletal: no clubbing / cyanosis. No joint deformity upper and lower extremities. Good ROM, no contractures, no atrophy.  Normal muscle tone.  Skin: no rashes, lesions, ulcers. No induration Neurologic: Sensation intact. Strength 5/5 in all 4.  Generalized bilateral upper extremity tremors present Psychiatric: Normal judgment and insight. Alert and oriented x 3.  Flat mood.  Flat affect  EKG: Ordered and discussed with nursing staff.  Chest x-ray on Admission: I personally reviewed and I agree with radiologist reading as below.  DG Chest 2 View  Result Date: 04/22/2022 CLINICAL DATA:  slight cough, fever EXAM: CHEST - 2 VIEW COMPARISON:  Radiograph 08/08/2021 FINDINGS: Unchanged cardiomediastinal silhouette. There is no focal airspace consolidation. There is no pleural effusion. No pneumothorax. There is no acute osseous abnormality. Thoracic spondylosis. Bilateral shoulder degenerative changes. Unchanged nerve stimulator overlying the right chest. IMPRESSION: No evidence of acute cardiopulmonary disease. Electronically Signed   By: Maurine Simmering M.D.   On: 04/22/2022 09:21    Labs on Admission: I have personally reviewed following labs  CBC: Recent Labs  Lab 04/22/22 0845  WBC 6.5  HGB 13.5  HCT 41.3  MCV 93.7  PLT 630*   Basic Metabolic Panel: Recent Labs   Lab 04/22/22 0845  NA 139  K 3.8  CL 108  CO2 25  GLUCOSE 101*  BUN 18  CREATININE 1.27*  CALCIUM 9.2   GFR: Estimated Creatinine Clearance: 51.8 mL/min (A) (by C-G formula based on SCr of 1.27 mg/dL (H)).  Liver Function Tests: Recent Labs  Lab 04/22/22 0845  AST 19  ALT 11  ALKPHOS 80  BILITOT 1.4*  PROT 7.1  ALBUMIN 3.9   Urine analysis:    Component Value Date/Time   COLORURINE STRAW (A) 04/22/2022 0805   APPEARANCEUR CLEAR (A) 04/22/2022 0805   LABSPEC 1.004 (L) 04/22/2022 0805   PHURINE 6.0 04/22/2022 0805   GLUCOSEU NEGATIVE 04/22/2022 0805   HGBUR NEGATIVE 04/22/2022 0805   BILIRUBINUR NEGATIVE 04/22/2022 0805   KETONESUR NEGATIVE 04/22/2022 0805   PROTEINUR NEGATIVE 04/22/2022 0805   NITRITE NEGATIVE 04/22/2022 0805   LEUKOCYTESUR NEGATIVE 04/22/2022 0805   Dr. Tobie Poet [facility]  If 7PM-7AM, please contact overnight-coverage provider If 7AM-7PM, please contact day coverage provider [email or web address]  04/22/2022, 2:10 PM

## 2022-04-22 NOTE — Assessment & Plan Note (Addendum)
Continue Paxlovid. No steroids indicated, not hypoxic. Vitamin C and zinc. Supportive care: bronchodilators PRN, Tussionex, Mucinex

## 2022-04-22 NOTE — Assessment & Plan Note (Signed)
--  continue donepezil 10 mg nightly

## 2022-04-22 NOTE — Assessment & Plan Note (Signed)
Given 500 mL bolus in ED, then continued on NS at 100 mL/h x 5 hours. Cr improved from 1.27 >> 1.10 today. -- Monitor off IV fluids - Encourage p.o. hydration - BMP in AM

## 2022-04-22 NOTE — Progress Notes (Signed)
04/22/2022 at 2200:  Pt has called out to front desk numerous times throughout shift. RN and NT have reported to pt bedside numerous times. Pt was educated on proper use of call bell. Pt continues to call out. This RN will continue to educate pt on proper use of call bell. Pt is resting and call bell within reach.

## 2022-04-22 NOTE — ED Provider Notes (Signed)
[facility] Provider Note    Event Date/Time   First MD Initiated Contact with Patient 04/22/22 [phone]     (approximate)   History   Altered Mental Status (Patient C/O altered mental status that began approximately 8:00pm. Patient does have some underlying memory issues per EMS, but states mentation has worsened)   HPI  Wesley Anderson. is a 76 y.o. male who on review of previous medical records has a history of Parkinson's disease with cognitive impairment, encephalopathy, deep brain stimulator, malignant neoplasm, fevers weakness myalgias influenza A   Reviewed discharge summary from December 28 of this year at that time patient was treated for sepsis secondary to gastroenteritis versus acute diverticulosis (?)  Reviewed primary care note, patiently recently referred for concerns of possible mild congestive heart failure by Dr. Caryl Comes  Patient was in his normal health went out to dinner last night with family at [place].  Then during the evening he started to feel slightly chilled and a little bit confused.  This morning he woke up about 4 AM and seemed a bit confused as to how he uses his wheelchair.  That seem to clear up, but he has had a slight sore throat, and felt more fatigued than typical, and had a fever of 100.7 for which she took Tylenol prior to coming via EMS.  He reports no headache.  Reports a slight scratchiness in his throat or slightly sore throat that is now gone away.  He has had a very slight cough.  No shortness of breath.  He has had no nausea no vomiting no abdominal pain.  He has been urinating fairly frequently more so than typical, and his wife advises she does not think he has been taking an as much water as normal but again 8 pretty well at the country [place] last [place]  No new numbness or weakness.  He has Parkinson's disease  Physical Exam   Triage Vital Signs: ED Triage Vitals  Enc Vitals Group     BP 04/22/22  0654 127/81     Pulse Rate 04/22/22 0654 69     Resp 04/22/22 0654 18     Temp 04/22/22 0654 99.7 F (37.6 C)     Temp Source 04/22/22 0654 Oral     SpO2 04/22/22 0654 96 %     Weight --      Height --      Head Circumference --      Peak Flow --      Pain Score 04/22/22 0655 0     Pain Loc --      Pain Edu? --      Excl. in [place]? --     Most recent vital signs: Vitals:   04/22/22 1238 04/22/22 1238  BP:  (!) 152/80  Pulse: (!) 53 (!) 53  Resp: 16 17  Temp:  99 F (37.2 C)  SpO2: 97% 98%     General: Awake, no distress.  He is well oriented at present.  Per patient as well as his wife his confusion has resolved.  He just seemed a little bit generally confused around 4 AM but that seems to have gone away. Normocephalic atraumatic.  He moves all extremities well.  Has a resting tremor in the right upper extremity. CV:  Good peripheral perfusion.  Normal heart tones.  No noted murmurs Resp:  Normal effort.  Clear bilaterally.  No respiratory distress.  Normal oxygenation on room air  Abd:  No distention.  Soft nontender nondistended in all quadrants, reports to palpation of the suprapubic region he feels an urgency to void but otherwise normal.  Denies difficulty emptying the bladder and uses a depends at baseline due to slight incontinence that he has chronically Other:  No rashes noted.  No notable peripheral edema at this time   ED Results / Procedures / Treatments   Labs (all labs ordered are listed, but only abnormal results are displayed) Labs Reviewed  RESP PANEL BY RT-PCR (FLU A&B, COVID) ARPGX2 - Abnormal; Notable for the following components:      Result Value   SARS Coronavirus 2 by RT PCR POSITIVE (*)    All other components within normal limits  CBC - Abnormal; Notable for the following components:   Platelets 145 (*)    All other components within normal limits  COMPREHENSIVE METABOLIC PANEL - Abnormal; Notable for the following components:   Glucose, Bld 101  (*)    Creatinine, Ser 1.27 (*)    Total Bilirubin 1.4 (*)    GFR, Estimated 59 (*)    All other components within normal limits  URINALYSIS, COMPLETE (UACMP) WITH MICROSCOPIC - Abnormal; Notable for the following components:   Color, Urine STRAW (*)    APPearance CLEAR (*)    Specific Gravity, Urine 1.004 (*)    All other components within normal limits  RESPIRATORY PANEL BY PCR  CULTURE, BLOOD (ROUTINE X 2)  CULTURE, BLOOD (ROUTINE X 2)  URINE CULTURE  PROCALCITONIN  LACTIC ACID, PLASMA     EKG  Is interpreted by me at 7:30 AM Heart rate 75 QRS 90 QTc 420 Interpretation is of course limited as the patient has a deep brain stimulator unit that is currently on.  Based on my interpretation though, I do not clearly see any evidence of acute ischemia and the patient is clearly in normal sinus rhythm with occasional PVC.   RADIOLOGY  X-ray interpreted as negative for acute finding   PROCEDURES:  Critical Care performed: No  Procedures   MEDICATIONS ORDERED IN ED: Medications  sodium chloride 0.9 % bolus 500 mL (0 mLs Intravenous Stopped 04/22/22 0949)  ibuprofen (ADVIL) tablet 600 mg (600 mg Oral Given 04/22/22 0842)     IMPRESSION / MDM / ASSESSMENT AND PLAN / ED COURSE  I reviewed the triage vital signs and the nursing notes.                              Differential diagnosis includes, but is not limited to, brief episode of confusion which seems to have now resolved, there is no features that make me think it was of central neurologic cause, and today he does have a low-grade fever of 99.7, and had a fever evidently of 100.7 with EMS but took Tylenol prior to arrival.  He is alert well oriented no headache no meningismus.  Reports he had slight scratchy throat but his posterior oropharynx is clear without evidence of injection mass edema or tonsillitis at this time.  Mucous membranes are just slightly dry.  Lung sounds clear, occasional very slight dry cough.  Abdomen  soft nontender no abdominal complaints.  No diarrhea.  Differential diagnosis is broad but primarily focused on a possible infectious etiology suspect need to rule out causes such as acute bacterial infection, urinary tract infection, pneumonia, etc.  I do not have cause to believe he is suffering from acute encephalitis  or meningitis at this point especially given the lack of headache, meningismus photophobia etc.  Patient's presentation is most consistent with acute presentation with potential threat to life or bodily function.  The patient is on the cardiac monitor to evaluate for evidence of arrhythmia and/or significant heart rate changes.  ----------------------------------------- 12:34 PM on 04/22/2022 ----------------------------------------- Patient alert, appears to be feeling fairly well.  Urinalysis reviewed normal.  CBC and metabolic panel without acute abnormality.  Minimally elevated bilirubin which appears to be chronic in nature  Procalcitonin is low arguing against acute bacterial pulmonary infection.  COVID-19 virus has tested positive  Patient's clinical symptoms consistent with COVID-19 and I suspect a mild element of encephalopathy and confusion related.  Discussed with patient and his wife, will admit the patient for COVID-specific treatment and further care under the hospitalist service due to his diagnosis of COVID-19 and the confusion that is brought about as well as level of fatigue with underlying comorbidities  Consulted with and discussed case with hospitalist Dr. Tobie Poet     FINAL CLINICAL IMPRESSION(S) / ED DIAGNOSES   Final diagnoses:  Dehydration  Delirium  COVID-19     Rx / DC Orders   ED Discharge Orders     None        Note:  This document was prepared using Dragon voice recognition software and may include unintentional dictation errors.   Delman Kitten, MD 04/22/22 [phone]

## 2022-04-22 NOTE — Assessment & Plan Note (Addendum)
With Parkinson's dementia Continue home Sinemet Seroquel 75 mg nightly was decreased to 25 mg nightly at the recommendation of pharmacy in setting of Paxlovid initiation, due to drug interactions.

## 2022-04-22 NOTE — Assessment & Plan Note (Signed)
-   In 2015

## 2022-04-22 NOTE — Hospital Course (Signed)
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  Initial vitals in the emergency department showed temperature of 99.7, respiration rate of 24, heart rate of 77, blood pressure 119/69, SPO2 96% on room air.  Serum sodium is 139, potassium 3.8, chloride 108, bicarb 25, BUN of 18, serum creatinine 1.27, GFR 59, nonfasting blood glucose 101, WBC 6.5, hemoglobin 13.5, platelets of 145.  Lactic acid was 0.7.  Procalcitonin was less than 0.10.  UA was negative for nitrates and leukocytes.  COVID PCR was tested positive.  20 pathogen panel ordered in the ED is pending.  ED treatment: Ibuprofen 600 mg p.o. one-time dose, sodium chloride 500 mL bolus.

## 2022-04-23 DIAGNOSIS — U071 COVID-19: Secondary | ICD-10-CM

## 2022-04-23 LAB — URINE CULTURE: Culture: NO GROWTH

## 2022-04-23 LAB — BASIC METABOLIC PANEL
Anion gap: 6 (ref 5–15)
BUN: 16 mg/dL (ref 8–23)
CO2: 24 mmol/L (ref 22–32)
Calcium: 8.9 mg/dL (ref 8.9–10.3)
Chloride: 110 mmol/L (ref 98–111)
Creatinine, Ser: 1.1 mg/dL (ref 0.61–1.24)
GFR, Estimated: >60 mL/min (ref >60)
Glucose, Bld: 96 mg/dL (ref 70–99)
Potassium: 3.9 mmol/L (ref 3.5–5.1)
Sodium: 140 mmol/L (ref 135–145)

## 2022-04-23 LAB — CBC
HCT: 38.1 % — ABNORMAL LOW (ref 39.0–52.0)
Hemoglobin: 12.8 g/dL — ABNORMAL LOW (ref 13.0–17.0)
MCH: 30.8 pg (ref 26.0–34.0)
MCHC: 33.6 g/dL (ref 30.0–36.0)
MCV: 91.6 fL (ref 80.0–100.0)
Platelets: 143 10*3/uL — ABNORMAL LOW (ref 150–400)
RBC: 4.16 MIL/uL — ABNORMAL LOW (ref 4.22–5.81)
RDW: 13.4 % (ref 11.5–15.5)
WBC: 7.2 10*3/uL (ref 4.0–10.5)
nRBC: 0 % (ref 0.0–0.2)

## 2022-04-23 MED ORDER — POLYETHYLENE GLYCOL 3350 17 G PO PACK
17.0000 g | PACK | Freq: Every day | ORAL | Status: DC
  Administered 2022-04-23 – 2022-04-25 (×3): 17 g via ORAL
  Filled 2022-04-23 (×3): qty 1

## 2022-04-23 MED ORDER — SENNOSIDES-DOCUSATE SODIUM 8.6-50 MG PO TABS
1.0000 | ORAL_TABLET | Freq: Every day | ORAL | Status: DC
  Administered 2022-04-24 – 2022-04-27 (×4): 1 via ORAL
  Filled 2022-04-23 (×5): qty 1

## 2022-04-23 MED ORDER — DM-GUAIFENESIN ER 30-600 MG PO TB12
1.0000 | ORAL_TABLET | Freq: Two times a day (BID) | ORAL | Status: DC
  Administered 2022-04-23 – 2022-04-27 (×9): 1 via ORAL
  Filled 2022-04-23 (×9): qty 1

## 2022-04-23 NOTE — Progress Notes (Signed)
Progress Note   Patient: Wesley Anderson. XHB:[id number] DOB: [birth year] DOA: 04/22/2022     0 DOS: the patient was seen and examined on 04/23/2022   Brief hospital course: [name] is a 76 year old male with history of GERD, depression, combined heart failure, NYHA class I, history of tonsillectomy, Parkinson's disease status post brain stimulator placement who presented to the ED on 04/22/2022 for evaluation of worsening confusion, generalized weakness and fever.  At baseline, pt is ambulatory with rolling walker, also uses a wheelchair.    ED vitals -- temp 99.7 F, RR 24, HR 77, BP 119/69, spO2 96% on room air.  Labs were mostly unremarkable including WBC 6.5, Cr slightly above baseline 1.27, platelets 145k, normal lactic acid and procal < 0.10.  UA without signs of infection.  COVID PCR was tested positive.  Pt was admitted to the hospital. Started on Paxlovid and supportive care for Covid-19 illness.  PT evaluated patient. He is profoundly more weak than at baseline, SNF/rehab recommended.     Assessment and Plan: * COVID-19 virus infection Continue Paxlovid. No steroids indicated, not hypoxic. Vitamin C and zinc. Supportive care: bronchodilators PRN, Tussionex, Mucinex  Acute metabolic encephalopathy Presumed secondary to COVID-19 infection --Mgmt as outlined --Delirium precautions --Neuro checks --PT / OOB / mobilize as much as tolerated  AKI (acute kidney injury) ([place]) Given 500 mL bolus in ED, then continued on NS at 100 mL/h x 5 hours. Cr improved from 1.27 >> 1.10 today. -- Monitor off IV fluids - Encourage p.o. hydration - BMP in AM  Depression With hypersexuality - Continue sertraline 100 mg daily  Confusion See acute metabolic encephalopathy.  Fever and chills Due to Covid infection. Tylenol PRN fever  Weakness Generalized in setting of Covid infection. PT evaluation SNF recommended TOC consulted Fall precautions  S/P deep brain stimulator  placement - In 2015  Cognitive impairment --continue donepezil 10 mg nightly  Parkinson's disease ([place]) With Parkinson's dementia Continue home Sinemet Seroquel 75 mg nightly was decreased to 25 mg nightly at the recommendation of pharmacy in setting of Paxlovid initiation, due to drug interactions.        Subjective: Pt seen with wife at bedside. He continues to be far more weak than baseline, extremely fatigued.  He denies chest pain or shortness of breath.  Currently no other acute complaints.   Physical Exam: Vitals:   04/22/22 1604 04/22/22 1958 04/23/22 0405 04/23/22 0809  BP: 107/62 (!) 110/94 125/67 130/72  Pulse: (!) 55 (!) 53 (!) 51 64  Resp: '17 18 16 18  '$ Temp: 97.6 F (36.4 C) 98 F (36.7 C) 99 F (37.2 C) 98.4 F (36.9 C)  TempSrc:      SpO2: 97% 100% 96% 95%  Weight:      Height:       General exam: awake, alert, ill-appearing, no acute distress HEENT: moist mucus membranes, hearing grossly normal  Respiratory system: CTAB but diminished throughout, no wheezes, rales or rhonchi, normal respiratory effort. Cardiovascular system: normal S1/S2, RRR, no pedal edema.   Gastrointestinal system: soft, NT, ND Central nervous system: A&O x person and place. no gross focal neurologic deficits, normal speech Extremities: mild resting tremor mostly noted in the RUE, moves all, no edema, normal tone Skin: dry, intact, normal temperature Psychiatry: normal mood, flat affect   Data Reviewed:  Notable labs -- Cr improved to 1.10 from 1.27,  platelets 145 >> 143k, Hbg 12.8 from 13.5 after fluids likely dilution  Family Communication:  wife at bedside on rounds  Disposition: Status is: Observation The patient remains OBS appropriate and will d/c before 2 midnights.  Remains in the hospital due to persistent encephalopathy, profound weakness with SNF/rehab recommended.   Planned Discharge Destination: Skilled nursing facility    Time spent: 40  minutes  Author: Ezekiel Slocumb, DO 04/23/2022 3:08 PM  For on call review [email or web address].

## 2022-04-23 NOTE — Care Management Obs Status (Signed)
[facility] NOTIFICATION   Patient Details  Name: Wesley Anderson. MRN: [id number] Date of Birth: [birth year]   Medicare Observation Status Notification Given:  Yes    [name], RN 04/23/2022, 3:32 PM

## 2022-04-23 NOTE — TOC Initial Note (Signed)
Transition of Care (TOC) - Initial/Assessment Note    Patient Details  Name: Wesley Anderson. MRN: [id number] Date of Birth: 1946/02/14  Transition of [facility]) CM/SW Contact:    Wesley David, RN Phone Number: 04/23/2022, 3:35 PM  Clinical Narrative:                  Patient admitted to hospital 9/8, diagnosed with Covid.  He lives home with wife Wesley Anderson, spoke with both her and patient.  They are aware of recommendations for SNF for short term rehab and agreeable.  Wife state they have just purchased a home at [employer] at [place], prefer patient to be discharged to [place].  Will initiate SNF work up and bed search.    Expected Discharge Plan: Skilled Nursing Facility Barriers to Discharge: Continued Medical Work up   Patient Goals and CMS Choice Patient states their goals for this hospitalization and ongoing recovery are:: SNF CMS Medicare.gov Compare Post Acute Care list provided to:: Patient Choice offered to / list presented to : Patient  Expected Discharge Plan and Services Expected Discharge Plan: [place] Acute Care Choice: [place] Living arrangements for the past 2 months: Single Family Home                                      Prior Living Arrangements/Services Living arrangements for the past 2 months: Single Family Home Lives with:: Spouse Patient language and need for interpreter reviewed:: Yes        Need for Family Participation in Patient Care: Yes (Comment) Care giver support system in place?: Yes (comment) Current home services: DME Criminal Activity/Legal Involvement Pertinent to Current Situation/Hospitalization: No - Comment as needed  Activities of Daily Living Home Assistive Devices/Equipment: Walker (specify type) ADL Screening (condition at time of admission) Patient's cognitive ability adequate to safely complete daily activities?: Yes Is the patient deaf or have difficulty hearing?:  No Does the patient have difficulty seeing, even when wearing glasses/contacts?: No Does the patient have difficulty concentrating, remembering, or making decisions?: Yes Patient able to express need for assistance with ADLs?: Yes Does the patient have difficulty dressing or bathing?: Yes Independently performs ADLs?: No Does the patient have difficulty walking or climbing stairs?: Yes Weakness of Legs: Both Weakness of Arms/Hands: Both  Permission Sought/Granted Permission sought to share information with : Case Manager Permission granted to share information with : Yes, Verbal Permission Granted              Emotional Assessment   Attitude/Demeanor/Rapport: Engaged Affect (typically observed): Calm Orientation: : Oriented to Self, Oriented to Place, Oriented to  Time, Oriented to Situation   Psych Involvement: No (comment)  Admission diagnosis:  Dehydration [E86.0] Delirium [R41.0] AKI (acute kidney injury) ([place]) [N17.9] COVID-19 [U07.1] Patient Active Problem List   Diagnosis Date Noted   COVID-19 virus infection 04/22/2022   Depression 04/22/2022   Acute gastroenteritis 08/08/2021   Sepsis ([place]) 08/08/2021   Confusion    Anemia 07/09/2021   Weakness 07/09/2021   Fever and chills 07/09/2021   Myalgia 07/09/2021   Influenza A 40/98/1191   Acute metabolic encephalopathy 47/82/9562   AKI (acute kidney injury) ([place]) 07/09/2020   Leukocytosis 07/09/2020   S/P deep brain stimulator placement 07/09/2020   Squamous cell carcinoma, 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 04/24/2020   Heme positive stool 05/27/2016  Alcohol dependence in remission ([place]) 07/31/2013   Cognitive impairment 02/22/2013   History of malignant melanoma of skin 05/15/2012   Parkinson's disease ([place]) 09/28/2011   PCP:  Wesley Hector, MD Pharmacy:   [id number] [email or web address]: [phone]:  [phone]    Social Determinants of Health (SDOH) Interventions    Readmission Risk Interventions    08/09/2021   10:49 AM  Readmission Risk Prevention Plan  Transportation Screening Complete  PCP or Specialist Appt within 3-5 Days Complete  HRI or H[place]Complete  Social Work Consult for [place]anning/Counseling Complete  Palliative Care Screening Not Applicable  Medication Review (Press photographer Complete

## 2022-04-23 NOTE — Plan of Care (Signed)

## 2022-04-23 NOTE — Progress Notes (Signed)
Patient's PIV accidentally came out when transferring back to bed. Patient did not want RN to attempt another PIV at this time. Per Dr Arbutus Ped, okay to leave PIV out for now.

## 2022-04-23 NOTE — Assessment & Plan Note (Signed)
See acute metabolic encephalopathy 

## 2022-04-23 NOTE — Evaluation (Signed)
Physical Therapy Evaluation Patient Details Name: Wesley Anderson. MRN: [id number] DOB: 1946-05-21 Today's Date: 04/23/2022  History of Present Illness  Pt is a 76 y/o M admitted on 04/22/22 after presenting to the ED with c/o AMS & fever. Pt tested positive for Covid 19. PMH: GERD, depression, combined heart failure, NYHA class 1, Parkinson's disease s/p brain stimulator  Clinical Impression  Pt seen for PT evaluation with pt agreeable to tx. Pt reports prior to admission he was ambulatory with RW without assistance, living with his wife with steps to enter the home through the garage. On this date, pt is able to complete supine>sit with HOB elevated, bed rails & extra time & min assist to upright trunk. Pt demonstrates L lateral lean in sitting & standing with decreased awareness requiring cuing & assist to come to midline. Pt completes stand pivot bed>recliner without AD & min assist & progresses to gait with RW & min assist. During short distance gait in room pt demonstrates ongoing L lateral lean, pushing RW out in front & impaired gait pattern. Pt is currently a very high fall risk. Will continue to follow pt acutely to address balance, strengthening, and gait with LRAD. Pt would benefit from STR upon d/c to maximize independence with functional mobility, decrease caregiver burden, & reduce fall risk prior to return home.      Recommendations for follow up therapy are one component of a multi-disciplinary discharge planning process, led by the attending physician.  Recommendations may be updated based on patient status, additional functional criteria and insurance authorization.  Follow Up Recommendations Skilled nursing-short term rehab (<3 hours/day) Can patient physically be transported by private vehicle: No    Assistance Recommended at Discharge Frequent or constant Supervision/Assistance  Patient can return home with the following  A lot of help with bathing/dressing/bathroom;A lot of  help with walking and/or transfers;Assist for transportation;Assistance with cooking/housework;Help with stairs or ramp for entrance    Equipment Recommendations None recommended by PT  Recommendations for Other Services  OT consult    Functional Status Assessment Patient has had a recent decline in their functional status and demonstrates the ability to make significant improvements in function in a reasonable and predictable amount of time.     Precautions / Restrictions Precautions Precautions: Fall Restrictions Weight Bearing Restrictions: No      Mobility  Bed Mobility Overal bed mobility: Needs Assistance Bed Mobility: Supine to Sit     Supine to sit: Min assist, HOB elevated     General bed mobility comments: use of bed rails, extra time, cuing to use bed rails, assistance to upright posture    Transfers Overall transfer level: Needs assistance Equipment used: None Transfers: Sit to/from Stand, Bed to chair/wheelchair/BSC Sit to Stand: Min assist   Step pivot transfers: Min assist            Ambulation/Gait Ambulation/Gait assistance: Min assist Gait Distance (Feet): 10 Feet Assistive device: Rolling walker (2 wheels) Gait Pattern/deviations: Decreased dorsiflexion - right, Decreased dorsiflexion - left, Decreased step length - left, Decreased step length - right, Decreased stride length Gait velocity: decreased     General Gait Details: L lateral lean, impaired step width LLE (close to midline), decreased weight shift to R, pushes RW out in front of her  Stairs            Wheelchair Mobility    Modified Rankin (Stroke Patients Only)       Balance Overall balance assessment: Needs assistance Sitting-balance  support: Feet supported, Bilateral upper extremity supported Sitting balance-Leahy Scale: Poor   Postural control: Left lateral lean Standing balance support: Bilateral upper extremity supported, During functional activity Standing  balance-Leahy Scale: Poor                               Pertinent Vitals/Pain Pain Assessment Pain Assessment: Faces Faces Pain Scale: No hurt    Home Living Family/patient expects to be discharged to:: Private residence Living Arrangements: Spouse/significant other Available Help at Discharge: Family;Available 24 hours/day Type of Home: House Home Access: Stairs to enter Entrance Stairs-Rails: Right;Left;Can reach both Entrance Stairs-Number of Steps: 4 at garage   Home Layout: One level Home Equipment: Conservation officer, nature (2 wheels);Wheelchair - manual;Grab bars - tub/shower;BSC/3in1;Shower seat - built in      Prior Function Prior Level of Function : Needs assist             Mobility Comments: Pt reports he's ambulatory with RW       Hand Dominance        Extremity/Trunk Assessment   Upper Extremity Assessment Upper Extremity Assessment: Generalized weakness    Lower Extremity Assessment Lower Extremity Assessment: Generalized weakness       Communication   Communication: No difficulties  Cognition Arousal/Alertness: Awake/alert Behavior During Therapy: WFL for tasks assessed/performed Overall Cognitive Status: Within Functional Limits for tasks assessed                                 General Comments: AxOx3, unsure of situation/diagnosis        General Comments General comments (skin integrity, edema, etc.): Pt found incontinent of urine in bed & also with urinary incontinence while ambulating (reported he needed to void but then did so before PT was able to obtain urinal).    Exercises     Assessment/Plan    PT Assessment Patient needs continued PT services  PT Problem List Decreased strength;Decreased coordination;Decreased activity tolerance;Decreased balance;Decreased mobility;Decreased safety awareness;Decreased knowledge of use of DME;Decreased cognition       PT Treatment Interventions DME instruction;Therapeutic  exercise;Gait training;Balance training;Stair training;Neuromuscular re-education;Functional mobility training;Patient/family education;Modalities;Manual techniques;Therapeutic activities    PT Goals (Current goals can be found in the Care Plan section)  Acute Rehab PT Goals Patient Stated Goal: return to PLOF PT Goal Formulation: With patient Time For Goal Achievement: 05/07/22 Potential to Achieve Goals: Good    Frequency Min 2X/week     Co-evaluation               AM-PAC PT "6 Clicks" Mobility  Outcome Measure Help needed turning from your back to your side while in a flat bed without using bedrails?: A Little Help needed moving from lying on your back to sitting on the side of a flat bed without using bedrails?: A Little Help needed moving to and from a bed to a chair (including a wheelchair)?: A Little Help needed standing up from a chair using your arms (e.g., wheelchair or bedside chair)?: A Little Help needed to walk in hospital room?: A Lot Help needed climbing 3-5 steps with a railing? : Total 6 Click Score: 15    End of Session   Activity Tolerance: Patient tolerated treatment well Patient left: in chair;with chair alarm set;with call bell/phone within reach Nurse Communication: Mobility status PT Visit Diagnosis: Unsteadiness on feet (R26.81);Muscle weakness (generalized) (M62.81);Difficulty in walking,  not elsewhere classified (R26.2);Other abnormalities of gait and mobility (R26.89)    Time: 8682-5749 PT Time Calculation (min) (ACUTE ONLY): 18 min   Charges:   PT Evaluation $PT Eval Moderate Complexity: 1 Mod          Lavone Nian, PT, DPT 04/23/22, 2:39 PM   Waunita Schooner 04/23/2022, 2:37 PM

## 2022-04-23 NOTE — Assessment & Plan Note (Signed)
Due to Covid infection. Tylenol PRN fever

## 2022-04-23 NOTE — Assessment & Plan Note (Signed)
Generalized in setting of Covid infection. PT evaluation SNF recommended TOC consulted Fall precautions

## 2022-04-24 DIAGNOSIS — U071 COVID-19: Secondary | ICD-10-CM | POA: Diagnosis not present

## 2022-04-24 LAB — COMPREHENSIVE METABOLIC PANEL
ALT: 5 U/L (ref 0–44)
AST: 23 U/L (ref 15–41)
Albumin: 3.5 g/dL (ref 3.5–5.0)
Alkaline Phosphatase: 66 U/L (ref 38–126)
Anion gap: 7 (ref 5–15)
BUN: 15 mg/dL (ref 8–23)
CO2: 25 mmol/L (ref 22–32)
Calcium: 9 mg/dL (ref 8.9–10.3)
Chloride: 109 mmol/L (ref 98–111)
Creatinine, Ser: 1.16 mg/dL (ref 0.61–1.24)
GFR, Estimated: >60 mL/min (ref >60)
Glucose, Bld: 99 mg/dL (ref 70–99)
Potassium: 3.8 mmol/L (ref 3.5–5.1)
Sodium: 141 mmol/L (ref 135–145)
Total Bilirubin: 1.3 mg/dL — ABNORMAL HIGH (ref 0.3–1.2)
Total Protein: 6.9 g/dL (ref 6.5–8.1)

## 2022-04-24 LAB — C-REACTIVE PROTEIN: CRP: 6.4 mg/dL — ABNORMAL HIGH (ref <1.0)

## 2022-04-24 LAB — CBC
HCT: 41.5 % (ref 39.0–52.0)
Hemoglobin: 14 g/dL (ref 13.0–17.0)
MCH: 30.5 pg (ref 26.0–34.0)
MCHC: 33.7 g/dL (ref 30.0–36.0)
MCV: 90.4 fL (ref 80.0–100.0)
Platelets: 148 10*3/uL — ABNORMAL LOW (ref 150–400)
RBC: 4.59 MIL/uL (ref 4.22–5.81)
RDW: 13.3 % (ref 11.5–15.5)
WBC: 7.4 10*3/uL (ref 4.0–10.5)
nRBC: 0 % (ref 0.0–0.2)

## 2022-04-24 NOTE — Progress Notes (Signed)
Progress Note   Patient: Wesley Anderson. TMA:[id number] DOB: May 02, 1946 DOA: 04/22/2022     0 DOS: the patient was seen and examined on 04/24/2022   Brief hospital course: [name] is a 76 year old male with history of GERD, depression, combined heart failure, NYHA class I, history of tonsillectomy, Parkinson's disease status post brain stimulator placement who presented to the ED on 04/22/2022 for evaluation of worsening confusion, generalized weakness and fever.  At baseline, pt is ambulatory with rolling walker, also uses a wheelchair.    ED vitals -- temp 99.7 F, RR 24, HR 77, BP 119/69, spO2 96% on room air.  Labs were mostly unremarkable including WBC 6.5, Cr slightly above baseline 1.27, platelets 145k, normal lactic acid and procal < 0.10.  UA without signs of infection.  COVID PCR was tested positive.  Pt was admitted to the hospital. Started on Paxlovid and supportive care for Covid-19 illness.  PT evaluated patient. He is profoundly more weak than at baseline, SNF/rehab recommended.     Assessment and Plan: * COVID-19 virus infection Continue Paxlovid. No steroids indicated, not hypoxic. Vitamin C and zinc. Supportive care: bronchodilators PRN, Tussionex, Mucinex  Acute metabolic encephalopathy Presumed secondary to COVID-19 infection --Mgmt as outlined --Delirium precautions --Neuro checks --PT / OOB / mobilize as much as tolerated  AKI (acute kidney injury) ([place]) Given 500 mL bolus in ED, then continued on NS at 100 mL/h x 5 hours. Cr improved from 1.27 >> 1.10 today. -- Monitor off IV fluids - Encourage p.o. hydration - BMP in AM  Depression With hypersexuality - Continue sertraline 100 mg daily  Confusion See acute metabolic encephalopathy.  Fever and chills Due to Covid infection. Tylenol PRN fever  Weakness Generalized in setting of Covid infection. PT evaluation SNF recommended TOC consulted Fall precautions  S/P deep brain  stimulator placement - In 2015  Cognitive impairment --continue donepezil 10 mg nightly  Parkinson's disease ([place]) With Parkinson's dementia Continue home Sinemet Seroquel 75 mg nightly was decreased to 25 mg nightly at the recommendation of pharmacy in setting of Paxlovid initiation, due to drug interactions.        Subjective: Pt seen awake resting in bed this afternoon.  Wife is at home and now sick with COVID also.  Patient reports he is overall feeling better but remains far weaker than baseline.  He is reluctantly agreeable to rehab but hopes to get stronger in the interim isolation period.     Physical Exam: Vitals:   04/23/22 1548 04/23/22 2019 04/24/22 0435 04/24/22 0930  BP: (!) 141/100 (!) 146/88 137/84 (!) 147/75  Pulse: 67 60 64 (!) 51  Resp: '18 16 18 17  '$ Temp: 98.1 F (36.7 C) 100.1 F (37.8 C) 98.7 F (37.1 C) 98.7 F (37.1 C)  TempSrc:      SpO2: 97% 95% 95% 94%  Weight:      Height:       General exam: awake, alert, ill-appearing, no acute distress HEENT: moist mucus membranes, hearing grossly normal  Respiratory system: CTAB but diminished throughout, no wheezes, rales or rhonchi, normal respiratory effort. Cardiovascular system: normal S1/S2, RRR, no pedal edema.   Gastrointestinal system: soft, NT, ND Central nervous system: A&O x person and place. no gross focal neurologic deficits, normal speech Extremities: mild resting tremor mostly noted in the RUE, moves all, no edema, normal tone Skin: dry, intact, normal temperature Psychiatry: normal mood, flat affect   Data Reviewed:  Notable labs -- Cr 1.16,  platelets 145 >> 143 >> 148k, Hbg normalized 14.  CMP normal except Tbili 1.3  Family Communication: wife at bedside on rounds  Disposition: Status is: Observation The patient remains OBS appropriate and will d/c before 2 midnights.  Remains in the hospital due to profound weakness with SNF/rehab recommended. Placement pending.   Planned  Discharge Destination: Skilled nursing facility    Time spent: 40 minutes  Author: Ezekiel Slocumb, DO 04/24/2022 3:59 PM  For on call review [email or web address].

## 2022-04-24 NOTE — Evaluation (Signed)
Occupational Therapy Evaluation Patient Details Name: Wesley Anderson. MRN: [id number] DOB: 1946/06/27 Today's Date: 04/24/2022   History of Present Illness Pt is a 76 y/o M admitted on 04/22/22 after presenting to the ED with c/o AMS & fever. Pt tested positive for Covid 19. PMH: GERD, depression, combined heart failure, NYHA class 1, Parkinson's disease s/p brain stimulator   Clinical Impression   Chart reviewed, Rn cleared pt for participation in OT evaluation. Pt is alert, oriented x4 however is impulsive and presents with deficits in safety awareness and awareness of current level of deficits. Increased time required for processing all mobility/ADL tasks on this date with verbal and tactile cueing provided for sequencing and safety throughout. Pt endorses he is typically MOD I with ADL PTA, amb with RW. On this date pt presents with deficits in strength, endurance, activity tolerance, balance with significant posterior, then L<>R lateral lean, cognition affecting safe and optimal ADL completion. MIN-MOD A required for bed mobility, STS with MIN A, short amb transfer to bedside commode with MIN A. Pt completes peri care with MIN A for thoroughness. Significant tremor noted throughout, pt endorses he recently got meds however RN reports pt is ready for additional meds. Pt is performing below PLOF , would benefit from STR to address functional deficits and to facilitate return to PLOF. Pt is left as received, NAD, all needs met. OT will follow acutely.      Recommendations for follow up therapy are one component of a multi-disciplinary discharge planning process, led by the attending physician.  Recommendations may be updated based on patient status, additional functional criteria and insurance authorization.   Follow Up Recommendations  Skilled nursing-short term rehab (<3 hours/day)    Assistance Recommended at Discharge Frequent or constant Supervision/Assistance  Patient can return home  with the following A lot of help with walking and/or transfers;A lot of help with bathing/dressing/bathroom    Functional Status Assessment  Patient has had a recent decline in their functional status and demonstrates the ability to make significant improvements in function in a reasonable and predictable amount of time.  Equipment Recommendations  Other (comment) (per next venue of care)    Recommendations for Other Services       Precautions / Restrictions Precautions Precautions: Fall Precaution Comments: heavy posterior, lateral lean Restrictions Weight Bearing Restrictions: No      Mobility Bed Mobility Overal bed mobility: Needs Assistance Bed Mobility: Supine to Sit, Sit to Supine     Supine to sit: Mod assist, HOB elevated Sit to supine: Min assist, HOB elevated        Transfers Overall transfer level: Needs assistance Equipment used: Rolling walker (2 wheels) Transfers: Sit to/from Stand Sit to Stand: Min assist     Step pivot transfers: Min assist, Mod assist            Balance Overall balance assessment: Needs assistance Sitting-balance support: Feet supported, Bilateral upper extremity supported Sitting balance-Leahy Scale: Fair     Standing balance support: Bilateral upper extremity supported, During functional activity Standing balance-Leahy Scale: Poor Standing balance comment: heavy lateral (in R and L) , then posterior lean requiring physical assist for upright standing                           ADL either performed or assessed with clinical judgement   ADL Overall ADL's : Needs assistance/impaired  Upper Body Dressing : Minimal assistance;Sitting   Lower Body Dressing: Maximal assistance Lower Body Dressing Details (indicate cue type and reason): socks Toilet Transfer: Minimal assistance;Moderate assistance;Cueing for safety;Cueing for sequencing;BSC/3in1 Toilet Transfer Details (indicate cue type and  reason): MOD A for short amb transfer with RW to beside commode with step by step vcs for technique and safety, MIN A for transfer back to bed from bedside commode with intermittent vcs for safety; heavy posterior lean noted Toileting- Clothing Manipulation and Hygiene: Minimal assistance;Sit to/from stand Toileting - Clothing Manipulation Details (indicate cue type and reason): peri care             Vision Patient Visual Report: No change from baseline       Perception     Praxis      Pertinent Vitals/Pain Pain Assessment Pain Assessment: No/denies pain     Hand Dominance     Extremity/Trunk Assessment Upper Extremity Assessment Upper Extremity Assessment: Generalized weakness (pt with BUE tremors throughout affecting ADLs, package/container management)   Lower Extremity Assessment Lower Extremity Assessment: Generalized weakness       Communication Communication Communication: Expressive difficulties (garbled speech at times)   Cognition Arousal/Alertness: Awake/alert Behavior During Therapy: WFL for tasks assessed/performed, Impulsive Overall Cognitive Status: No family/caregiver present to determine baseline cognitive functioning Area of Impairment: Following commands, Safety/judgement, Awareness, Problem solving                       Following Commands: Follows one step commands with increased time Safety/Judgement: Decreased awareness of safety, Decreased awareness of deficits Awareness: Emergent Problem Solving: Slow processing, Requires verbal cues, Difficulty sequencing, Requires tactile cues       General Comments       Exercises Other Exercises Other Exercises: edu pt re: role of OT, role of rehab, discharge recommendations, home safety, DME use, falls prevention   Shoulder Instructions      Home Living Family/patient expects to be discharged to:: Private residence Living Arrangements: Spouse/significant other Available Help at  Discharge: Family;Available 24 hours/day Type of Home: House Home Access: Stairs to enter [employer] of Steps: 4 at garage Entrance Stairs-Rails: Right;Left;Can reach both Home Layout: One level     Bathroom Shower/Tub: Occupational psychologist: Standard     Home Equipment: Conservation officer, nature (2 wheels);Wheelchair - manual;Grab bars - tub/shower;BSC/3in1;Shower seat - built in   Additional Comments: per chart pt and wife just purchased at [employer] at [place]      Prior Functioning/Environment Prior Level of Function : Needs assist             Mobility Comments: pt reports amb with RW over the last month, prior to that was not amb with AD ADLs Comments: Pt reports wife will assist with ADLs as needed, does not leave him alone, but generally MOD I with ADL; assist for IADLs        OT Problem List: Decreased strength;Decreased activity tolerance;Impaired balance (sitting and/or standing);Decreased coordination;Decreased knowledge of use of DME or AE      OT Treatment/Interventions: Self-care/ADL training;Patient/family education;Therapeutic exercise;Balance training;Energy conservation;Therapeutic activities;DME and/or AE instruction    OT Goals(Current goals can be found in the care plan section) Acute Rehab OT Goals Patient Stated Goal: go home OT Goal Formulation: With patient Time For Goal Achievement: 05/08/22 Potential to Achieve Goals: Fair ADL Goals Pt Will Perform Grooming: with supervision;sitting Pt Will Perform Lower Body Dressing: with supervision;sitting/lateral leans Pt Will Transfer to Toilet:  with supervision Pt Will Perform Toileting - Clothing Manipulation and hygiene: with supervision;sitting/lateral leans;sit to/from stand  OT Frequency: Min 2X/week    Co-evaluation              AM-PAC OT "6 Clicks" Daily Activity     Outcome Measure Help from another person eating meals?: A Little Help from another person taking care  of personal grooming?: A Little Help from another person toileting, which includes using toliet, bedpan, or urinal?: A Little Help from another person bathing (including washing, rinsing, drying)?: A Lot Help from another person to put on and taking off regular upper body clothing?: A Little Help from another person to put on and taking off regular lower body clothing?: A Lot 6 Click Score: 16   End of Session Equipment Utilized During Treatment: Gait belt;Rolling walker (2 wheels) Nurse Communication: Mobility status  Activity Tolerance: Patient tolerated treatment well Patient left: in bed;with call bell/phone within reach;with bed alarm set  OT Visit Diagnosis: Unsteadiness on feet (R26.81);Other abnormalities of gait and mobility (R26.89)                Time: 4883-0141 OT Time Calculation (min): 21 min Charges:  OT Evaluation $OT Eval Moderate Complexity: 1 Mod Shanon Payor, OTD OTR/L  04/24/22, 3:28 PM

## 2022-04-24 NOTE — TOC Progression Note (Signed)
Transition of Care (TOC) - Progression Note    Patient Details  Name: Wesley Anderson. MRN: [id number] Date of Birth: 1946-02-24  Transition of Care [facility]) CM/SW Contact  [name], RN Phone Number: 04/24/2022, 10:06 AM  Clinical Narrative:     Phoebe Perch completed, bed search initiated, preferred facility, [facility].  Will keep patient and wife updated.  Wife report contact person at [facility] at [place] is Roscoe [place] [phone]) if there is a need to contact anyone regarding SNF placement.   Expected Discharge Plan: [place] Barriers to Discharge: Continued Medical Work up  Expected Discharge Plan and Services Expected Discharge Plan: [place] Choice: [place] arrangements for the past 2 months: Single Family Home                                       Social Determinants of Health (SDOH) Interventions    Readmission Risk Interventions    08/09/2021   10:49 AM  Readmission Risk Prevention Plan  Transportation Screening Complete  PCP or Specialist Appt within 3-5 Days Complete  HRI or [place] Complete  Social Work Consult for [place] Planning/Counseling Complete  Palliative Care Screening Not Applicable  Medication Review Press photographer) Complete

## 2022-04-24 NOTE — NC FL2 (Signed)
[place] LEVEL OF CARE SCREENING TOOL     IDENTIFICATION  Patient Name: Wesley Anderson. Birthdate: 1946-03-26 Sex: male Admission Date (Current Location): 04/22/2022  [facility] and [place] Number:  Engineering geologist and Address:  [facility], [address] [place], [place] [address]      Provider Number: [id number]  Attending Physician Name and Address:  Ezekiel Slocumb, DO  Relative Name and Phone Number:  Ghazi Rumpf [phone]    Current Level of Care: Hospital Recommended Level of Care: [place] Prior Approval Number:    Date Approved/Denied:   PASRR Number: [id number]  Discharge Plan: SNF    Current Diagnoses: Patient Active Problem List   Diagnosis Date Noted   COVID-19 virus infection 04/22/2022   Depression 04/22/2022   Acute gastroenteritis 08/08/2021   Sepsis ([place]) 08/08/2021   Confusion    Anemia 07/09/2021   Weakness 07/09/2021   Fever and chills 07/09/2021   Myalgia 07/09/2021   Influenza A 16/60/6301   Acute metabolic encephalopathy 60/05/9322   AKI (acute kidney injury) ([place]) 07/09/2020   Leukocytosis 07/09/2020   S/P deep brain stimulator placement 07/09/2020   Squamous cell carcinoma, 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 04/24/2020   Heme positive stool 05/27/2016   Alcohol dependence in remission ([place]) 07/31/2013   Cognitive impairment 02/22/2013   History of malignant melanoma of skin 05/15/2012   Parkinson's disease ([place]) 09/28/2011    Orientation RESPIRATION BLADDER Height & Weight     Self, Situation, Place    Incontinent Weight: 80.1 kg (per [facility] note 04/21/22) Height:  '5\' 7"'$  (170.2 cm) (per [facility] note 04/21/22)  BEHAVIORAL SYMPTOMS/MOOD NEUROLOGICAL BOWEL NUTRITION STATUS      Continent Diet  AMBULATORY STATUS COMMUNICATION OF NEEDS Skin   Limited Assist Verbally Bruising                       Personal Care Assistance Level of Assistance  Bathing, Dressing  Bathing Assistance: Limited assistance   Dressing Assistance: Limited assistance     Functional Limitations Info             SPECIAL CARE FACTORS FREQUENCY  PT (By licensed PT), OT (By licensed OT)     PT Frequency: 5 times a week OT Frequency: 5 times a week            Contractures Contractures Info: Not present    Additional Factors Info  Code Status, Allergies Code Status Info: Full Allergies Info: Artane (trihexyphenidyl) High  Anxiety   Propofol Not Specified  Other (See Comments) Delirium Delirium Delirium           Current Medications (04/24/2022):  This is the current hospital active medication list Current Facility-Administered Medications  Medication Dose Route Frequency Provider Last Rate Last Admin   acetaminophen (TYLENOL) tablet 650 mg  650 mg Oral Q6H PRN Cox, Amy N, DO       Or   acetaminophen (TYLENOL) suppository 650 mg  650 mg Rectal Q6H PRN Cox, Amy N, DO       albuterol (VENTOLIN HFA) 108 (90 Base) MCG/ACT inhaler 2 puff  2 puff Inhalation Q6H PRN Cox, Amy N, DO       ascorbic acid (VITAMIN C) tablet 500 mg  500 mg Oral Daily Cox, Amy N, DO       carbidopa-levodopa (SINEMET IR) 25-100 MG per tablet immediate release 2 tablet  2 tablet Oral QID Cox, Amy N, DO  2 tablet at 04/23/22 2222   chlorpheniramine-HYDROcodone (TUSSIONEX) 10-8 MG/5ML suspension 5 mL  5 mL Oral QHS PRN Cox, Amy N, DO       dextromethorphan-guaiFENesin (MUCINEX DM) 30-600 MG per 12 hr tablet 1 tablet  1 tablet Oral BID Nicole Kindred A, DO   1 tablet at 04/23/22 2221   donepezil (ARICEPT) tablet 10 mg  10 mg Oral QHS Cox, Amy N, DO   10 mg at 04/23/22 2222   enoxaparin (LOVENOX) injection 40 mg  40 mg Subcutaneous QHS Cox, Amy N, DO   40 mg at 04/23/22 2221   melatonin tablet 10 mg  10 mg Oral QHS Cox, Amy N, DO   10 mg at 04/23/22 2222   multivitamin with minerals tablet 1 tablet  1 tablet Oral Daily Cox, Amy N, DO   1 tablet at 04/23/22 9191   nirmatrelvir/ritonavir EUA  (renal dosing) (PAXLOVID) 1 tablet  1 tablet Oral BID Cox, Amy N, DO   1 tablet at 04/23/22 2223   ondansetron (ZOFRAN) tablet 4 mg  4 mg Oral Q6H PRN Cox, Amy N, DO       Or   ondansetron (ZOFRAN) injection 4 mg  4 mg Intravenous Q6H PRN Cox, Amy N, DO       pantoprazole (PROTONIX) EC tablet 40 mg  40 mg Oral Daily Cox, Amy N, DO   40 mg at 04/23/22 0856   polyethylene glycol (MIRALAX / GLYCOLAX) packet 17 g  17 g Oral Daily Nicole Kindred A, DO   17 g at 04/23/22 1324   QUEtiapine (SEROQUEL) tablet 25 mg  25 mg Oral QHS Cox, Amy N, DO   25 mg at 04/23/22 2222   senna-docusate (Senokot-S) tablet 1 tablet  1 tablet Oral Daily Nicole Kindred A, DO       sertraline (ZOLOFT) tablet 100 mg  100 mg Oral Daily Cox, Amy N, DO   100 mg at 04/23/22 0855   tamsulosin (FLOMAX) capsule 0.4 mg  0.4 mg Oral Daily Cox, Amy N, DO   0.4 mg at 04/23/22 0856   zinc sulfate capsule 220 mg  220 mg Oral Daily Cox, Amy N, DO         Discharge Medications: Please see discharge summary for a list of discharge medications.  Relevant Imaging Results:  Relevant Lab Results:   Additional Information SS # [id number]  [name], RN

## 2022-04-25 DIAGNOSIS — U071 COVID-19: Secondary | ICD-10-CM | POA: Diagnosis not present

## 2022-04-25 LAB — CBC
HCT: 42.9 % (ref 39.0–52.0)
Hemoglobin: 14.3 g/dL (ref 13.0–17.0)
MCH: 30.7 pg (ref 26.0–34.0)
MCHC: 33.3 g/dL (ref 30.0–36.0)
MCV: 92.1 fL (ref 80.0–100.0)
Platelets: 155 10*3/uL (ref 150–400)
RBC: 4.66 MIL/uL (ref 4.22–5.81)
RDW: 13.5 % (ref 11.5–15.5)
WBC: 7.5 10*3/uL (ref 4.0–10.5)
nRBC: 0 % (ref 0.0–0.2)

## 2022-04-25 LAB — C-REACTIVE PROTEIN: CRP: 6.3 mg/dL — ABNORMAL HIGH (ref <1.0)

## 2022-04-25 MED ORDER — POLYETHYLENE GLYCOL 3350 17 G PO PACK: 17.0000 g | PACK | Freq: Every day | ORAL | Status: DC | PRN

## 2022-04-25 NOTE — TOC Progression Note (Signed)
Transition of Care (TOC) - Progression Note    Patient Details  Name: Wesley Anderson. MRN: [id number] Date of Birth: [birth year]  Transition of Care [facility]) CM/SW [place], [place] Phone Number: 04/25/2022, 2:54 PM  Clinical Narrative:     CSW spoke with Joelene Millin at [facility] who reports she is following up on referral to see if they can accept patient. Pending acceptance at this time.    Expected Discharge Plan: [place] Barriers to Discharge: Continued Medical Work up  Expected Discharge Plan and Services Expected Discharge Plan: [place] Choice: [place] arrangements for the past 2 months: Single Family Home                                       Social Determinants of Health (SDOH) Interventions    Readmission Risk Interventions    08/09/2021   10:49 AM  Readmission Risk Prevention Plan  Transportation Screening Complete  PCP or Specialist Appt within 3-5 Days Complete  HRI or [place] Complete  Social Work Consult for [place] Planning/Counseling Complete  Palliative Care Screening Not Applicable  Medication Review Press photographer) Complete

## 2022-04-25 NOTE — Progress Notes (Signed)
Progress Note   Patient: Wesley Anderson. ASN:[id number] DOB: 1946-03-07 DOA: 04/22/2022     0 DOS: the patient was seen and examined on 04/25/2022   Brief hospital course: Wesley Anderson is a 76 year old male with history of GERD, depression, combined heart failure, NYHA class I, history of tonsillectomy, Parkinson's disease status post brain stimulator placement who presented to the ED on 04/22/2022 for evaluation of worsening confusion, generalized weakness and fever.  At baseline, pt is ambulatory with rolling walker, also uses a wheelchair.    ED vitals -- temp 99.7 F, RR 24, HR 77, BP 119/69, spO2 96% on room air.  Labs were mostly unremarkable including WBC 6.5, Cr slightly above baseline 1.27, platelets 145k, normal lactic acid and procal < 0.10.  UA without signs of infection.  COVID PCR was tested positive.  Pt was admitted to the hospital. Started on Paxlovid and supportive care for Covid-19 illness.  PT evaluated patient. He is profoundly more weak than at baseline, SNF/rehab recommended.     Assessment and Plan: * COVID-19 virus infection Continue Paxlovid. No steroids indicated, not hypoxic. Vitamin C and zinc. Supportive care: bronchodilators PRN, Tussionex, Mucinex  Acute metabolic encephalopathy Presumed secondary to COVID-19 infection --Mgmt as outlined --Delirium precautions --Neuro checks --PT / OOB / mobilize as much as tolerated  AKI (acute kidney injury) ([place]) Given 500 mL bolus in ED, then continued on NS at 100 mL/h x 5 hours. Cr improved from 1.27 >> 1.10 today. -- Monitor off IV fluids - Encourage p.o. hydration - BMP in AM  Depression With hypersexuality - Continue sertraline 100 mg daily  Confusion See acute metabolic encephalopathy.  Fever and chills Due to Covid infection. Tylenol PRN fever  Weakness Generalized in setting of Covid infection. PT evaluation SNF recommended TOC consulted Fall precautions  S/P deep brain  stimulator placement - In 2015  Cognitive impairment --continue donepezil 10 mg nightly  Parkinson's disease ([place]) With Parkinson's dementia Continue home Sinemet Seroquel 75 mg nightly was decreased to 25 mg nightly at the recommendation of pharmacy in setting of Paxlovid initiation, due to drug interactions.        Subjective: Pt seen awake resting in bed today. Reports feeling better, wants to get up out of bed and try walking today.  Hopes to go home and ultimately not need rehab, wants to work with PT.     Physical Exam: Vitals:   04/24/22 2010 04/25/22 0431 04/25/22 0812 04/25/22 1543  BP: 100/70 116/73 138/75 107/86  Pulse: 66 63 (!) 50 (!) 58  Resp: '18 18 16 18  '$ Temp: 97.9 F (36.6 C) 98.3 F (36.8 C) 98.2 F (36.8 C) 98.1 F (36.7 C)  TempSrc:  Oral    SpO2: 94% 95% 96% 95%  Weight:      Height:       General exam: awake, alert, mildly ill-appearing, no acute distress HEENT: moist mucus membranes, hearing grossly normal  Respiratory system: on room air, normal respiratory effort. Cardiovascular system: RRR, no pedal edema.   Central nervous system: A&O x person and place. normal speech, mild resting upper extremity tremor R>L Skin: dry, intact, normal temperature Psychiatry: normal mood, flat affect   Data Reviewed:  CRP 6.4 > 6.3 today, no other new labs  Family Communication: son updated by phone this evening  Disposition: Status is: Observation The patient remains OBS appropriate and will d/c before 2 midnights.  Remains in the hospital due to profound weakness with SNF/rehab recommended. Placement  pending.   Planned Discharge Destination: Skilled nursing facility    Time spent: 40 minutes  Author: Ezekiel Slocumb, DO 04/25/2022 5:31 PM  For on call review [email or web address].

## 2022-04-26 ENCOUNTER — Ambulatory Visit: Payer: Medicare HMO

## 2022-04-26 DIAGNOSIS — U071 COVID-19: Secondary | ICD-10-CM | POA: Diagnosis not present

## 2022-04-26 LAB — BASIC METABOLIC PANEL
Anion gap: 8 (ref 5–15)
BUN: 23 mg/dL (ref 8–23)
CO2: 26 mmol/L (ref 22–32)
Calcium: 9.7 mg/dL (ref 8.9–10.3)
Chloride: 110 mmol/L (ref 98–111)
Creatinine, Ser: 1.22 mg/dL (ref 0.61–1.24)
GFR, Estimated: >60 mL/min (ref >60)
Glucose, Bld: 96 mg/dL (ref 70–99)
Potassium: 3.9 mmol/L (ref 3.5–5.1)
Sodium: 144 mmol/L (ref 135–145)

## 2022-04-26 MED ORDER — NIRMATRELVIR/RITONAVIR (PAXLOVID) TABLET (RENAL DOSING)
2.0000 | ORAL_TABLET | Freq: Two times a day (BID) | ORAL | Status: AC
  Administered 2022-04-26 (×2): 2 via ORAL
  Filled 2022-04-26: qty 20

## 2022-04-26 NOTE — TOC Progression Note (Signed)
Transition of Care (TOC) - Progression Note    Patient Details  Name: Malcom Selmer. MRN: [id number] Date of Birth: [birth year]  Transition of Care [facility]) CM/SW Contact  Alberteen Sam, [place] Phone Number: 04/26/2022, 3:43 PM  Clinical Narrative:     [facility] reports having no beds however their sister facility [facility] does which is son's second choice. [place] with [facility] reports they are able to accept patient tomorrow, MD notified. Tomorrow at [employer] please contact the supervisor at [facility] at [phone] when discharge paperwork is ready.  Expected Discharge Plan: [place] Barriers to Discharge: Continued Medical Work up  Expected Discharge Plan and Services Expected Discharge Plan: [place] Choice: [place] arrangements for the past 2 months: Single Family Home                                       Social Determinants of Health (SDOH) Interventions    Readmission Risk Interventions    08/09/2021   10:49 AM  Readmission Risk Prevention Plan  Transportation Screening Complete  PCP or Specialist Appt within 3-5 Days Complete  HRI or [place] Complete  Social Work Consult for [place] Planning/Counseling Complete  Palliative Care Screening Not Applicable  Medication Review Press photographer) Complete

## 2022-04-26 NOTE — Progress Notes (Signed)
Physical Therapy Treatment Patient Details Name: Wesley Anderson. MRN: [id number] DOB: Mar 28, 1946 Today's Date: 04/26/2022   History of Present Illness Buster Phyllip Claw. is a 76yoM admitted on 04/22/22 c AMS & fever. Pt tested positive for Covid 19. PMH: GERD, depression, combined heart failure, NYHA class 1, Parkinson's disease s/p brain stimulator, Parkinsonian dysautonomic orthostatic hypotension. Pt has been active in [place] since May after deconditioning associated with admission related to influenza illness.    PT Comments    Pt recently back in bed, agreeable to session, familiar with [facility] Curia from prior services in May. Pt moving better today, minA for bed mobility, minGuard for transfers and AMB with minA at one point due to anterior LOB and fall commencement. Pt does well with seated breaks. Pt asking about another purewick as his was not replaced- he cannot use a urinal jug due to chronic UE tremor. Also discussed compression stockings given his ongoing history of LEE which appears to be progressing. Pt is agreeable to trial of ted hose here, author reached out to attending for order. Pt progressing well today goals, moving much better since initial evaluation. Pt remains weak and unsteady but may be able to update recs to home pending caregiver assistance and continued progress.    Recommendations for follow up therapy are one component of a multi-disciplinary discharge planning process, led by the attending physician.  Recommendations may be updated based on patient status, additional functional criteria and insurance authorization.  Follow Up Recommendations  Skilled nursing-short term rehab (<3 hours/day) Can patient physically be transported by private vehicle: No   Assistance Recommended at Discharge Frequent or constant Supervision/Assistance  Patient can return home with the following A lot of help with bathing/dressing/bathroom;A lot of help with walking and/or  transfers;Assist for transportation;Assistance with cooking/housework;Help with stairs or ramp for entrance   Equipment Recommendations  None recommended by PT    Recommendations for Other Services OT consult     Precautions / Restrictions Precautions Precautions: Fall Precaution Comments: History of orthostatis Restrictions Weight Bearing Restrictions: No     Mobility  Bed Mobility Overal bed mobility: Needs Assistance Bed Mobility: Supine to Sit, Sit to Supine     Supine to sit: Min assist Sit to supine: Min assist   General bed mobility comments: weak but does 90% of this on his own    Transfers Overall transfer level: Needs assistance Equipment used: Rolling walker (2 wheels) Transfers: Sit to/from Stand Sit to Stand: Min guard                Ambulation/Gait Ambulation/Gait assistance: Min guard Gait Distance (Feet): 200 Feet Assistive device: Rolling walker (2 wheels)   Gait velocity: increased     General Gait Details: L lateral lean, impaired step width LLE (close to midline), decreased weight shift to R, weak LLE with flexed knee and progressive scuffing   Stairs             Wheelchair Mobility    Modified Rankin (Stroke Patients Only)       Balance                                            Cognition Arousal/Alertness: Awake/alert Behavior During Therapy: WFL for tasks assessed/performed, Impulsive Overall Cognitive Status: Within Functional Limits for tasks assessed  Exercises Other Exercises Other Exercises: STS from EOB x3 Other Exercises: Marching in place with RW Other Exercises: 15f AMB in room with RW Other Exercises: 2068fin room with RW Other Exercises: 4067fMB at bedside to window, alternate foward and retro stepping    General Comments        Pertinent Vitals/Pain Pain Assessment Pain Assessment: No/denies pain    Home Living                           Prior Function            PT Goals (current goals can now be found in the care plan section) Acute Rehab PT Goals Patient Stated Goal: return to PLOF PT Goal Formulation: With patient Time For Goal Achievement: 05/07/22 Potential to Achieve Goals: Good Progress towards PT goals: Progressing toward goals    Frequency    Min 2X/week      PT Plan Current plan remains appropriate    Co-evaluation              AM-PAC PT "6 Clicks" Mobility   Outcome Measure  Help needed turning from your back to your side while in a flat bed without using bedrails?: A Lot Help needed moving from lying on your back to sitting on the side of a flat bed without using bedrails?: A Lot Help needed moving to and from a bed to a chair (including a wheelchair)?: A Little Help needed standing up from a chair using your arms (e.g., wheelchair or bedside chair)?: A Little Help needed to walk in hospital room?: A Lot Help needed climbing 3-5 steps with a railing? : A Lot 6 Click Score: 14    End of Session Equipment Utilized During Treatment: Gait belt Activity Tolerance: Patient tolerated treatment well;No increased pain Patient left: in chair;with chair alarm set;with call bell/phone within reach Nurse Communication: Mobility status PT Visit Diagnosis: Unsteadiness on feet (R26.81);Muscle weakness (generalized) (M62.81);Difficulty in walking, not elsewhere classified (R26.2);Other abnormalities of gait and mobility (R26.89)     Time: [id number] Time Calculation (min) (ACUTE ONLY): 30 min  Charges:  $Therapeutic Exercise: 23-37 mins                    4:44 PM, 04/26/22 AllEtta GrandchildT, DPT Physical Therapist - [phone]  Arvis Miguez C 04/26/2022, 4:40 PM

## 2022-04-26 NOTE — Progress Notes (Signed)
Progress Note   Patient: Wesley Anderson. TOI:[id number] DOB: 1945-08-21 DOA: 04/22/2022     0 DOS: the patient was seen and examined on 04/26/2022   Brief hospital course: Wesley Anderson is a 76 year old male with history of GERD, depression, combined heart failure, NYHA class I, history of tonsillectomy, Parkinson's disease status post brain stimulator placement who presented to the ED on 04/22/2022 for evaluation of worsening confusion, generalized weakness and fever.  At baseline, pt is ambulatory with rolling walker, also uses a wheelchair.    ED vitals -- temp 99.7 F, RR 24, HR 77, BP 119/69, spO2 96% on room air.  Labs were mostly unremarkable including WBC 6.5, Cr slightly above baseline 1.27, platelets 145k, normal lactic acid and procal < 0.10.  UA without signs of infection.  COVID PCR was tested positive.  Pt was admitted to the hospital. Started on Paxlovid and supportive care for Covid-19 illness.  PT evaluated patient. He is profoundly more weak than at baseline, SNF/rehab recommended.     Assessment and Plan: * COVID-19 virus infection Continue Paxlovid. No steroids indicated, not hypoxic. Vitamin C and zinc. Supportive care: bronchodilators PRN, Tussionex, Mucinex  Acute metabolic encephalopathy Presumed secondary to COVID-19 infection --Mgmt as outlined --Delirium precautions --Neuro checks --PT / OOB / mobilize as much as tolerated  AKI (acute kidney injury) ([place]) Given 500 mL bolus in ED, then continued on NS at 100 mL/h x 5 hours. Cr improved from 1.27 >> 1.10 today. -- Monitor off IV fluids - Encourage p.o. hydration - BMP in AM  Depression With hypersexuality - Continue sertraline 100 mg daily  Confusion See acute metabolic encephalopathy.  Fever and chills Due to Covid infection. Tylenol PRN fever  Weakness Generalized in setting of Covid infection. PT evaluation SNF recommended TOC consulted Fall precautions  S/P deep brain  stimulator placement - In 2015  Cognitive impairment --continue donepezil 10 mg nightly  Parkinson's disease ([place]) With Parkinson's dementia Continue home Sinemet Seroquel 75 mg nightly was decreased to 25 mg nightly at the recommendation of pharmacy in setting of Paxlovid initiation, due to drug interactions.        Subjective: Pt seen awake resting in bed today.  Reports he continues to feel better.  Frustrated that he has not been able to work with therapy and be up out of bed.  Hopeful to return home today or tomorrow with supportive in-home caregivers.  Son is working on this.     Physical Exam: Vitals:   04/25/22 1543 04/25/22 2222 04/26/22 0530 04/26/22 0859  BP: 107/86 129/76 (!) 153/75 131/79  Pulse: (!) 58 (!) 52 (!) 46 (!) 51  Resp: '18 18 16 18  '$ Temp: 98.1 F (36.7 C) 98.3 F (36.8 C) 98 F (36.7 C) 98.5 F (36.9 C)  TempSrc:      SpO2: 95% 96% 93% 94%  Weight:      Height:       General exam: awake, alert, mildly ill-appearing, no acute distress HEENT: moist mucus membranes, hearing grossly normal  Respiratory system: on room air, normal respiratory effort.  Lungs clear bilaterally Cardiovascular system: RRR, no pedal edema.   Central nervous system: A&O x person and place. normal speech, mild resting upper extremity tremor R>L Skin: dry, intact, normal temperature Psychiatry: normal mood, flat affect, judgment and insight appear normal   Data Reviewed:  Labs today -  unremarkable BMP  Family Communication: son updated by phone evening of 9/11.  He is working on  arranging in-home caregiver support so patient does not have to wait for SNF  Disposition: Status is: Observation The patient remains OBS appropriate and will d/c before 2 midnights.  Remains in the hospital due to profound weakness.  Requires 10-day isolation for SNF or caregiver support to discharge home.  Medically stable.   Planned Discharge Destination: Skilled nursing facility versus  home with caregiver support and home health PT OT    Time spent: 40 minutes  Author: Ezekiel Slocumb, DO 04/26/2022 2:40 PM  For on call review [email or web address].

## 2022-04-26 NOTE — Progress Notes (Signed)
Occupational Therapy Treatment Patient Details Name: Wesley Anderson. MRN: [id number] DOB: 1946/05/18 Today's Date: 04/26/2022   History of present illness Pt is a 76 y/o M admitted on 04/22/22 after presenting to the ED with c/o AMS & fever. Pt tested positive for Covid 19. PMH: GERD, depression, combined heart failure, NYHA class 1, Parkinson's disease s/p brain stimulator   OT comments  Wesley Anderson was seen for OT treatment on this date. Upon arrival to room pt awake, oriented to person, place, and limited situation. Agreeable to OT tx session. Pt eager to "change my position" and agreeable to simulated toilet transfer and transition to room recliner. Pt performs bed mobility with supervision for safety. Increased time/effort to perform. He requires initial MIN A to achieve standing position and intermittent MIN A during functional transfer to maintain standing balance. Once seated in recliner he performs seated grooming tasks with set-up assist and min cueing for sequencing of oral care. Pt educated on safety and falls prevention t/o session and return verbalized understanding of instruction provided. Pt making good progress toward goals and continues to benefit from skilled OT services to maximize return to PLOF and minimize risk of future falls, injury, caregiver burden, and readmission. Will continue to follow POC. Discharge recommendation remains appropriate.     Recommendations for follow up therapy are one component of a multi-disciplinary discharge planning process, led by the attending physician.  Recommendations may be updated based on patient status, additional functional criteria and insurance authorization.    Follow Up Recommendations  Skilled nursing-short term rehab (<3 hours/day)    Assistance Recommended at Discharge    Patient can return home with the following  A lot of help with walking and/or transfers;A lot of help with bathing/dressing/bathroom   Equipment  Recommendations  Other (comment) (TBD at next venue of care.)    Recommendations for Other Services      Precautions / Restrictions Precautions Precautions: Fall Restrictions Weight Bearing Restrictions: No       Mobility Bed Mobility Overal bed mobility: Needs Assistance Bed Mobility: Supine to Sit     Supine to sit: HOB elevated, Supervision     General bed mobility comments: Increased time/effort to perform.    Transfers Overall transfer level: Needs assistance Equipment used: Rolling walker (2 wheels) Transfers: Sit to/from Stand Sit to Stand: Min assist     Step pivot transfers: Min guard, Min assist           Balance Overall balance assessment: Needs assistance Sitting-balance support: Feet supported, Single extremity supported Sitting balance-Leahy Scale: Good Sitting balance - Comments: steady static sitting at EOB   Standing balance support: Reliant on assistive device for balance, During functional activity, Bilateral upper extremity supported Standing balance-Leahy Scale: Fair Standing balance comment: Increased stability in standing. Requires intermittent MIN A with weight shifting to maintain safe upright positioning.                           ADL either performed or assessed with clinical judgement   ADL Overall ADL's : Needs assistance/impaired     Grooming: Sitting;Set up;Supervision/safety;Cueing for sequencing;Oral care;Wash/dry face                   Toilet Transfer: Minimal assistance;Rolling walker (2 wheels);Cueing for safety Toilet Transfer Details (indicate cue type and reason): Min A for simulated toilet transfer with cueing for safety t/o.         Functional mobility  during ADLs: Minimal assistance;Rolling walker (2 wheels)      Extremity/Trunk Assessment              Vision Patient Visual Report: No change from baseline     Perception     Praxis      Cognition Arousal/Alertness:  Awake/alert Behavior During Therapy: WFL for tasks assessed/performed, Impulsive Overall Cognitive Status: No family/caregiver present to determine baseline cognitive functioning Area of Impairment: Following commands, Safety/judgement                       Following Commands: Follows one step commands with increased time Safety/Judgement: Decreased awareness of safety, Decreased awareness of deficits   Problem Solving: Decreased initiation, Requires verbal cues, Requires tactile cues, Difficulty sequencing General Comments: A&O to person, place, and limited situation. Requires cueing for sequencing of multi-step tasks including oral care.        Exercises Other Exercises Other Exercises: OT facilitated bed/functional mobility, simulated toilet transfer, & seated UB grooming with assist as described above. Pt educated on safety, falls prevention, and compensatory strategies t/o session.    Shoulder Instructions       General Comments      Pertinent Vitals/ Pain       Pain Assessment Pain Assessment: No/denies pain  Home Living                                          Prior Functioning/Environment              Frequency  Min 2X/week        Progress Toward Goals  OT Goals(current goals can now be found in the care plan section)  Progress towards OT goals: Progressing toward goals  Acute Rehab OT Goals Patient Stated Goal: to go home OT Goal Formulation: With patient Time For Goal Achievement: 05/08/22 Potential to Achieve Goals: Wesley Anderson Discharge plan remains appropriate;Frequency remains appropriate    Co-evaluation                 AM-PAC OT "6 Clicks" Daily Activity     Outcome Measure   Help from another person eating meals?: A Little Help from another person taking care of personal grooming?: A Little Help from another person toileting, which includes using toliet, bedpan, or urinal?: A Little Help from another  person bathing (including washing, rinsing, drying)?: A Lot Help from another person to put on and taking off regular upper body clothing?: A Little Help from another person to put on and taking off regular lower body clothing?: A Lot 6 Click Score: 16    End of Session Equipment Utilized During Treatment: Gait belt;Rolling walker (2 wheels)  OT Visit Diagnosis: Unsteadiness on feet (R26.81);Other abnormalities of gait and mobility (R26.89)   Activity Tolerance Patient tolerated treatment well   Patient Left in chair;with call bell/phone within reach;with chair alarm set   Nurse Communication Mobility status        Time: 4970-2637 OT Time Calculation (min): 26 min  Charges: OT General Charges $OT Visit: 1 Visit OT Treatments $Self Care/Home Management : 23-37 mins  Shara Blazing, M.S., OTR/L Ascom: [phone] 04/26/22, 3:06 PM

## 2022-04-27 DIAGNOSIS — U071 COVID-19: Secondary | ICD-10-CM | POA: Diagnosis not present

## 2022-04-27 LAB — CULTURE, BLOOD (ROUTINE X 2)
Culture: NO GROWTH
Culture: NO GROWTH
Special Requests: ADEQUATE
Special Requests: ADEQUATE

## 2022-04-27 MED ORDER — POLYETHYLENE GLYCOL 3350 17 G PO PACK: 17.0000 g | PACK | Freq: Every day | ORAL | 0 refills | Status: AC | PRN

## 2022-04-27 MED ORDER — GEMTESA 75 MG PO TABS: 75.0000 mg | ORAL_TABLET | Freq: Every evening | ORAL | Status: DC

## 2022-04-27 NOTE — Progress Notes (Signed)
Pt discharging to [facility] via EMS. Report called to [place] at [place]. Belongings packed up.EMS transport has been arranged by SW.

## 2022-04-27 NOTE — Progress Notes (Signed)
Physical Therapy Treatment Patient Details Name: Wesley Anderson. MRN: [id number] DOB: 12/07/1945 Today's Date: 04/27/2022   History of Present Illness Buster Zaeem Kandel. is a 76yoM admitted on 04/22/22 c AMS & fever. Pt tested positive for Covid 19. PMH: GERD, depression, combined heart failure, NYHA class 1, Parkinson's disease s/p brain stimulator, Parkinsonian dysautonomic orthostatic hypotension. Pt has been active in [place] since May after deconditioning associated with admission related to influenza illness.    PT Comments    Pt done eating, son at bedside. Pt measured and fitted for TED hose and then assisted with donning and eduction. Pt has 1 posterior LOB on first transfer, requires min-modA from author to correct. Pt AMB 167f with RW, then takes a rest. Pt AMB 2077fon second AMB trial, however final 4020fo not appear well controlled or safe- would correlate to chronic orthostatic BP and pt deciding to perform 1 minutes of standing ADL before commencing 2nd walk. Pt continues to progress but is not quite at baseline, still needs close supervision for all upright mobility to assistance with postural control.     Recommendations for follow up therapy are one component of a multi-disciplinary discharge planning process, led by the attending physician.  Recommendations may be updated based on patient status, additional functional criteria and insurance authorization.  Follow Up Recommendations  Skilled nursing-short term rehab (<3 hours/day) Can patient physically be transported by private vehicle: No   Assistance Recommended at Discharge Frequent or constant Supervision/Assistance  Patient can return home with the following A lot of help with bathing/dressing/bathroom;A lot of help with walking and/or transfers;Assist for transportation;Assistance with cooking/housework;Help with stairs or ramp for entrance   Equipment Recommendations  None recommended by PT     Recommendations for Other Services OT consult     Precautions / Restrictions Precautions Precautions: Fall Precaution Comments: History of orthostasis Restrictions Weight Bearing Restrictions: No     Mobility  Bed Mobility Overal bed mobility: Needs Assistance Bed Mobility: Supine to Sit     Supine to sit: Min assist          Transfers Overall transfer level: Needs assistance Equipment used: Rolling walker (2 wheels) Transfers: Sit to/from Stand Sit to Stand: Min assist, Min guard           General transfer comment: posterior LOB first of 3 times    Ambulation/Gait Ambulation/Gait assistance: Min guard Gait Distance (Feet): 150 Feet Assistive device: Rolling walker (2 wheels)             Stairs             Wheelchair Mobility    Modified Rankin (Stroke Patients Only)       Balance                                            Cognition Arousal/Alertness: Awake/alert Behavior During Therapy: WFL for tasks assessed/performed Overall Cognitive Status: Within Functional Limits for tasks assessed                                          Exercises Other Exercises Other Exercises: 150f34fB with RW Other Exercises: 200ft47f with RW, appears to be decreasing in extensor tone over final 30ft,31fe difficulty maintaining upright and control  Other Exercises: Education on use of TED hose and potential use of high level graded compression at DC: 22.5cm ankle, 33.4cm calf, 43cm lower leg length (medium long TED hose)    General Comments        Pertinent Vitals/Pain Pain Assessment Pain Assessment: No/denies pain    Home Living                          Prior Function            PT Goals (current goals can now be found in the care plan section) Progress towards PT goals: Progressing toward goals    Frequency    Min 2X/week      PT Plan Current plan remains appropriate     Co-evaluation              AM-PAC PT "6 Clicks" Mobility   Outcome Measure  Help needed turning from your back to your side while in a flat bed without using bedrails?: A Lot Help needed moving from lying on your back to sitting on the side of a flat bed without using bedrails?: A Lot Help needed moving to and from a bed to a chair (including a wheelchair)?: A Little Help needed standing up from a chair using your arms (e.g., wheelchair or bedside chair)?: A Little Help needed to walk in hospital room?: A Lot Help needed climbing 3-5 steps with a railing? : A Lot 6 Click Score: 14    End of Session Equipment Utilized During Treatment: Gait belt Activity Tolerance: Patient tolerated treatment well;No increased pain Patient left: in chair;with chair alarm set;with call bell/phone within reach Nurse Communication: Mobility status PT Visit Diagnosis: Unsteadiness on feet (R26.81);Muscle weakness (generalized) (M62.81);Difficulty in walking, not elsewhere classified (R26.2);Other abnormalities of gait and mobility (R26.89)     Time: 8864-8472 PT Time Calculation (min) (ACUTE ONLY): 30 min  Charges:  $Therapeutic Exercise: 23-37 mins                    10:13 AM, 04/27/22 Etta Grandchild, PT, DPT Physical Therapist - [facility]  [phone] ([place])    Verda Mehta C 04/27/2022, 10:10 AM

## 2022-04-27 NOTE — Plan of Care (Signed)

## 2022-04-27 NOTE — Discharge Summary (Addendum)
Wesley Anderson. NGE:[id number] DOB: 11-19-1945 DOA: 04/22/2022  PCP: Adin Hector, MD  Admit date: 04/22/2022 Discharge date: 04/27/2022  Time spent: 35 minutes  Recommendations for Outpatient Follow-up:  Pcp f/u     Discharge Diagnoses:  Principal Problem:   COVID-19 virus infection Active Problems:   Acute metabolic encephalopathy   AKI (acute kidney injury) ([place])   Parkinson's disease ([place])   Cognitive impairment   S/P deep brain stimulator placement   Weakness   Fever and chills   Confusion   Depression   Discharge Condition: stable  Diet recommendation: heart healthy  Filed Weights   04/22/22 1238  Weight: 80.1 kg    History of present illness:  From admission h and p  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 At bedside he was able to tell me his full name, the current calendar year and the current month with difficulty and he knows he is in the hospital at [facility]. He was not able to tell me his age accurately.  He states he was 89 or 75.  He was able to identify his wife at bedside. He reports he feels better now than he did this morning. Spouse at bedside reports that this morning he forgot how to use the wheelchair.  She reports that at baseline he uses wheelchair and a walker to walk.  He can walk on his own however in the past he has forgotten how to walk sometimes and thus she prefers that he walks with a walker. She reports fever of 100.7 at home.  He endorses chills and poor p.o. intake.  He denies nausea, vomiting, diarrhea, dysuria pain, shortness of breath. He has had poor PO intake for the past several days. He reports the food just does not taste the same.   Hospital Course:  * COVID-19 virus  infection Treated with 5 days paxlovid. Mild infection No steroids indicated, not hypoxic. Supportive care: bronchodilators PRN, Tussionex, Mucinex   Acute metabolic encephalopathy Presumed secondary to COVID-19 infection   AKI (acute kidney injury) ([place]) Mild, resolved with hydration   Depression - Continue sertraline 100 mg daily     Weakness Generalized in setting of Covid infection. PT evaluation SNF recommended, d/c there   S/P deep brain stimulator placement - In 2015   Cognitive impairment --continue donepezil 10 mg nightly   Parkinson's disease ([place]) With Parkinson's dementia Continue home Sinemet Seroquel 75 mg nightly was decreased to 25 mg nightly at the recommendation of pharmacy in setting of Paxlovid initiation, due to drug interactions.    Procedures: none   Consultations: none  Discharge Exam: Vitals:   04/27/22 0518 04/27/22 0853  BP: (!) 123/92 (!) 140/107  Pulse: (!) 57 (!) 51  Resp: 18 18  Temp: 98.1 F (36.7 C) 98 F (36.7 C)  SpO2: 95% 96%    General exam: awake, alert, mildly ill-appearing, no acute distress HEENT: moist mucus membranes, hearing grossly normal  Respiratory system: on room air, normal respiratory effort.  Lungs clear bilaterally Cardiovascular system: RRR, no pedal edema.   Central nervous system: A&O x person and place. normal speech, mild resting upper extremity tremor R>L Skin: dry, intact, normal temperature Psychiatry: normal mood, flat affect, judgment and insight appear normal  Discharge Instructions   Discharge Instructions  Diet - low sodium heart healthy   Complete by: As directed    Increase activity slowly   Complete by: As directed       Allergies as of 04/27/2022       Reactions   Artane [trihexyphenidyl] Anxiety   Propofol Other (See Comments)   Delirium Delirium Delirium Delirium        Medication List     TAKE these medications    carbidopa-levodopa 25-100 MG  tablet Commonly known as: SINEMET IR Take 2 tablets by mouth 4 (four) times daily.   donepezil 10 MG tablet Commonly known as: ARICEPT Take 10 mg by mouth daily.   Elderberry Zinc/Vit C/Immune Lozg Use as directed 1 lozenge in the mouth or throat daily.   Gemtesa 75 MG Tabs Generic drug: Vibegron Take 75 mg by mouth every evening.   melatonin 3 MG Tabs tablet Take 9 mg by mouth at bedtime.   multivitamin tablet Take 1 tablet by mouth daily.   pantoprazole 40 MG tablet Commonly known as: PROTONIX Take 40 mg by mouth daily.   polyethylene glycol 17 g packet Commonly known as: MIRALAX / GLYCOLAX Take 17 g by mouth daily as needed for mild constipation or moderate constipation.   QUEtiapine 25 MG tablet Commonly known as: SEROQUEL Take 75 mg by mouth at bedtime.   sertraline 100 MG tablet Commonly known as: ZOLOFT Take 100 mg by mouth daily.   tamsulosin 0.4 MG Caps capsule Commonly known as: FLOMAX Take 0.4 mg by mouth daily.       Allergies  Allergen Reactions   Artane [Trihexyphenidyl] Anxiety   Propofol Other (See Comments)    Delirium Delirium Delirium Delirium     Follow-up Information     Adin Hector, MD Follow up.   Specialty: Internal Medicine Contact information: [address] [place] [address] [phone]                  The results of significant diagnostics from this hospitalization (including imaging, microbiology, ancillary and laboratory) are listed below for reference.    Significant Diagnostic Studies: DG Chest 2 View  Result Date: 04/22/2022 CLINICAL DATA:  slight cough, fever EXAM: CHEST - 2 VIEW COMPARISON:  Radiograph 08/08/2021 FINDINGS: Unchanged cardiomediastinal silhouette. There is no focal airspace consolidation. There is no pleural effusion. No pneumothorax. There is no acute osseous abnormality. Thoracic spondylosis. Bilateral shoulder degenerative changes. Unchanged nerve  stimulator overlying the right chest. IMPRESSION: No evidence of acute cardiopulmonary disease. Electronically Signed   By: Maurine Simmering M.D.   On: 04/22/2022 09:21    Microbiology: Recent Results (from the past 240 hour(s))  Resp Panel by RT-PCR (Flu A&B, Covid) Anterior Nasal Swab     Status: Abnormal   Collection Time: 04/22/22  8:05 AM   Specimen: Anterior Nasal Swab  Result Value Ref Range Status   SARS Coronavirus 2 by RT PCR POSITIVE (A) NEGATIVE Final    Comment: (NOTE) SARS-CoV-2 target nucleic acids are DETECTED.  The SARS-CoV-2 RNA is generally detectable in upper respiratory specimens during the acute phase of infection. Positive results are indicative of the presence of the identified virus, but do not rule out bacterial infection or co-infection with other pathogens not detected by the test. Clinical correlation with patient history and other diagnostic information is necessary to determine patient infection status. The expected result is Negative.  Fact Sheet for Patients: EntrepreneurPulse.com.au  Fact Sheet for Healthcare Providers: IncredibleEmployment.be  This  test is not yet approved or cleared by the [place] and  has been authorized for detection and/or diagnosis of SARS-CoV-2 by FDA under an Emergency Use Authorization (EUA).  This EUA will remain in effect (meaning this test can be used) for the duration of  the COVID-19 declaration under Section 564(b)(1) of the A ct, 21 U.S.C. section 360bbb-3(b)(1), unless the authorization is terminated or revoked sooner.     Influenza A by PCR NEGATIVE NEGATIVE Final   Influenza B by PCR NEGATIVE NEGATIVE Final    Comment: (NOTE) The Xpert Xpress SARS-CoV-2/FLU/RSV plus assay is intended as an aid in the diagnosis of influenza from Nasopharyngeal swab specimens and should not be used as a sole basis for treatment. Nasal washings and aspirates are unacceptable for Xpert  Xpress SARS-CoV-2/FLU/RSV testing.  Fact Sheet for Patients: EntrepreneurPulse.com.au  Fact Sheet for Healthcare Providers: IncredibleEmployment.be  This test is not yet approved or cleared by the [place] FDA and has been authorized for detection and/or diagnosis of SARS-CoV-2 by FDA under an Emergency Use Authorization (EUA). This EUA will remain in effect (meaning this test can be used) for the duration of the COVID-19 declaration under Section 564(b)(1) of the Act, 21 U.S.C. section 360bbb-3(b)(1), unless the authorization is terminated or revoked.  Performed at [facility], [place], [place] [address]   Respiratory (~20 pathogens) panel by PCR     Status: None   Collection Time: 04/22/22  8:05 AM   Specimen: Anterior Nasal Swab; Respiratory  Result Value Ref Range Status   Adenovirus NOT DETECTED NOT DETECTED Final   Coronavirus 229E NOT DETECTED NOT DETECTED Final    Comment: (NOTE) The Coronavirus on the Respiratory Panel, DOES NOT test for the novel  Coronavirus (2019 nCoV)    Coronavirus HKU1 NOT DETECTED NOT DETECTED Final   Coronavirus NL63 NOT DETECTED NOT DETECTED Final   Coronavirus OC43 NOT DETECTED NOT DETECTED Final   Metapneumovirus NOT DETECTED NOT DETECTED Final   Rhinovirus / Enterovirus NOT DETECTED NOT DETECTED Final   Influenza A NOT DETECTED NOT DETECTED Final   Influenza B NOT DETECTED NOT DETECTED Final   Parainfluenza Virus 1 NOT DETECTED NOT DETECTED Final   Parainfluenza Virus 2 NOT DETECTED NOT DETECTED Final   Parainfluenza Virus 3 NOT DETECTED NOT DETECTED Final   Parainfluenza Virus 4 NOT DETECTED NOT DETECTED Final   Respiratory Syncytial Virus NOT DETECTED NOT DETECTED Final   Bordetella pertussis NOT DETECTED NOT DETECTED Final   Bordetella Parapertussis NOT DETECTED NOT DETECTED Final   Chlamydophila pneumoniae NOT DETECTED NOT DETECTED Final   Mycoplasma pneumoniae NOT  DETECTED NOT DETECTED Final    Comment: Performed at [facility], [place]. [address] [place], [place] [address]  Urine Culture     Status: None   Collection Time: 04/22/22  8:06 AM   Specimen: Urine, Clean Catch  Result Value Ref Range Status   Specimen Description   Final    URINE, CLEAN CATCH Performed at [facility], [address], [place], [place] [address]    Special Requests   Final    NONE Performed at [facility], [address] [place], [place] [address]    Culture   Final    NO GROWTH Performed at [facility], [place] [address] [place], [place] [address]    Report Status 04/23/2022 FINAL  Final  Blood culture (routine x 2)     Status: None  Collection Time: 04/22/22  8:45 AM   Specimen: BLOOD  Result Value Ref Range Status   Specimen Description BLOOD RIGHT ARM  Final   Special Requests   Final    BOTTLES DRAWN AEROBIC AND ANAEROBIC Blood Culture adequate volume   Culture   Final    NO GROWTH 5 DAYS Performed at [facility], [address] [place], [place] [address]    Report Status 04/27/2022 FINAL  Final  Blood culture (routine x 2)     Status: None   Collection Time: 04/22/22  2:43 PM   Specimen: BLOOD  Result Value Ref Range Status   Specimen Description BLOOD LEFT ANTECUBITAL  Final   Special Requests   Final    BOTTLES DRAWN AEROBIC AND ANAEROBIC Blood Culture adequate volume   Culture   Final    NO GROWTH 5 DAYS Performed at [facility], [address], [place], [place] [address]    Report Status 04/27/2022 FINAL  Final     Labs: Basic Metabolic Panel: Recent Labs  Lab 04/22/22 0845 04/22/22 1443 04/23/22 0404 04/24/22 0447 04/26/22 0451  NA 139  --  140 141 144  K 3.8  --  3.9 3.8 3.9  CL 108  --  110 109 110  CO2 25  --  '24 25 26  '$ GLUCOSE 101*  --  96 99 96  BUN 18  --  '16 15 23  '$ CREATININE 1.27*  --  1.10 1.16 1.22  CALCIUM 9.2  --  8.9 9.0 9.7  MG  --  2.3  --   --   --   PHOS   --  3.4  --   --   --    Liver Function Tests: Recent Labs  Lab 04/22/22 0845 04/24/22 0447  AST 19 23  ALT 11 5  ALKPHOS 80 66  BILITOT 1.4* 1.3*  PROT 7.1 6.9  ALBUMIN 3.9 3.5   No results for input(s): "LIPASE", "AMYLASE" in the last 168 hours. No results for input(s): "AMMONIA" in the last 168 hours. CBC: Recent Labs  Lab 04/22/22 0845 04/23/22 0404 04/24/22 0447 04/25/22 0713  WBC 6.5 7.2 7.4 7.5  HGB 13.5 12.8* 14.0 14.3  HCT 41.3 38.1* 41.5 42.9  MCV 93.7 91.6 90.4 92.1  PLT 145* 143* 148* 155   Cardiac Enzymes: No results for input(s): "CKTOTAL", "CKMB", "CKMBINDEX", "TROPONINI" in the last 168 hours. BNP: BNP (last 3 results) No results for input(s): "BNP" in the last 8760 hours.  ProBNP (last 3 results) No results for input(s): "PROBNP" in the last 8760 hours.  CBG: No results for input(s): "GLUCAP" in the last 168 hours.     Signed:  Desma Maxim MD.  [facility] 04/27/2022, 9:12 AM

## 2022-04-27 NOTE — TOC Transition Note (Addendum)
Transition of Care [facility]) - CM/SW Discharge Note   Patient Details  Name: Wesley Anderson. MRN: [id number] Date of Birth: [birth year]  Transition [facility]) CM/SW Contact:  Laurena Slimmer, RN Phone Number: 04/27/2022, 9:49 AM   Clinical Narrative:    Corinna Capra, spoke with admissions rep who confirmed they had discharge summary. Patient will go to room 160.Nurse will call report to [phone] Patient's son contacted regarding discharge today Nurse notified with where to call report and room number. Advised to contact son per his request when patient discharges. TOC signing off.      Barriers to Discharge: Continued Medical Work up   Patient Goals and CMS Choice Patient states their goals for this hospitalization and ongoing recovery are:: SNF CMS Medicare.gov Compare Post Acute Care list provided to:: Patient Choice offered to / list presented to : Patient  Discharge Placement                       Discharge Plan and Services     Post Acute Care Choice: [place]                               Social Determinants of Health (SDOH) Interventions     Readmission Risk Interventions    08/09/2021   10:49 AM  Readmission Risk Prevention Plan  Transportation Screening Complete  PCP or Specialist Appt within 3-5 Days Complete  HRI or [place] Complete  Social Work Consult for [place] Planning/Counseling Complete  Palliative Care Screening Not Applicable  Medication Review [employer]) Complete

## 2022-04-28 ENCOUNTER — Encounter: Payer: Self-pay | Admitting: Adult Health

## 2022-04-28 ENCOUNTER — Non-Acute Institutional Stay (SKILLED_NURSING_FACILITY): Payer: Medicare HMO | Admitting: Adult Health

## 2022-04-28 ENCOUNTER — Ambulatory Visit: Payer: Medicare HMO

## 2022-04-28 DIAGNOSIS — I5022 Chronic systolic (congestive) heart failure: Secondary | ICD-10-CM

## 2022-04-28 DIAGNOSIS — G9341 Metabolic encephalopathy: Secondary | ICD-10-CM

## 2022-04-28 DIAGNOSIS — Z9689 Presence of other specified functional implants: Secondary | ICD-10-CM

## 2022-04-28 DIAGNOSIS — G20 Parkinson's disease: Secondary | ICD-10-CM

## 2022-04-28 DIAGNOSIS — I77819 Aortic ectasia, unspecified site: Secondary | ICD-10-CM

## 2022-04-28 DIAGNOSIS — R4189 Other symptoms and signs involving cognitive functions and awareness: Secondary | ICD-10-CM

## 2022-04-28 DIAGNOSIS — F32A Depression, unspecified: Secondary | ICD-10-CM

## 2022-04-28 DIAGNOSIS — G20A1 Parkinson's disease without dyskinesia, without mention of fluctuations: Secondary | ICD-10-CM

## 2022-04-28 DIAGNOSIS — U071 COVID-19: Secondary | ICD-10-CM

## 2022-04-28 NOTE — Progress Notes (Signed)
Location:  Occupational psychologist of Service:  SNF (31) Provider:   Cindi Carbon, [place] [phone]   Adin Hector, MD  Patient Care Team: Adin Hector, MD as PCP - General (Internal Medicine) Lollie Sails, MD (Inactive) as Consulting Physician (Gastroenterology) Bary Castilla Forest Gleason, MD (General Surgery)  Extended Emergency Contact Information Primary Emergency Contact: Rodney Langton Address: [address]          [place], [place] [address] Johnnette Litter of [place] Phone: [phone] Mobile Phone: [phone] Relation: Spouse Secondary Emergency Contact: [place], [place] Mobile Phone: [phone] Relation: Son  Code Status:  Full code  Goals of care: Advanced Directive information    04/28/2022    4:33 PM  Advanced Directives  Does Patient Have a Medical Advance Directive? Yes  Type of Paramedic of [place];Living will  Copy of [place] in Chart? Yes - validated most recent copy scanned in chart (See row information)     Chief Complaint  Patient presents with   Acute Visit    F/u covid infection     HPI:  Pt is a 76 y.o. male seen today for a hospital f/u s/p admission from 04/22/22-04/27/22. 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 Found to have covid. Given 5 days of paxlovid. Did not require oxygen. Had mild AKI due to dehydration and was given fluids. Took a few days to mentally clear. Now on day 6/10 of isolation and back to baseline. His wife is his caretaker. He is ambulatory with a walker. Has tremor right hand dominant. Needs assistance with ADLs. Has incontinence. He is from the [place] and [place] with [place]. He has  chronic edema and per the notes his PCP has avoiding using lasix.   Past Medical History:  Diagnosis Date   AAA (abdominal aortic aneurysm) (HCC)    Alcohol dependence in remission ([place])    Cancer ([place])    skin    Colon polyps    ED (erectile dysfunction)    GERD (gastroesophageal reflux disease)    Hematest positive stools    Melanoma of skin (HCC)    Parkinson's disease ([place])    Renal cyst, right    Restless leg syndrome    Past Surgical History:  Procedure Laterality Date   CHOLECYSTECTOMY     COLON SURGERY     colectomy with anastamosis   COLONOSCOPY WITH PROPOFOL N/A 11/07/2016   Procedure: COLONOSCOPY WITH PROPOFOL;  Surgeon: Lollie Sails, MD;  Location: [facility] ENDOSCOPY;  Service: Endoscopy;  Laterality: N/A;   COLONOSCOPY WITH PROPOFOL N/A 09/06/2018   Procedure: COLONOSCOPY WITH PROPOFOL;  Surgeon: Lollie Sails, MD;  Location: [facility] ENDOSCOPY;  Service: Endoscopy;  Laterality: N/A;   COLONOSCOPY WITH PROPOFOL N/A 05/16/2019   Procedure: COLONOSCOPY WITH PROPOFOL;  Surgeon: Lollie Sails, MD;  Location: [facility] ENDOSCOPY;  Service: Endoscopy;  Laterality: N/A;   CYST EXCISION     vocal cord   DBS placement     deep brain implant     ESOPHAGOGASTRODUODENOSCOPY (EGD) WITH PROPOFOL N/A 09/06/2018   Procedure: ESOPHAGOGASTRODUODENOSCOPY (EGD) WITH PROPOFOL;  Surgeon: Lollie Sails, MD;  Location: [facility] ENDOSCOPY;  Service: Endoscopy;  Laterality: N/A;   MELANOMA EXCISION  TONSILLECTOMY      Allergies  Allergen Reactions   Artane [Trihexyphenidyl] Anxiety   Propofol Other (See Comments)    Delirium Delirium Delirium Delirium     Outpatient Encounter Medications as of 04/28/2022  Medication Sig   carbidopa-levodopa (SINEMET IR) 25-100 MG tablet Take 2 tablets by mouth 4 (four) times daily.   donepezil (ARICEPT) 10 MG tablet Take 10 mg by mouth daily.   melatonin 3 MG TABS tablet Take 9 mg by mouth at bedtime.   Misc Natural Products (ELDERBERRY  ZINC/VIT C/IMMUNE) LOZG Use as directed 1 lozenge in the mouth or throat daily.   Multiple Vitamin (MULTIVITAMIN) tablet Take 1 tablet by mouth daily.   pantoprazole (PROTONIX) 40 MG tablet Take 40 mg by mouth daily.   polyethylene glycol (MIRALAX / GLYCOLAX) 17 g packet Take 17 g by mouth daily as needed for mild constipation or moderate constipation.   QUEtiapine (SEROQUEL) 25 MG tablet Take 75 mg by mouth at bedtime.   sertraline (ZOLOFT) 100 MG tablet Take 100 mg by mouth daily.   tamsulosin (FLOMAX) 0.4 MG CAPS capsule Take 0.4 mg by mouth daily.   Vibegron (GEMTESA) 75 MG TABS Take 75 mg by mouth every evening.   No facility-administered encounter medications on file as of 04/28/2022.    Review of Systems  Constitutional:  Positive for activity change. Negative for appetite change, chills, diaphoresis, fatigue, fever and unexpected weight change.  Respiratory:  Negative for cough, shortness of breath, wheezing and stridor.   Cardiovascular:  Positive for leg swelling. Negative for chest pain and palpitations.  Gastrointestinal:  Negative for abdominal distention, abdominal pain, constipation and diarrhea.  Genitourinary:  Negative for difficulty urinating and dysuria.  Musculoskeletal:  Positive for gait problem. Negative for arthralgias, back pain, joint swelling and myalgias.  Neurological:  Positive for tremors and weakness. Negative for dizziness, seizures, syncope, facial asymmetry, speech difficulty and headaches.  Hematological:  Negative for adenopathy. Does not bruise/bleed easily.  Psychiatric/Behavioral:  Negative for agitation, behavioral problems and confusion.     Immunization History  Administered Date(s) Administered   Tdap 12/20/2019   Pertinent  Health Maintenance Due  Topic Date Due   INFLUENZA VACCINE  Never done   COLONOSCOPY (Pts 45-53yr Insurance coverage will need to be confirmed)  05/15/2029      04/25/2022    9:10 AM 04/25/2022    9:15 PM 04/26/2022     1:00 PM 04/26/2022    9:15 PM 04/27/2022    9:36 AM  Fall Risk  Patient Fall Risk Level High fall risk High fall risk High fall risk High fall risk High fall risk   Functional Status Survey:    Vitals:   04/28/22 1632  BP: (!) 140/80  Pulse: 68  Resp: 18  Temp: 97.6 F (36.4 C)  SpO2: 96%  Weight: 166 lb 3.2 oz (75.4 kg)   Body mass index is 26.03 kg/m. Physical Exam Vitals and nursing note reviewed.  Constitutional:      General: He is not in acute distress.    Appearance: He is not diaphoretic.  HENT:     Head: Normocephalic and atraumatic.     Mouth/Throat:     Mouth: Mucous membranes are moist.     Pharynx: Oropharynx is clear.  Neck:     Thyroid: No thyromegaly.     Vascular: No JVD.     Trachea: No tracheal deviation.  Cardiovascular:     Rate and Rhythm: Normal rate and regular rhythm.  Heart sounds: No murmur heard. Pulmonary:     Effort: Pulmonary effort is normal. No respiratory distress.     Breath sounds: Normal breath sounds. No wheezing.  Abdominal:     General: Bowel sounds are normal. There is no distension.     Palpations: Abdomen is soft.     Tenderness: There is no abdominal tenderness.  Musculoskeletal:     Cervical back: Normal range of motion and neck supple.     Right lower leg: Edema present.     Left lower leg: Edema present.  Lymphadenopathy:     Cervical: No cervical adenopathy.  Skin:    General: Skin is warm and dry.  Neurological:     Mental Status: He is alert and oriented to person, place, and time.     Comments: Resting tremor R>L hand     Labs reviewed: Recent Labs    08/09/21 0848 08/10/21 0352 04/22/22 1443 04/23/22 0404 04/24/22 0447 04/26/22 0451  NA 139   < >  --  140 141 144  K 3.4*   < >  --  3.9 3.8 3.9  CL 113*   < >  --  110 109 110  CO2 22   < >  --  '24 25 26  '$ GLUCOSE 111*   < >  --  96 99 96  BUN 21   < >  --  '16 15 23  '$ CREATININE 1.13   < >  --  1.10 1.16 1.22  CALCIUM 8.5*   < >  --  8.9 9.0  9.7  MG 2.0  --  2.3  --   --   --   PHOS  --   --  3.4  --   --   --    < > = values in this interval not displayed.   Recent Labs    08/08/21 0105 04/22/22 0845 04/24/22 0447  AST '26 19 23  '$ ALT '24 11 5  '$ ALKPHOS 87 80 66  BILITOT 2.4* 1.4* 1.3*  PROT 8.2* 7.1 6.9  ALBUMIN 4.0 3.9 3.5   Recent Labs    07/09/21 1337 07/09/21 2016 08/08/21 0105 08/08/21 1120 04/23/22 0404 04/24/22 0447 04/25/22 0713  WBC 6.5   < > 20.4*   < > 7.2 7.4 7.5  NEUTROABS 5.0  --  17.0*  --   --   --   --   HGB 12.4*   < > 14.4   < > 12.8* 14.0 14.3  HCT 35.9*   < > 43.1   < > 38.1* 41.5 42.9  MCV 90.2   < > 91.3   < > 91.6 90.4 92.1  PLT 138*   < > 200   < > 143* 148* 155   < > = values in this interval not displayed.   Lab Results  Component Value Date   TSH 2.043 07/09/2021   No results found for: "HGBA1C" No results found for: "CHOL", "HDL", "LDLCALC", "LDLDIRECT", "TRIG", "CHOLHDL"  Significant Diagnostic Results in last 30 days:  DG Chest 2 View  Result Date: 04/22/2022 CLINICAL DATA:  slight cough, fever EXAM: CHEST - 2 VIEW COMPARISON:  Radiograph 08/08/2021 FINDINGS: Unchanged cardiomediastinal silhouette. There is no focal airspace consolidation. There is no pleural effusion. No pneumothorax. There is no acute osseous abnormality. Thoracic spondylosis. Bilateral shoulder degenerative changes. Unchanged nerve stimulator overlying the right chest. IMPRESSION: No evidence of acute cardiopulmonary disease. Electronically Signed   By: Ileene Patrick.D.  On: 04/22/2022 09:21    Assessment/Plan 1. Acute metabolic encephalopathy Resolving due to dehydration and covid infection with underlying memory loss/PD Here for therapy with goal to progress back to home with his wife. He wants to go home as soon as possible. Needs therapy PT and OT  2. COVID-19 virus infection On Day 6/10 of isolation Completed paxlovid No current symptoms.   3. Parkinson's disease ([place]) Most prominent symptom  is tremor and progressive weakness On sinemet Wife is his caregiver   4. Chronic systolic (congestive) heart failure (HCC) EF 45-50% Has chronic leg edema Not currently on meds Followed by cardiology with [facility]   5. Cognitive impairment Need MMSE On aricept   6. S/P deep brain stimulator placement noted  7. Depression, unspecified depression type On zoloft, hx of hypersexuality   8. Aortic ectasia (HCC) Refused statin per PCP note.  CT of the abdomen and pelvis with contrast 8/22 at [facility], with no evidence of aneurysm indicated  9. BPH with urgency On Gemtesa and Flomax.    Family/ staff Communication: nurse  Labs/tests ordered:  CBC BMP Monday 9/18

## 2022-05-02 ENCOUNTER — Non-Acute Institutional Stay (SKILLED_NURSING_FACILITY): Payer: Medicare HMO | Admitting: Internal Medicine

## 2022-05-02 ENCOUNTER — Encounter: Payer: Self-pay | Admitting: Internal Medicine

## 2022-05-02 DIAGNOSIS — G20 Parkinson's disease: Secondary | ICD-10-CM | POA: Diagnosis not present

## 2022-05-02 DIAGNOSIS — R4189 Other symptoms and signs involving cognitive functions and awareness: Secondary | ICD-10-CM

## 2022-05-02 DIAGNOSIS — I5022 Chronic systolic (congestive) heart failure: Secondary | ICD-10-CM

## 2022-05-02 DIAGNOSIS — U071 COVID-19: Secondary | ICD-10-CM | POA: Diagnosis not present

## 2022-05-02 DIAGNOSIS — G9341 Metabolic encephalopathy: Secondary | ICD-10-CM

## 2022-05-02 DIAGNOSIS — F32A Depression, unspecified: Secondary | ICD-10-CM

## 2022-05-02 LAB — BASIC METABOLIC PANEL
BUN: 20 (ref 4–21)
CO2: 23 — AB (ref 13–22)
Chloride: 106 (ref 99–108)
Creatinine: 1.2 (ref 0.6–1.3)
Glucose: 103
Potassium: 4.2 mEq/L (ref 3.5–5.1)
Sodium: 140 (ref 137–147)

## 2022-05-02 LAB — CBC AND DIFFERENTIAL
HCT: 38 — AB (ref 41–53)
Hemoglobin: 13 — AB (ref 13.5–17.5)
Platelets: 266 10*3/uL (ref 150–400)
WBC: 9.8

## 2022-05-02 LAB — COMPREHENSIVE METABOLIC PANEL
Calcium: 9.8 (ref 8.7–10.7)
eGFR: 66

## 2022-05-02 LAB — CBC: RBC: 4.24 (ref 3.87–5.11)

## 2022-05-02 NOTE — Progress Notes (Unsigned)
Provider:   Location:  Occupational psychologist of Service:  SNF (31)  PCP: Adin Hector, MD Patient Care Team: Adin Hector, MD as PCP - General (Internal Medicine) Lollie Sails, MD (Inactive) as Consulting Physician (Gastroenterology) Bary Castilla Forest Gleason, MD (General Surgery)  Extended Emergency Contact Information Primary Emergency Contact: Rodney Langton Address: [address]          [place], [place] [address] Johnnette Litter of [place] Phone: [phone] Mobile Phone: [phone] Relation: Spouse Secondary Emergency Contact: Yuvan, [place] Mobile Phone: [phone] Relation: Son  Code Status: *** Goals of Care: Advanced Directive information    04/28/2022    4:33 PM  Advanced Directives  Does Patient Have a Medical Advance Directive? Yes  Type of Paramedic of [place];Living will  Copy of [place] in Chart? Yes - validated most recent copy scanned in chart (See row information)      Chief Complaint  Patient presents with   New Admit To SNF    HPI: Patient is a 76 y.o. male seen today for admission to Rehab also for discharge eval He was admitted in the hospital from 9/8 to 9/13 for COVID-19 infection Patient has a history of Parkinson disease s/p brain stimulator, history of GERD, depression, CHF, cognitive impairment His wife took him to the hospital for increased confusion and weakness Was positive for COVID Was treated with Paxlovid did not have any hypoxia Change in mental status was thought to be due to COVID-19 He was sent to rehab for therapy  Patient is doing well Mental status back to baseline Walking with his walker Answering appropriately and had no Cough or Fever or SOB Wife wants to take him home She is primary caregiver   Past Medical History:  Diagnosis Date   AAA (abdominal aortic aneurysm) ([place])    Alcohol dependence in remission ([place])    Cancer ([place])     skin    Colon polyps    ED (erectile dysfunction)    GERD (gastroesophageal reflux disease)    Hematest positive stools    Melanoma of skin ([place])    Parkinson's disease ([place])    Renal cyst, right    Restless leg syndrome    Past Surgical History:  Procedure Laterality Date   CHOLECYSTECTOMY     COLON SURGERY     colectomy with anastamosis   COLONOSCOPY WITH PROPOFOL N/A 11/07/2016   Procedure: COLONOSCOPY WITH PROPOFOL;  Surgeon: Lollie Sails, MD;  Location: [facility] ENDOSCOPY;  Service: Endoscopy;  Laterality: N/A;   COLONOSCOPY WITH PROPOFOL N/A 09/06/2018   Procedure: COLONOSCOPY WITH PROPOFOL;  Surgeon: Lollie Sails, MD;  Location: [facility] ENDOSCOPY;  Service: Endoscopy;  Laterality: N/A;   COLONOSCOPY WITH PROPOFOL N/A 05/16/2019   Procedure: COLONOSCOPY WITH PROPOFOL;  Surgeon: Lollie Sails, MD;  Location: [facility] ENDOSCOPY;  Service: Endoscopy;  Laterality: N/A;   CYST EXCISION     vocal cord   DBS placement     deep brain implant     ESOPHAGOGASTRODUODENOSCOPY (EGD) WITH PROPOFOL N/A 09/06/2018   Procedure: ESOPHAGOGASTRODUODENOSCOPY (EGD) WITH PROPOFOL;  Surgeon: Lollie Sails, MD;  Location: [facility] ENDOSCOPY;  Service: Endoscopy;  Laterality: N/A;   MELANOMA EXCISION     TONSILLECTOMY      reports that he has quit smoking. He has never used smokeless tobacco. He reports current alcohol use of about 6.0 standard drinks of alcohol per week. He reports that he does not  currently use drugs. Social History   Socioeconomic History   Marital status: Married    Spouse name: Not on file   Number of children: Not on file   Years of education: Not on file   Highest education level: Not on file  Occupational History   Not on file  Tobacco Use   Smoking status: Former   Smokeless tobacco: Never  Vaping Use   Vaping Use: Never used  Substance and Sexual Activity   Alcohol use: Yes    Alcohol/week: 6.0 standard drinks of alcohol    Types: 3 Shots of liquor, 3  Standard drinks or equivalent per week   Drug use: Not Currently   Sexual activity: Yes    Partners: Female  Other Topics Concern   Not on file  Social History Narrative   Not on file   Social Determinants of Health   Financial Resource Strain: Not on file  Food Insecurity: No Food Insecurity (04/22/2022)   Hunger Vital Sign    Worried About Running Out of Food in the Last Year: Never true    Ran Out of Food in the Last Year: Never true  Transportation Needs: No Transportation Needs (04/22/2022)   PRAPARE - Hydrologist (Medical): No    Lack of Transportation (Non-Medical): No  Physical Activity: Not on file  Stress: Not on file  Social Connections: Not on file  Intimate Partner Violence: Not At Risk (04/22/2022)   Humiliation, Afraid, Rape, and Kick questionnaire    Fear of Current or Ex-Partner: No    Emotionally Abused: No    Physically Abused: No    Sexually Abused: No    Functional Status Survey:    Family History  Problem Relation Age of Onset   Diabetes Father     Health Maintenance  Topic Date Due   Pneumonia Vaccine 72+ Years old (1 - PCV) Never done   Hepatitis C Screening  Never done   Zoster Vaccines- Shingrix (1 of 2) Never done   COVID-19 Vaccine (2 - Moderna risk series) 12/04/2019   INFLUENZA VACCINE  Never done   COLONOSCOPY (Pts 45-28yr Insurance coverage will need to be confirmed)  05/15/2029   TETANUS/TDAP  12/19/2029   HPV VACCINES  Aged Out    Allergies  Allergen Reactions   Artane [Trihexyphenidyl] Anxiety   Propofol Other (See Comments)    Delirium Delirium Delirium Delirium     Outpatient Encounter Medications as of 05/02/2022  Medication Sig   carbidopa-levodopa (SINEMET IR) 25-100 MG tablet Take 2 tablets by mouth 4 (four) times daily.   donepezil (ARICEPT) 10 MG tablet Take 10 mg by mouth daily.   melatonin 3 MG TABS tablet Take 9 mg by mouth at bedtime.   Misc Natural Products (ELDERBERRY ZINC/VIT  C/IMMUNE) LOZG Use as directed 1 lozenge in the mouth or throat daily.   Multiple Vitamin (MULTIVITAMIN) tablet Take 1 tablet by mouth daily.   pantoprazole (PROTONIX) 40 MG tablet Take 40 mg by mouth daily.   polyethylene glycol (MIRALAX / GLYCOLAX) 17 g packet Take 17 g by mouth daily as needed for mild constipation or moderate constipation.   QUEtiapine (SEROQUEL) 25 MG tablet Take 75 mg by mouth at bedtime.   sertraline (ZOLOFT) 100 MG tablet Take 100 mg by mouth daily.   tamsulosin (FLOMAX) 0.4 MG CAPS capsule Take 0.4 mg by mouth daily.   Vibegron (GEMTESA) 75 MG TABS Take 75 mg by mouth every evening.  No facility-administered encounter medications on file as of 05/02/2022.    Review of Systems  Constitutional:  Negative for activity change, appetite change and unexpected weight change.  HENT: Negative.    Respiratory:  Negative for cough and shortness of breath.   Cardiovascular:  Negative for leg swelling.  Gastrointestinal:  Negative for constipation.  Genitourinary:  Negative for frequency.  Musculoskeletal:  Negative for arthralgias, gait problem and myalgias.  Skin: Negative.  Negative for rash.  Neurological:  Negative for dizziness and weakness.  Psychiatric/Behavioral:  Positive for confusion. Negative for sleep disturbance.   All other systems reviewed and are negative.   Vitals:   05/02/22 1345  BP: 134/77  Pulse: 60  Resp: 17  Temp: (!) 97.3 F (36.3 C)  SpO2: 97%   There is no height or weight on file to calculate BMI. Physical Exam Vitals reviewed.  Constitutional:      Appearance: Normal appearance.  HENT:     Head: Normocephalic.     Nose: Nose normal.     Mouth/Throat:     Mouth: Mucous membranes are moist.     Pharynx: Oropharynx is clear.  Eyes:     Pupils: Pupils are equal, round, and reactive to light.  Cardiovascular:     Rate and Rhythm: Normal rate and regular rhythm.     Pulses: Normal pulses.     Heart sounds: No murmur  heard. Pulmonary:     Effort: Pulmonary effort is normal. No respiratory distress.     Breath sounds: Normal breath sounds. No rales.  Abdominal:     General: Abdomen is flat. Bowel sounds are normal.     Palpations: Abdomen is soft.  Musculoskeletal:        General: No swelling.     Cervical back: Neck supple.  Skin:    General: Skin is warm.  Neurological:     General: No focal deficit present.     Mental Status: He is alert.     Comments: Tremors present in UE Walking with his walker  Psychiatric:        Mood and Affect: Mood normal.        Thought Content: Thought content normal.     Labs reviewed: Basic Metabolic Panel: Recent Labs    08/09/21 0848 08/10/21 0352 04/22/22 1443 04/23/22 0404 04/24/22 0447 04/26/22 0451  NA 139   < >  --  140 141 144  K 3.4*   < >  --  3.9 3.8 3.9  CL 113*   < >  --  110 109 110  CO2 22   < >  --  '24 25 26  '$ GLUCOSE 111*   < >  --  96 99 96  BUN 21   < >  --  '16 15 23  '$ CREATININE 1.13   < >  --  1.10 1.16 1.22  CALCIUM 8.5*   < >  --  8.9 9.0 9.7  MG 2.0  --  2.3  --   --   --   PHOS  --   --  3.4  --   --   --    < > = values in this interval not displayed.   Liver Function Tests: Recent Labs    08/08/21 0105 04/22/22 0845 04/24/22 0447  AST '26 19 23  '$ ALT '24 11 5  '$ ALKPHOS 87 80 66  BILITOT 2.4* 1.4* 1.3*  PROT 8.2* 7.1 6.9  ALBUMIN 4.0 3.9 3.5  Recent Labs    08/08/21 0105  LIPASE 35   No results for input(s): "AMMONIA" in the last 8760 hours. CBC: Recent Labs    07/09/21 1337 07/09/21 2016 08/08/21 0105 08/08/21 1120 04/23/22 0404 04/24/22 0447 04/25/22 0713  WBC 6.5   < > 20.4*   < > 7.2 7.4 7.5  NEUTROABS 5.0  --  17.0*  --   --   --   --   HGB 12.4*   < > 14.4   < > 12.8* 14.0 14.3  HCT 35.9*   < > 43.1   < > 38.1* 41.5 42.9  MCV 90.2   < > 91.3   < > 91.6 90.4 92.1  PLT 138*   < > 200   < > 143* 148* 155   < > = values in this interval not displayed.   Cardiac Enzymes: Recent Labs     07/10/21 0801  CKTOTAL 88   BNP: Invalid input(s): "POCBNP" No results found for: "HGBA1C" Lab Results  Component Value Date   TSH 2.043 07/09/2021   Lab Results  Component Value Date   VITAMINB12 875 07/09/2021   Lab Results  Component Value Date   FOLATE 26.0 07/09/2021   Lab Results  Component Value Date   IRON 26 (L) 07/09/2021   TIBC 251 07/09/2021   FERRITIN 452 (H) 07/09/2021    Imaging and Procedures obtained prior to SNF admission: DG Chest 2 View  Result Date: 04/22/2022 CLINICAL DATA:  slight cough, fever EXAM: CHEST - 2 VIEW COMPARISON:  Radiograph 08/08/2021 FINDINGS: Unchanged cardiomediastinal silhouette. There is no focal airspace consolidation. There is no pleural effusion. No pneumothorax. There is no acute osseous abnormality. Thoracic spondylosis. Bilateral shoulder degenerative changes. Unchanged nerve stimulator overlying the right chest. IMPRESSION: No evidence of acute cardiopulmonary disease. Electronically Signed   By: Maurine Simmering M.D.   On: 04/22/2022 09:21    Assessment/Plan 1. Acute metabolic encephalopathy Mental status Back to normal Ok TO discharge home with wife  2. COVID-19 virus infection Symptoms resolved  3. Parkinson's disease ([place]) On Sinemet Follows with Neurology  4. Chronic systolic (congestive) heart failure (HCC) Not on Diuretics seems Euvolemic  5. Cognitive impairment On Aricept  6. Depression, unspecified depression type Continue Zoloft and Seroquel 7 Urinary Incontinence On Gemtesa and Flomax   Family/ staff Communication:   Labs/tests ordered:

## 2022-05-03 ENCOUNTER — Ambulatory Visit: Payer: Medicare HMO

## 2022-05-03 ENCOUNTER — Telehealth: Payer: Self-pay

## 2022-05-03 DIAGNOSIS — R262 Difficulty in walking, not elsewhere classified: Secondary | ICD-10-CM | POA: Diagnosis not present

## 2022-05-03 DIAGNOSIS — R293 Abnormal posture: Secondary | ICD-10-CM

## 2022-05-03 NOTE — Therapy (Signed)
OUTPATIENT PHYSICAL THERAPY TREATMENT NOTE     Patient Name: Wesley Anderson. MRN: [id number] DOB:1946/06/29, 76 y.o., male Today's Date: 05/03/2022  PCP: Ramonita Lab III, MD REFERRING PROVIDER: Ramonita Lab III, MD   PT End of Session - 05/03/22 1501     Visit Number 21    Number of Visits 73    Date for PT Re-Evaluation 06/30/22    Authorization Type 1    Authorization Time Period 10 progress report    PT Start Time 1501    PT Stop Time 1546    PT Time Calculation (min) 45 min    Equipment Utilized During Treatment Gait belt   RW   Activity Tolerance Patient tolerated treatment well    Behavior During Therapy WFL for tasks assessed/performed                             Past Medical History:  Diagnosis Date   AAA (abdominal aortic aneurysm) ([place])    Alcohol dependence in remission ([place])    Cancer ([place])    skin    Colon polyps    ED (erectile dysfunction)    GERD (gastroesophageal reflux disease)    Hematest positive stools    Melanoma of skin (HCC)    Parkinson's disease ([place])    Renal cyst, right    Restless leg syndrome    Past Surgical History:  Procedure Laterality Date   CHOLECYSTECTOMY     COLON SURGERY     colectomy with anastamosis   COLONOSCOPY WITH PROPOFOL N/A 11/07/2016   Procedure: COLONOSCOPY WITH PROPOFOL;  Surgeon: Lollie Sails, MD;  Location: [facility] ENDOSCOPY;  Service: Endoscopy;  Laterality: N/A;   COLONOSCOPY WITH PROPOFOL N/A 09/06/2018   Procedure: COLONOSCOPY WITH PROPOFOL;  Surgeon: Lollie Sails, MD;  Location: [facility] ENDOSCOPY;  Service: Endoscopy;  Laterality: N/A;   COLONOSCOPY WITH PROPOFOL N/A 05/16/2019   Procedure: COLONOSCOPY WITH PROPOFOL;  Surgeon: Lollie Sails, MD;  Location: [facility] ENDOSCOPY;  Service: Endoscopy;  Laterality: N/A;   CYST EXCISION     vocal cord   DBS placement     deep brain implant     ESOPHAGOGASTRODUODENOSCOPY (EGD) WITH PROPOFOL N/A 09/06/2018   Procedure:  ESOPHAGOGASTRODUODENOSCOPY (EGD) WITH PROPOFOL;  Surgeon: Lollie Sails, MD;  Location: [facility] ENDOSCOPY;  Service: Endoscopy;  Laterality: N/A;   MELANOMA EXCISION     TONSILLECTOMY     Patient Active Problem List   Diagnosis Date Noted   Chronic systolic (congestive) heart failure ([place]) 04/28/2022   COVID-19 virus infection 04/22/2022   Depression 04/22/2022   Acute gastroenteritis 08/08/2021   Sepsis ([place]) 08/08/2021   Confusion    Anemia 07/09/2021   Weakness 07/09/2021   Fever and chills 07/09/2021   Myalgia 07/09/2021   Influenza A 78/29/5621   Acute metabolic encephalopathy 30/86/5784   AKI (acute kidney injury) ([place]) 07/09/2020   Leukocytosis 07/09/2020   S/P deep brain stimulator placement 07/09/2020   Squamous cell carcinoma, 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 04/24/2020   Heme positive stool 05/27/2016   Alcohol dependence in remission ([place]) 07/31/2013   Cognitive impairment 02/22/2013   History of malignant melanoma of skin 05/15/2012   Parkinson's disease ([place]) 09/28/2011    REFERRING DIAG: Unsteadiness on feet, history of falls  THERAPY DIAG:  Difficulty in walking, not elsewhere classified - Plan: PT plan of care cert/re-cert  Abnormal posture - Plan: PT plan of care cert/re-cert  PERTINENT HISTORY:  Wesley Anderson is a 25yoM who is referred to [facility] OPPT for on-going imbalance in the setting of Parkinson's Disease. Pt was an active patient here in Nov 2022, but DC due to hospital admission for Flu in November and another due to encephalopathy in December. Pt working with HHPT/OT in the interim. Pt reports no falls in the past several months, had made efforts to be as safe as possible  PRECAUTIONS: Fall risk  SUBJECTIVE: Pt states that the swelling in his legs have gone down some. Feeling better. No pain currently. No falls recently.     PAIN:  Are you having pain? 0/10     TODAY'S TREATMENT:     Therapeutic exercoise  LE measurements   R: 8  cm inferior to tibial tuberosity  Circumference 35.5 cm   8 cm superior to medial malleolus   Circumference 23.5 cm   L  8 cm inferior to tibial tuberosity  Circumference 35 cm   8 cm superior to medial malleolus   Circumference 23.5 cm  Palpation: no pain with calf squeeze R and L   No abnormal warmth or redness or discoloration     SpO2 room air 99, HR 58   Gait x 6 minutes  312 ft, rw, stopped with 1:30 left in the clock   SpO2 100, HR 80 BPM  Sit <> stand 5x with B UE assist   20.6 seconds  Directed patient with gait with normal gait speed, with changes in speed, 180 degree pivot turn, with R and L cervical rotation position, with cervical flexion and extension position, stepping around obstacles, stepping over an obstacle, ascending and descending 4 regular steps with UE assist     Cues for large movements Improved exercise technique, movement at target joints, use of target muscles after mod to max verbal, visual, tactile cues.     Response to treatment Pt tolerated session well without aggravation of symptoms.     Clinical impression No significant differences in B leg circumference measurements for swelling, no abnormal warmth, redness and no pain with B calf squeeze. Due to similar circumference measurements (no obvious signs of swelling observed), physical therapy was continued based on referring provider's criteria. Pt returns today following hospitalization and brief stay at a SNF due to [place] on 04/22/2022. Pt demonstrates decreased activity tolerance, not being able to finish the 6 minute walk test, stopping after 4.5 minutes and being able to ambulate one third the distance pt walked compared to most recent measurement, increased fall risk as shown by decreased DGI balance score, and decreased LE functional strength based on increased 5 times sit <> stand time compared to most recent measurement. Pt demonstrates regression in progress and will benefit from  continued skilled physical therapy services to return to prior level of function, improve strength, balance, ability to ambulate safely and decrease fall risk.         PATIENT EDUCATION: Education details: ther-ex, HEP Person educated: Patient Education method: Explanation, Demonstration, Tactile cues, Verbal cues, and Handouts Education comprehension: verbalized understanding and returned demonstration   HOME EXERCISE PROGRAM: Access Code: [email or web address] URL: [email or web address] Date: 12/29/2021 Prepared by: Joneen Boers  Exercises - Standing Hip Abduction with Counter Support  - 1 x daily - 7 x weekly - 2 sets - 10 reps - 5 seconds hold - Sit to Stand with Counter Support  - 1 x daily - 7 x weekly - 2 sets - 5 reps      PT Short Term Goals -  05/03/22 1515       PT SHORT TERM GOAL #1   Title Patient will demonstrate independence with home exercise program for ability to self treat.    Baseline Has been doing his home exercises somewhat. No questions. (02/16/2022); 03/07/22: Performs every other day; Performing his HEP, no questions, seems to be doing him some good per pt (03/23/2022)    Time 2    Period Weeks    Status Achieved    Target Date 03/21/22      PT SHORT TERM GOAL #2   Title 6MWT c LRAD >873f -> (+) IADL;    Baseline 3073feval; 500 ft with rw, CGA, cues for increasing foot clearance and step length and staying closer to the rw (02/16/2022): 03/07/2022: 945' with RW; 312 ft with rw (05/03/2022)    Time 4    Period Weeks    Status On-going    Target Date 06/02/22      PT SHORT TERM GOAL #3   Title FOTO>10% increase    Baseline Eval: 48; 03/07/22: 46; 50 (03/23/2022)    Time 4    Period Weeks    Status On-going    Target Date 06/02/22      PT SHORT TERM GOAL #4   Title 5xSTS<13sec s UE chair + airex pad.    Baseline Eval 15sec; 11 seconds with cues for increased speed (02/16/2022); 20.6 seconds with B UE assist, no Air Ex pad (05/03/2022)    Time 6     Period Weeks    Status On-going    Target Date 06/02/22              PT Long Term Goals - 05/03/22 1531       PT LONG TERM GOAL #1   Title Patient will have improved function and activity level as evidenced by an increase in FOTO score by 13 points or more.    Baseline Eval 48; 03/07/22: 46; 50 (03/23/2022)    Time 8    Period Weeks    Status On-going    Target Date 06/30/22      PT LONG TERM GOAL #2   Title 6MWT >120049f LRAD    Baseline Eval: 300f27f 2m4312m945' with RW completing full 6 minutes; 312 ft with rw (05/03/2022)    Time 8    Period Weeks    Status On-going    Target Date 06/30/22      PT LONG TERM GOAL #3   Title Pt will improve his DGI score with rw to > 19 points as a demonstration of decreased fall risk.    Baseline DGI with rw: 15 (01/03/2022); 03/07/22: 16 relying on RW; 15, did not use RW today (03/23/2022); 9 with rw (05/03/2022)    Time 8    Period Weeks    Status On-going    Target Date 06/30/22      PT LONG TERM GOAL #4   Title 5xSTS<15sec from chair, s BUE    Baseline eval: requires arms from chair height; 03/07/22: 13.32 sec without UE support; 20.6 seconds wiht B UE assist (05/03/2022)    Time 10    Period Weeks    Status On-going    Target Date 06/30/22              Plan - 05/03/22 1647     Clinical Impression Statement No significant differences in B leg circumference measurements for swelling, no abnormal warmth, redness and no pain with B  calf squeeze. Due to similar circumference measurements (no obvious signs of swelling observed), physical therapy was continued based on referring provider's criteria. Pt returns today following hospitalization and brief stay at a SNF due to [place] on 04/22/2022. Pt demonstrates decreased activity tolerance, not being able to finish the 6 minute walk test, stopping after 4.5 minutes and being able to ambulate one third the distance pt walked compared to most recent measurement, increased fall risk as shown by  decreased DGI balance score, and decreased LE functional strength based on increased 5 times sit <> stand time compared to most recent measurement. Pt demonstrates regression in progress and will benefit from continued skilled physical therapy services to return to prior level of function, improve strength, balance, ability to ambulate safely and decrease fall risk.    Personal Factors and Comorbidities Age;Past/Current Experience    Comorbidities PD, H/o cancer,    Examination-Activity Limitations Squat;Stairs;Stand;Locomotion Level;Reach Overhead    Examination-Participation Restrictions Cleaning;Medication Management;Interpersonal Relationship;Community Activity    Stability/Clinical Decision Making Evolving/Moderate complexity   regression in PT progress from COVID   Clinical Decision Making Moderate    Rehab Potential Fair    PT Frequency 2x / week    PT Duration 8 weeks    PT Treatment/Interventions ADLs/Self Care Home Management;Neuromuscular re-education;Balance training;Therapeutic exercise;Orthotic Fit/Training;Prosthetic Training;Patient/family education;Wheelchair mobility training;Manual techniques;Passive range of motion;Energy conservation;Vestibular;Joint Manipulations;Gait training;Therapeutic activities;Functional mobility training;Stair training    PT Next Visit Plan Large movements, balance, functional strength, manual techniques, modalities PRN    PT Home Exercise Plan Medbridge Access Code: [email or web address]    Consulted and Agree with Plan of Care Patient                             Joneen Boers PT, DPT  05/03/2022, 5:21 PM

## 2022-05-03 NOTE — Telephone Encounter (Signed)
Called Dr. Olin Pia office for follow up on Pt ultrasound. Per Happy (receptionist), Dr. Caryl Comes said if pt has swelling in his leg, no to PT. If there is no swelling, then PT can resume.

## 2022-05-05 ENCOUNTER — Ambulatory Visit: Payer: Medicare HMO

## 2022-05-05 DIAGNOSIS — R262 Difficulty in walking, not elsewhere classified: Secondary | ICD-10-CM | POA: Diagnosis not present

## 2022-05-05 DIAGNOSIS — R42 Dizziness and giddiness: Secondary | ICD-10-CM

## 2022-05-05 DIAGNOSIS — R293 Abnormal posture: Secondary | ICD-10-CM

## 2022-05-05 NOTE — Therapy (Signed)
OUTPATIENT PHYSICAL THERAPY TREATMENT NOTE   Patient Name: Wesley Anderson. MRN: [id number] DOB:1946/06/29, 76 y.o., male Today's Date: 05/05/2022  PCP: Ramonita Lab III, MD REFERRING PROVIDER: Ramonita Lab III, MD   PT End of Session - 05/05/22 1505     Visit Number 22    Number of Visits 44    Date for PT Re-Evaluation 06/30/22    Authorization Type 2    Authorization Time Period 10 progress report    PT Start Time 1505    PT Stop Time 1545    PT Time Calculation (min) 40 min    Equipment Utilized During Treatment Gait belt   RW   Activity Tolerance Patient tolerated treatment well    Behavior During Therapy WFL for tasks assessed/performed                           Past Medical History:  Diagnosis Date   AAA (abdominal aortic aneurysm) ([place])    Alcohol dependence in remission ([place])    Cancer ([place])    skin    Colon polyps    ED (erectile dysfunction)    GERD (gastroesophageal reflux disease)    Hematest positive stools    Melanoma of skin (HCC)    Parkinson's disease ([place])    Renal cyst, right    Restless leg syndrome    Past Surgical History:  Procedure Laterality Date   CHOLECYSTECTOMY     COLON SURGERY     colectomy with anastamosis   COLONOSCOPY WITH PROPOFOL N/A 11/07/2016   Procedure: COLONOSCOPY WITH PROPOFOL;  Surgeon: Lollie Sails, MD;  Location: [facility] ENDOSCOPY;  Service: Endoscopy;  Laterality: N/A;   COLONOSCOPY WITH PROPOFOL N/A 09/06/2018   Procedure: COLONOSCOPY WITH PROPOFOL;  Surgeon: Lollie Sails, MD;  Location: [facility] ENDOSCOPY;  Service: Endoscopy;  Laterality: N/A;   COLONOSCOPY WITH PROPOFOL N/A 05/16/2019   Procedure: COLONOSCOPY WITH PROPOFOL;  Surgeon: Lollie Sails, MD;  Location: [facility] ENDOSCOPY;  Service: Endoscopy;  Laterality: N/A;   CYST EXCISION     vocal cord   DBS placement     deep brain implant     ESOPHAGOGASTRODUODENOSCOPY (EGD) WITH PROPOFOL N/A 09/06/2018   Procedure:  ESOPHAGOGASTRODUODENOSCOPY (EGD) WITH PROPOFOL;  Surgeon: Lollie Sails, MD;  Location: [facility] ENDOSCOPY;  Service: Endoscopy;  Laterality: N/A;   MELANOMA EXCISION     TONSILLECTOMY     Patient Active Problem List   Diagnosis Date Noted   Chronic systolic (congestive) heart failure ([place]) 04/28/2022   COVID-19 virus infection 04/22/2022   Depression 04/22/2022   Acute gastroenteritis 08/08/2021   Sepsis ([place]) 08/08/2021   Confusion    Anemia 07/09/2021   Weakness 07/09/2021   Fever and chills 07/09/2021   Myalgia 07/09/2021   Influenza A 32/44/0102   Acute metabolic encephalopathy 72/53/6644   AKI (acute kidney injury) ([place]) 07/09/2020   Leukocytosis 07/09/2020   S/P deep brain stimulator placement 07/09/2020   Squamous cell carcinoma, 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 04/24/2020   Heme positive stool 05/27/2016   Alcohol dependence in remission ([place]) 07/31/2013   Cognitive impairment 02/22/2013   History of malignant melanoma of skin 05/15/2012   Parkinson's disease ([place]) 09/28/2011    REFERRING DIAG: Unsteadiness on feet, history of falls  THERAPY DIAG:  Difficulty in walking, not elsewhere classified  Abnormal posture  Dizziness and giddiness  PERTINENT HISTORY: Wesley Anderson is a 69yoM who is referred to [facility] OPPT for on-going imbalance  in the setting of Parkinson's Disease. Pt was an active patient here in Nov 2022, but DC due to hospital admission for Flu in November and another due to encephalopathy in December. Pt working with HHPT/OT in the interim. Pt reports no falls in the past several months, had made efforts to be as safe as possible  PRECAUTIONS: Fall risk  SUBJECTIVE: Doing ok, no pain.     PAIN:  Are you having pain? 0/10     TODAY'S TREATMENT:  Neuromuscular re-education  CGA to SBA with standing tasks  Sitting with B feet propped on stool x 2 minutes, legs straight to stretch hamstring muscles to improve knee extension for gait.    Gait around gym with rw, emphasis on improving step length and hip and knee flexion. 300 ft. Improved step length and foot clearance observed during straight path and turning compared to previous session. Decreased festination after cues.    Standing with B feet spread apart     B shoulder horizontal abduction with B scapular retraction 10x3 with 5 second holds                  B shoulder horizontal abduction with B scapular retraction with R and L trunk rotation with arms in abduction 10x3                 To promote large movements.   Standing with B UE assist   Hip abduction with emphasis on large movements  R 10x3  L 10x3   SLS    R 10x5 seconds for 2 sets   L 10x5 seconds for 2 sets    Seated B scapular retraction 10x5 seconds   Demonstrates difficulty with mechanics          Cues for large movements Improved exercise technique, movement at target joints, use of target muscles after mod verbal, visual, tactile cues.     Response to treatment Pt tolerated session well without aggravation of symptoms.     Clinical impression  Improved step length and foot clearance when ambulating with rw after cues. Worked on large movements and upright posture to increase base of support and balance. Pt tolerated session well without aggravation of symptoms. Pt will benefit from continued skilled physical therapy services to improve the aforementioned deficits.        PATIENT EDUCATION: Education details: ther-ex, HEP Person educated: Patient Education method: Explanation, Demonstration, Tactile cues, Verbal cues, and Handouts Education comprehension: verbalized understanding and returned demonstration   HOME EXERCISE PROGRAM: Access Code: [email or web address] URL: [email or web address] Date: 12/29/2021 Prepared by: Joneen Boers  Exercises - Standing Hip Abduction with Counter Support  - 1 x daily - 7 x weekly - 2 sets - 10 reps - 5 seconds hold - Sit to  Stand with Counter Support  - 1 x daily - 7 x weekly - 2 sets - 5 reps      PT Short Term Goals - 05/03/22 1515       PT SHORT TERM GOAL #1   Title Patient will demonstrate independence with home exercise program for ability to self treat.    Baseline Has been doing his home exercises somewhat. No questions. (02/16/2022); 03/07/22: Performs every other day; Performing his HEP, no questions, seems to be doing him some good per pt (03/23/2022)    Time 2    Period Weeks    Status Achieved    Target Date 03/21/22      PT SHORT TERM GOAL #2  Title 6MWT c LRAD >817f -> (+) IADL;    Baseline 3054feval; 500 ft with rw, CGA, cues for increasing foot clearance and step length and staying closer to the rw (02/16/2022): 03/07/2022: 945' with RW; 312 ft with rw (05/03/2022)    Time 4    Period Weeks    Status On-going    Target Date 06/02/22      PT SHORT TERM GOAL #3   Title FOTO>10% increase    Baseline Eval: 48; 03/07/22: 46; 50 (03/23/2022)    Time 4    Period Weeks    Status On-going    Target Date 06/02/22      PT SHORT TERM GOAL #4   Title 5xSTS<13sec s UE chair + airex pad.    Baseline Eval 15sec; 11 seconds with cues for increased speed (02/16/2022); 20.6 seconds with B UE assist, no Air Ex pad (05/03/2022)    Time 6    Period Weeks    Status On-going    Target Date 06/02/22              PT Long Term Goals - 05/03/22 1531       PT LONG TERM GOAL #1   Title Patient will have improved function and activity level as evidenced by an increase in FOTO score by 13 points or more.    Baseline Eval 48; 03/07/22: 46; 50 (03/23/2022)    Time 8    Period Weeks    Status On-going    Target Date 06/30/22      PT LONG TERM GOAL #2   Title 6MWT >120048f LRAD    Baseline Eval: 300f56f 2m431m945' with RW completing full 6 minutes; 312 ft with rw (05/03/2022)    Time 8    Period Weeks    Status On-going    Target Date 06/30/22      PT LONG TERM GOAL #3   Title Pt will improve his DGI  score with rw to > 19 points as a demonstration of decreased fall risk.    Baseline DGI with rw: 15 (01/03/2022); 03/07/22: 16 relying on RW; 15, did not use RW today (03/23/2022); 9 with rw (05/03/2022)    Time 8    Period Weeks    Status On-going    Target Date 06/30/22      PT LONG TERM GOAL #4   Title 5xSTS<15sec from chair, s BUE    Baseline eval: requires arms from chair height; 03/07/22: 13.32 sec without UE support; 20.6 seconds wiht B UE assist (05/03/2022)    Time 10    Period Weeks    Status On-going    Target Date 06/30/22              Plan - 05/05/22 1533     Clinical Impression Statement Improved step length and foot clearance when ambulating with rw after cues. Worked on large movements and upright posture to increase base of support and balance. Pt tolerated session well without aggravation of symptoms. Pt will benefit from continued skilled physical therapy services to improve the aforementioned deficits.    Personal Factors and Comorbidities Age;Past/Current Experience    Comorbidities PD, H/o cancer,    Examination-Activity Limitations Squat;Stairs;Stand;Locomotion Level;Reach Overhead    Examination-Participation Restrictions Cleaning;Medication Management;Interpersonal Relationship;Community Activity    Stability/Clinical Decision Making Evolving/Moderate complexity   regression in [place] Frequency 2x / week    PT Duration  8 weeks    PT Treatment/Interventions ADLs/Self Care Home Management;Neuromuscular re-education;Balance training;Therapeutic exercise;Orthotic Fit/Training;Prosthetic Training;Patient/family education;Wheelchair mobility training;Manual techniques;Passive range of motion;Energy conservation;Vestibular;Joint Manipulations;Gait training;Therapeutic activities;Functional mobility training;Stair training    PT Next Visit Plan Large movements, balance, functional strength, manual techniques, modalities PRN     PT Home Exercise Plan Medbridge Access Code: [email or web address]    Consulted and Agree with Plan of Care Patient                          Joneen Boers PT, DPT  05/05/2022, 3:58 PM

## 2022-05-06 NOTE — Progress Notes (Signed)
Respiratory pathogen test was ordered by myself on September 8 due to concerns of suspected viral syndrome.

## 2022-05-10 ENCOUNTER — Ambulatory Visit: Payer: Medicare HMO

## 2022-05-12 ENCOUNTER — Ambulatory Visit: Payer: Medicare HMO

## 2022-05-12 DIAGNOSIS — R293 Abnormal posture: Secondary | ICD-10-CM

## 2022-05-12 DIAGNOSIS — R262 Difficulty in walking, not elsewhere classified: Secondary | ICD-10-CM | POA: Diagnosis not present

## 2022-05-12 NOTE — Therapy (Signed)
OUTPATIENT PHYSICAL THERAPY TREATMENT NOTE   Patient Name: Wesley Anderson. MRN: [id number] DOB:[birth year], 76 y.o., male Today's Date: 05/12/2022  PCP: Ramonita Lab III, MD REFERRING PROVIDER: Ramonita Lab III, MD   PT End of Session - 05/12/22 1509     Visit Number 23    Number of Visits 92    Date for PT Re-Evaluation 06/30/22    Authorization Type 3    Authorization Time Period 10 progress report    PT Start Time 1509   pt arrived late   PT Stop Time 1540    PT Time Calculation (min) 31 min    Equipment Utilized During Treatment Gait belt   RW   Activity Tolerance Patient tolerated treatment well    Behavior During Therapy WFL for tasks assessed/performed                            Past Medical History:  Diagnosis Date   AAA (abdominal aortic aneurysm) ([place])    Alcohol dependence in remission ([place])    Cancer ([place])    skin    Colon polyps    ED (erectile dysfunction)    GERD (gastroesophageal reflux disease)    Hematest positive stools    Melanoma of skin ([facility])    Parkinson's disease ([place])    Renal cyst, right    Restless leg syndrome    Past Surgical History:  Procedure Laterality Date   CHOLECYSTECTOMY     COLON SURGERY     colectomy with anastamosis   COLONOSCOPY WITH PROPOFOL N/A 11/07/2016   Procedure: COLONOSCOPY WITH PROPOFOL;  Surgeon: Lollie Sails, MD;  Location: [facility] ENDOSCOPY;  Service: Endoscopy;  Laterality: N/A;   COLONOSCOPY WITH PROPOFOL N/A 09/06/2018   Procedure: COLONOSCOPY WITH PROPOFOL;  Surgeon: Lollie Sails, MD;  Location: [facility] ENDOSCOPY;  Service: Endoscopy;  Laterality: N/A;   COLONOSCOPY WITH PROPOFOL N/A 05/16/2019   Procedure: COLONOSCOPY WITH PROPOFOL;  Surgeon: Lollie Sails, MD;  Location: [facility] ENDOSCOPY;  Service: Endoscopy;  Laterality: N/A;   CYST EXCISION     vocal cord   DBS placement     deep brain implant     ESOPHAGOGASTRODUODENOSCOPY (EGD) WITH PROPOFOL N/A 09/06/2018   Procedure:  ESOPHAGOGASTRODUODENOSCOPY (EGD) WITH PROPOFOL;  Surgeon: Lollie Sails, MD;  Location: [facility] ENDOSCOPY;  Service: Endoscopy;  Laterality: N/A;   MELANOMA EXCISION     TONSILLECTOMY     Patient Active Problem List   Diagnosis Date Noted   Chronic systolic (congestive) heart failure ([place]) 04/28/2022   COVID-19 virus infection 04/22/2022   Depression 04/22/2022   Acute gastroenteritis 08/08/2021   Sepsis ([place]) 08/08/2021   Confusion    Anemia 07/09/2021   Weakness 07/09/2021   Fever and chills 07/09/2021   Myalgia 07/09/2021   Influenza A 15/94/5859   Acute metabolic encephalopathy 29/24/4628   AKI (acute kidney injury) ([place]) 07/09/2020   Leukocytosis 07/09/2020   S/P deep brain stimulator placement 07/09/2020   Squamous cell carcinoma, 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 04/24/2020   Heme positive stool 05/27/2016   Alcohol dependence in remission ([place]) 07/31/2013   Cognitive impairment 02/22/2013   History of malignant melanoma of skin 05/15/2012   Parkinson's disease ([place]) 09/28/2011    REFERRING DIAG: Unsteadiness on feet, history of falls  THERAPY DIAG:  Difficulty in walking, not elsewhere classified  Abnormal posture  PERTINENT HISTORY: [name] is a 67yoM who is referred to [facility] for on-going  imbalance in the setting of Parkinson's Disease. Pt was an active patient here in Nov 2022, but DC due to hospital admission for Flu in November and another due to encephalopathy in December. Pt working with HHPT/OT in the interim. Pt reports no falls in the past several months, had made efforts to be as safe as possible  PRECAUTIONS: Fall risk  SUBJECTIVE: Got a UTI. Burns when he pees.    PAIN:  Are you having pain? 0/10     TODAY'S TREATMENT:  Neuromuscular re-education  CGA to SBA with standing tasks  Sitting with B feet propped on stool x 2 minutes, legs straight to stretch hamstring muscles to improve knee extension for gait.    Standing with B  UE assist   Hip abduction with emphasis on large movements  R 10x3  L 10x3   Standing static forward lunge with contralateral UE assist   R 10x  L 10x  Standing static lateral lunge with B UE assist   R 10x  L 10x    Sit <> stand 5x without UE assist  Standing with B UE assist   SLS    R 10x5 seconds for 2 sets   L 10x5 seconds for 2 sets   Stepping over 2 mini hurdles with one UE assist on bar 10x2   .  Cues for large movements Improved exercise technique, movement at target joints, use of target muscles after mod verbal, visual, tactile cues.     Response to treatment Pt tolerated session well without aggravation of symptoms.     Clinical impression   Pt arrived late so session was adjusted accordingly. Worked on large movements as well as general LE strengthening and obstacle negotiation to decrease fall risk. Decreased festinating gait pattern after verbal cues. Pt tolerated session well without aggravation of symptoms. Pt will benefit from continued skilled physical therapy services to improve the aforementioned deficits.        PATIENT EDUCATION: Education details: ther-ex, HEP Person educated: Patient Education method: Explanation, Demonstration, Tactile cues, Verbal cues, and Handouts Education comprehension: verbalized understanding and returned demonstration   HOME EXERCISE PROGRAM: Access Code: [email or web address] URL: [email or web address] Date: 12/29/2021 Prepared by: Joneen Boers  Exercises - Standing Hip Abduction with Counter Support  - 1 x daily - 7 x weekly - 2 sets - 10 reps - 5 seconds hold - Sit to Stand with Counter Support  - 1 x daily - 7 x weekly - 2 sets - 5 reps      PT Short Term Goals - 05/03/22 1515       PT SHORT TERM GOAL #1   Title Patient will demonstrate independence with home exercise program for ability to self treat.    Baseline Has been doing his home exercises somewhat. No questions. (02/16/2022);  03/07/22: Performs every other day; Performing his HEP, no questions, seems to be doing him some good per pt (03/23/2022)    Time 2    Period Weeks    Status Achieved    Target Date 03/21/22      PT SHORT TERM GOAL #2   Title 6MWT c LRAD >841f -> (+) IADL;    Baseline 3019feval; 500 ft with rw, CGA, cues for increasing foot clearance and step length and staying closer to the rw (02/16/2022): 03/07/2022: 945' with RW; 312 ft with rw (05/03/2022)    Time 4    Period Weeks    Status On-going    Target Date 06/02/22  PT SHORT TERM GOAL #3   Title FOTO>10% increase    Baseline Eval: 48; 03/07/22: 46; 50 (03/23/2022)    Time 4    Period Weeks    Status On-going    Target Date 06/02/22      PT SHORT TERM GOAL #4   Title 5xSTS<13sec s UE chair + airex pad.    Baseline Eval 15sec; 11 seconds with cues for increased speed (02/16/2022); 20.6 seconds with B UE assist, no Air Ex pad (05/03/2022)    Time 6    Period Weeks    Status On-going    Target Date 06/02/22              PT Long Term Goals - 05/03/22 1531       PT LONG TERM GOAL #1   Title Patient will have improved function and activity level as evidenced by an increase in FOTO score by 13 points or more.    Baseline Eval 48; 03/07/22: 46; 50 (03/23/2022)    Time 8    Period Weeks    Status On-going    Target Date 06/30/22      PT LONG TERM GOAL #2   Title 6MWT >1270f c LRAD    Baseline Eval: 3069fin 75m775m; 945' with RW completing full 6 minutes; 312 ft with rw (05/03/2022)    Time 8    Period Weeks    Status On-going    Target Date 06/30/22      PT LONG TERM GOAL #3   Title Pt will improve his DGI score with rw to > 19 points as a demonstration of decreased fall risk.    Baseline DGI with rw: 15 (01/03/2022); 03/07/22: 16 relying on RW; 15, did not use RW today (03/23/2022); 9 with rw (05/03/2022)    Time 8    Period Weeks    Status On-going    Target Date 06/30/22      PT LONG TERM GOAL #4   Title 5xSTS<15sec from chair,  s BUE    Baseline eval: requires arms from chair height; 03/07/22: 13.32 sec without UE support; 20.6 seconds wiht B UE assist (05/03/2022)    Time 10    Period Weeks    Status On-going    Target Date 06/30/22              Plan - 05/12/22 1535     Clinical Impression Statement Pt arrived late so session was adjusted accordingly. Worked on large movements as well as general LE strengthening and obstacle negotiation to decrease fall risk. Decreased festinating gait pattern after verbal cues. Pt tolerated session well without aggravation of symptoms. Pt will benefit from continued skilled physical therapy services to improve the aforementioned deficits.    Personal Factors and Comorbidities Age;Past/Current Experience    Comorbidities PD, H/o cancer,    Examination-Activity Limitations Squat;Stairs;Stand;Locomotion Level;Reach Overhead    Examination-Participation Restrictions Cleaning;Medication Management;Interpersonal Relationship;Community Activity    Stability/Clinical Decision Making --   regression in PT progress from COVID   Rehab Potential Fair    PT Frequency 2x / week    PT Duration 8 weeks    PT Treatment/Interventions ADLs/Self Care Home Management;Neuromuscular re-education;Balance training;Therapeutic exercise;Orthotic Fit/Training;Prosthetic Training;Patient/family education;Wheelchair mobility training;Manual techniques;Passive range of motion;Energy conservation;Vestibular;Joint Manipulations;Gait training;Therapeutic activities;Functional mobility training;Stair training    PT Next Visit Plan Large movements, balance, functional strength, manual techniques, modalities PRN    PT Home Exercise Plan Medbridge Access Code: [email or web address] Consulted and Agree with  Plan of Care Patient                           Joneen Boers PT, DPT  05/12/2022, 8:43 PM

## 2022-05-17 ENCOUNTER — Ambulatory Visit: Payer: Medicare HMO | Attending: Internal Medicine

## 2022-05-17 DIAGNOSIS — R293 Abnormal posture: Secondary | ICD-10-CM | POA: Insufficient documentation

## 2022-05-17 DIAGNOSIS — R262 Difficulty in walking, not elsewhere classified: Secondary | ICD-10-CM | POA: Diagnosis not present

## 2022-05-17 NOTE — Therapy (Signed)
OUTPATIENT PHYSICAL THERAPY TREATMENT NOTE   Patient Name: Wesley Anderson. MRN: [id number] DOB:04/17/1946, 76 y.o., male Today's Date: 05/17/2022  PCP: Wesley Lab III, MD REFERRING PROVIDER: Ramonita Lab III, MD   PT End of Session - 05/17/22 1504     Visit Number 24    Number of Visits 1    Date for PT Re-Evaluation 06/30/22    Authorization Type 4    Authorization Time Period 10 progress report    PT Start Time 1504    PT Stop Time 3646    PT Time Calculation (min) 38 min    Equipment Utilized During Treatment Gait belt   RW   Activity Tolerance Patient tolerated treatment well    Behavior During Therapy WFL for tasks assessed/performed                             Past Medical History:  Diagnosis Date   AAA (abdominal aortic aneurysm) ([place])    Alcohol dependence in remission ([place])    Cancer ([place])    skin    Colon polyps    ED (erectile dysfunction)    GERD (gastroesophageal reflux disease)    Hematest positive stools    Melanoma of skin ([facility])    Parkinson's disease ([place])    Renal cyst, right    Restless leg syndrome    Past Surgical History:  Procedure Laterality Date   CHOLECYSTECTOMY     COLON SURGERY     colectomy with anastamosis   COLONOSCOPY WITH PROPOFOL N/A 11/07/2016   Procedure: COLONOSCOPY WITH PROPOFOL;  Surgeon: Lollie Sails, MD;  Location: [facility] ENDOSCOPY;  Service: Endoscopy;  Laterality: N/A;   COLONOSCOPY WITH PROPOFOL N/A 09/06/2018   Procedure: COLONOSCOPY WITH PROPOFOL;  Surgeon: Lollie Sails, MD;  Location: [place] ENDOSCOPY;  Service: Endoscopy;  Laterality: N/A;   COLONOSCOPY WITH PROPOFOL N/A 05/16/2019   Procedure: COLONOSCOPY WITH PROPOFOL;  Surgeon: Lollie Sails, MD;  Location: [facility] ENDOSCOPY;  Service: Endoscopy;  Laterality: N/A;   CYST EXCISION     vocal cord   DBS placement     deep brain implant     ESOPHAGOGASTRODUODENOSCOPY (EGD) WITH PROPOFOL N/A 09/06/2018   Procedure:  ESOPHAGOGASTRODUODENOSCOPY (EGD) WITH PROPOFOL;  Surgeon: Lollie Sails, MD;  Location: [facility] ENDOSCOPY;  Service: Endoscopy;  Laterality: N/A;   MELANOMA EXCISION     TONSILLECTOMY     Patient Active Problem List   Diagnosis Date Noted   Chronic systolic (congestive) heart failure ([place]) 04/28/2022   COVID-19 virus infection 04/22/2022   Depression 04/22/2022   Acute gastroenteritis 08/08/2021   Sepsis ([place]) 08/08/2021   Confusion    Anemia 07/09/2021   Weakness 07/09/2021   Fever and chills 07/09/2021   Myalgia 07/09/2021   Influenza A 80/32/1224   Acute metabolic encephalopathy 82/50/0370   AKI (acute kidney injury) ([place]) 07/09/2020   Leukocytosis 07/09/2020   S/P deep brain stimulator placement 07/09/2020   Squamous cell carcinoma, 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 04/24/2020   Heme positive stool 05/27/2016   Alcohol dependence in remission ([place]) 07/31/2013   Cognitive impairment 02/22/2013   History of malignant melanoma of skin 05/15/2012   Parkinson's disease 09/28/2011    REFERRING DIAG: Unsteadiness on feet, history of falls  THERAPY DIAG:  Difficulty in walking, not elsewhere classified  Abnormal posture  PERTINENT HISTORY: Wesley Anderson is a 58yoM who is referred to [facility] for on-going imbalance in the setting  of Parkinson's Disease. Pt was an active patient here in Nov 2022, but DC due to hospital admission for Flu in November and another due to encephalopathy in December. Pt working with HHPT/OT in the interim. Pt reports no falls in the past several months, had made efforts to be as safe as possible  PRECAUTIONS: Fall risk  SUBJECTIVE: No pain, just the brain. UTI is still there but a lot better.    PAIN:  Are you having pain? 0/10     TODAY'S TREATMENT:  Neuromuscular re-education  CGA to SBA with standing tasks  Gait with rw 760 ft, cues for increasing foot clearance and step length.   Sit <> stand with B UE to no UE assist 5x (able to  perform 4x without UE assist)  Then 5x without UE assist   Sitting with B feet propped on stool x 2 minutes, legs straight to stretch hamstring muscles to improve knee extension for gait.   Standing with B UE assist   Hip abduction with emphasis on large movements  R 10x3  L 10x3  Standing static forward lunge with contralateral UE assist   R 5x  L 5x  Standing static lateral lunge with B UE assist   R 10x  L 10x  Stepping over 2 mini hurdles with one UE assist on bar 10x2  Standing with B UE assist   SLS    R 10x5 seconds for 2 sets   L 10x5 seconds for 2 sets   Standing with B feet spread apart                    B shoulder horizontal abduction with B scapular retraction 10x with 5 second holds                   To promote large movements.       .  Cues for large movements Improved exercise technique, movement at target joints, use of target muscles after mod verbal, visual, tactile cues.     Response to treatment Pt tolerated session well without aggravation of symptoms.     Clinical impression Worked on large movements as well as general LE strengthening and obstacle negotiation to decrease fall risk. Decreased festinating gait pattern after verbal cues. Pt tolerated session well without aggravation of symptoms. Pt will benefit from continued skilled physical therapy services to improve the aforementioned deficits.        PATIENT EDUCATION: Education details: ther-ex, HEP Person educated: Patient Education method: Explanation, Demonstration, Tactile cues, Verbal cues, and Handouts Education comprehension: verbalized understanding and returned demonstration   HOME EXERCISE PROGRAM: Access Code: [email or web address] URL: [email or web address] Date: 12/29/2021 Prepared by: Joneen Boers  Exercises - Standing Hip Abduction with Counter Support  - 1 x daily - 7 x weekly - 2 sets - 10 reps - 5 seconds hold - Sit to Stand with Counter Support  - 1  x daily - 7 x weekly - 2 sets - 5 reps      PT Short Term Goals - 05/03/22 1515       PT SHORT TERM GOAL #1   Title Patient will demonstrate independence with home exercise program for ability to self treat.    Baseline Has been doing his home exercises somewhat. No questions. (02/16/2022); 03/07/22: Performs every other day; Performing his HEP, no questions, seems to be doing him some good per pt (03/23/2022)    Time 2    Period Weeks  Status Achieved    Target Date 03/21/22      PT SHORT TERM GOAL #2   Title 6MWT c LRAD >858f -> (+) IADL;    Baseline 3092feval; 500 ft with rw, CGA, cues for increasing foot clearance and step length and staying closer to the rw (02/16/2022): 03/07/2022: 945' with RW; 312 ft with rw (05/03/2022)    Time 4    Period Weeks    Status On-going    Target Date 06/02/22      PT SHORT TERM GOAL #3   Title FOTO>10% increase    Baseline Eval: 48; 03/07/22: 46; 50 (03/23/2022)    Time 4    Period Weeks    Status On-going    Target Date 06/02/22      PT SHORT TERM GOAL #4   Title 5xSTS<13sec s UE chair + airex pad.    Baseline Eval 15sec; 11 seconds with cues for increased speed (02/16/2022); 20.6 seconds with B UE assist, no Air Ex pad (05/03/2022)    Time 6    Period Weeks    Status On-going    Target Date 06/02/22              PT Long Term Goals - 05/03/22 1531       PT LONG TERM GOAL #1   Title Patient will have improved function and activity level as evidenced by an increase in FOTO score by 13 points or more.    Baseline Eval 48; 03/07/22: 46; 50 (03/23/2022)    Time 8    Period Weeks    Status On-going    Target Date 06/30/22      PT LONG TERM GOAL #2   Title 6MWT >120023f LRAD    Baseline Eval: 300f65f 2m4330m945' with RW completing full 6 minutes; 312 ft with rw (05/03/2022)    Time 8    Period Weeks    Status On-going    Target Date 06/30/22      PT LONG TERM GOAL #3   Title Pt will improve his DGI score with rw to > 19 points as a  demonstration of decreased fall risk.    Baseline DGI with rw: 15 (01/03/2022); 03/07/22: 16 relying on RW; 15, did not use RW today (03/23/2022); 9 with rw (05/03/2022)    Time 8    Period Weeks    Status On-going    Target Date 06/30/22      PT LONG TERM GOAL #4   Title 5xSTS<15sec from chair, s BUE    Baseline eval: requires arms from chair height; 03/07/22: 13.32 sec without UE support; 20.6 seconds wiht B UE assist (05/03/2022)    Time 10    Period Weeks    Status On-going    Target Date 06/30/22              Plan - 05/17/22 1504     Clinical Impression Statement Worked on large movements as well as general LE strengthening and obstacle negotiation to decrease fall risk. Decreased festinating gait pattern after verbal cues. Pt tolerated session well without aggravation of symptoms. Pt will benefit from continued skilled physical therapy services to improve the aforementioned deficits.    Personal Factors and Comorbidities Age;Past/Current Experience    Comorbidities PD, H/o cancer,    Examination-Activity Limitations Squat;Stairs;Stand;Locomotion Level;Reach Overhead    Examination-Participation Restrictions Cleaning;Medication Management;Interpersonal Relationship;Community Activity    Stability/Clinical Decision Making --   regression in PT progress from COVID  Rehab Potential Fair    PT Frequency 2x / week    PT Duration 8 weeks    PT Treatment/Interventions ADLs/Self Care Home Management;Neuromuscular re-education;Balance training;Therapeutic exercise;Orthotic Fit/Training;Prosthetic Training;Patient/family education;Wheelchair mobility training;Manual techniques;Passive range of motion;Energy conservation;Vestibular;Joint Manipulations;Gait training;Therapeutic activities;Functional mobility training;Stair training    PT Next Visit Plan Large movements, balance, functional strength, manual techniques, modalities PRN    PT Home Exercise Plan Medbridge Access Code: [email or web address]     Consulted and Agree with Plan of Care Patient                            Joneen Boers PT, DPT  05/17/2022, 3:44 PM

## 2022-05-19 ENCOUNTER — Ambulatory Visit: Payer: Medicare HMO

## 2022-05-19 DIAGNOSIS — R262 Difficulty in walking, not elsewhere classified: Secondary | ICD-10-CM | POA: Diagnosis not present

## 2022-05-19 DIAGNOSIS — R293 Abnormal posture: Secondary | ICD-10-CM

## 2022-05-19 NOTE — Therapy (Signed)
OUTPATIENT PHYSICAL THERAPY TREATMENT NOTE   Patient Name: Wesley Anderson. MRN: [id number] DOB:01/23/1946, 76 y.o., male Today's Date: 05/19/2022  PCP: Ramonita Lab III, MD REFERRING PROVIDER: Ramonita Lab III, MD   PT End of Session - 05/19/22 1425     Visit Number 25    Number of Visits 75    Date for PT Re-Evaluation 06/30/22    Authorization Type 5    Authorization Time Period 10 progress report    PT Start Time 1426    PT Stop Time 1506    PT Time Calculation (min) 40 min    Equipment Utilized During Treatment Gait belt   RW   Activity Tolerance Patient tolerated treatment well    Behavior During Therapy WFL for tasks assessed/performed                              Past Medical History:  Diagnosis Date   AAA (abdominal aortic aneurysm) ([place])    Alcohol dependence in remission ([place])    Cancer ([place])    skin    Colon polyps    ED (erectile dysfunction)    GERD (gastroesophageal reflux disease)    Hematest positive stools    Melanoma of skin (H[facility])    Parkinson's disease ([place])    Renal cyst, right    Restless leg syndrome    Past Surgical History:  Procedure Laterality Date   CHOLECYSTECTOMY     COLON SURGERY     colectomy with anastamosis   COLONOSCOPY WITH PROPOFOL N/A 11/07/2016   Procedure: COLONOSCOPY WITH PROPOFOL;  Surgeon: Lollie Sails, MD;  Location: [facility] ENDOSCOPY;  Service: Endoscopy;  Laterality: N/A;   COLONOSCOPY WITH PROPOFOL N/A 09/06/2018   Procedure: COLONOSCOPY WITH PROPOFOL;  Surgeon: Lollie Sails, MD;  Location: [facility] ENDOSCOPY;  Service: Endoscopy;  Laterality: N/A;   COLONOSCOPY WITH PROPOFOL N/A 05/16/2019   Procedure: COLONOSCOPY WITH PROPOFOL;  Surgeon: Lollie Sails, MD;  Location: [facility] ENDOSCOPY;  Service: Endoscopy;  Laterality: N/A;   CYST EXCISION     vocal cord   DBS placement     deep brain implant     ESOPHAGOGASTRODUODENOSCOPY (EGD) WITH PROPOFOL N/A 09/06/2018   Procedure:  ESOPHAGOGASTRODUODENOSCOPY (EGD) WITH PROPOFOL;  Surgeon: Lollie Sails, MD;  Location: [facility] ENDOSCOPY;  Service: Endoscopy;  Laterality: N/A;   MELANOMA EXCISION     TONSILLECTOMY     Patient Active Problem List   Diagnosis Date Noted   Chronic systolic (congestive) heart failure ([place]) 04/28/2022   COVID-19 virus infection 04/22/2022   Depression 04/22/2022   Acute gastroenteritis 08/08/2021   Sepsis ([place]) 08/08/2021   Confusion    Anemia 07/09/2021   Weakness 07/09/2021   Fever and chills 07/09/2021   Myalgia 07/09/2021   Influenza A 42/70/6237   Acute metabolic encephalopathy 62/83/1517   AKI (acute kidney injury) ([place]) 07/09/2020   Leukocytosis 07/09/2020   S/P deep brain stimulator placement 07/09/2020   Squamous cell carcinoma, 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 04/24/2020   Heme positive stool 05/27/2016   Alcohol dependence in remission ([place]) 07/31/2013   Cognitive impairment 02/22/2013   History of malignant melanoma of skin 05/15/2012   Parkinson's disease 09/28/2011    REFERRING DIAG: Unsteadiness on feet, history of falls  THERAPY DIAG:  Difficulty in walking, not elsewhere classified  Abnormal posture  PERTINENT HISTORY: Wesley Anderson is a 26yoM who is referred to [place] for on-going imbalance in the  setting of Parkinson's Disease. Pt was an active patient here in Nov 2022, but DC due to hospital admission for Flu in November and another due to encephalopathy in December. Pt working with HHPT/OT in the interim. Pt reports no falls in the past several months, had made efforts to be as safe as possible  PRECAUTIONS: Fall risk  SUBJECTIVE: Doing ok.     PAIN:  Are you having pain? 0/10     TODAY'S TREATMENT:  Neuromuscular re-education  (-) Homan's sign R LE and L LE   CGA to SBA with standing tasks  Gait with rw 700 ft, cues for increasing foot clearance and step length.   Sitting with B feet propped on stool x 2 minutes, legs straight  to stretch hamstring muscles to improve knee extension for gait.   Gait without AD 30 ft CGA. Improved step length and foot clearance, no cues needed.   Turning 360 degrees CGA, cues for increasing amplitude of movement. No UE assist  Clockwise 3x2  Counter clockwise 3x2   Standing static large forward lunge with contralateral UE assist   R 10x  L 10x   Then with large forward then large backward lunge   R 10x   L 10x  Standing static large lateral lunge with B UE assist   R 10x  L 10x  Side stepping without UE assist 5 ft to the R and 5 ft to the L 5x2.    Gait without UE assist, CGA 50 ft, min cues for increasing hip and knee flexion for foot clearance.     Cues for large movements Improved exercise technique, movement at target joints, use of target muscles after mod verbal, visual, tactile cues.     Response to treatment Pt tolerated session well without aggravation of symptoms.     Clinical impression  Improving foot clearance, step length, and steadiness with forward gait as well as turns in the clinic without using AD today. Still needs cues for increasing amplitude of movement. Pt tolerated session well without aggravation of symptoms. Pt will benefit from continued skilled physical therapy services to improve the aforementioned deficits.        PATIENT EDUCATION: Education details: ther-ex, HEP Person educated: Patient Education method: Explanation, Demonstration, Tactile cues, Verbal cues, and Handouts Education comprehension: verbalized understanding and returned demonstration   HOME EXERCISE PROGRAM: Access Code: [email or web address] URL: [email or web address] Date: 12/29/2021 Prepared by: Joneen Boers  Exercises - Standing Hip Abduction with Counter Support  - 1 x daily - 7 x weekly - 2 sets - 10 reps - 5 seconds hold - Sit to Stand with Counter Support  - 1 x daily - 7 x weekly - 2 sets - 5 reps      PT Short Term Goals - 05/03/22  1515       PT SHORT TERM GOAL #1   Title Patient will demonstrate independence with home exercise program for ability to self treat.    Baseline Has been doing his home exercises somewhat. No questions. (02/16/2022); 03/07/22: Performs every other day; Performing his HEP, no questions, seems to be doing him some good per pt (03/23/2022)    Time 2    Period Weeks    Status Achieved    Target Date 03/21/22      PT SHORT TERM GOAL #2   Title 6MWT c LRAD >870f -> (+) IADL;    Baseline 3066feval; 500 ft with rw, CGA, cues for increasing foot clearance and step  length and staying closer to the rw (02/16/2022): 03/07/2022: 945' with RW; 312 ft with rw (05/03/2022)    Time 4    Period Weeks    Status On-going    Target Date 06/02/22      PT SHORT TERM GOAL #3   Title FOTO>10% increase    Baseline Eval: 48; 03/07/22: 46; 50 (03/23/2022)    Time 4    Period Weeks    Status On-going    Target Date 06/02/22      PT SHORT TERM GOAL #4   Title 5xSTS<13sec s UE chair + airex pad.    Baseline Eval 15sec; 11 seconds with cues for increased speed (02/16/2022); 20.6 seconds with B UE assist, no Air Ex pad (05/03/2022)    Time 6    Period Weeks    Status On-going    Target Date 06/02/22              PT Long Term Goals - 05/03/22 1531       PT LONG TERM GOAL #1   Title Patient will have improved function and activity level as evidenced by an increase in FOTO score by 13 points or more.    Baseline Eval 48; 03/07/22: 46; 50 (03/23/2022)    Time 8    Period Weeks    Status On-going    Target Date 06/30/22      PT LONG TERM GOAL #2   Title 6MWT >1238f c LRAD    Baseline Eval: 3049fin 31m25m; 945' with RW completing full 6 minutes; 312 ft with rw (05/03/2022)    Time 8    Period Weeks    Status On-going    Target Date 06/30/22      PT LONG TERM GOAL #3   Title Pt will improve his DGI score with rw to > 19 points as a demonstration of decreased fall risk.    Baseline DGI with rw: 15 (01/03/2022);  03/07/22: 16 relying on RW; 15, did not use RW today (03/23/2022); 9 with rw (05/03/2022)    Time 8    Period Weeks    Status On-going    Target Date 06/30/22      PT LONG TERM GOAL #4   Title 5xSTS<15sec from chair, s BUE    Baseline eval: requires arms from chair height; 03/07/22: 13.32 sec without UE support; 20.6 seconds wiht B UE assist (05/03/2022)    Time 10    Period Weeks    Status On-going    Target Date 06/30/22              Plan - 05/19/22 1425     Clinical Impression Statement Improving foot clearance, step length, and steadiness with forward gait as well as turns in the clinic without using AD today. Still needs cues for increasing amplitude of movement. Pt tolerated session well without aggravation of symptoms. Pt will benefit from continued skilled physical therapy services to improve the aforementioned deficits.    Personal Factors and Comorbidities Age;Past/Current Experience    Comorbidities PD, H/o cancer,    Examination-Activity Limitations Squat;Stairs;Stand;Locomotion Level;Reach Overhead    Examination-Participation Restrictions Cleaning;Medication Management;Interpersonal Relationship;Community Activity    Stability/Clinical Decision Making Stable/Uncomplicated   regression in PT progress from COVID   Clinical Decision Making Low    Rehab Potential Fair    PT Frequency 2x / week    PT Duration 8 weeks    PT Treatment/Interventions ADLs/Self Care Home Management;Neuromuscular re-education;Balance training;Therapeutic exercise;Orthotic Fit/Training;Prosthetic Training;Patient/family education;Wheelchair  mobility training;Manual techniques;Passive range of motion;Energy conservation;Vestibular;Joint Manipulations;Gait training;Therapeutic activities;Functional mobility training;Stair training    PT Next Visit Plan Large movements, balance, functional strength, manual techniques, modalities PRN    PT Home Exercise Plan Medbridge Access Code: [email or web address]    Consulted and  Agree with Plan of Care Patient                             Joneen Boers PT, DPT  05/19/2022, 3:12 PM

## 2022-05-24 ENCOUNTER — Ambulatory Visit: Payer: Medicare HMO

## 2022-05-25 ENCOUNTER — Ambulatory Visit: Payer: Medicare HMO | Admitting: Urology

## 2022-05-25 ENCOUNTER — Encounter: Payer: Self-pay | Admitting: Urology

## 2022-05-25 VITALS — BP 130/80 | HR 74 | Ht 69.0 in | Wt 170.0 lb

## 2022-05-25 DIAGNOSIS — N401 Enlarged prostate with lower urinary tract symptoms: Secondary | ICD-10-CM

## 2022-05-25 DIAGNOSIS — R3914 Feeling of incomplete bladder emptying: Secondary | ICD-10-CM

## 2022-05-25 DIAGNOSIS — R8281 Pyuria: Secondary | ICD-10-CM | POA: Diagnosis not present

## 2022-05-25 DIAGNOSIS — R32 Unspecified urinary incontinence: Secondary | ICD-10-CM

## 2022-05-25 DIAGNOSIS — N3941 Urge incontinence: Secondary | ICD-10-CM | POA: Diagnosis not present

## 2022-05-25 LAB — BLADDER SCAN AMB NON-IMAGING: Scan Result: 108

## 2022-05-25 MED ORDER — MIRABEGRON ER 50 MG PO TB24: 50.0000 mg | ORAL_TABLET | Freq: Every day | ORAL | 0 refills | Status: DC

## 2022-05-25 NOTE — Progress Notes (Signed)
05/25/2022 2:11 PM   [name] August 02, 1946 [id number]  Referring provider: Adin Hector, MD [facility] [facility] [place],  [place] [address]  Chief Complaint  Patient presents with   Urinary Incontinence    HPI: Wesley Anderson. is a 76 y.o. male referred for evaluation of urinary incontinence.  He presents today with his wife who contributed to the history.  Recently seen [facility] with complaints of dysuria.  Urinalysis showed pyuria.  He was initially started on nitrofurantoin and switch to Keflex.  Subsequently switched to amoxicillin for persistent dysuria which he has been taking ~ 3 days.  Urine culture grew Enterococcus.  Dysuria has resolved. He has Parkinson's with frequency urgency and urge incontinence.  On tamsulosin and Gemtesa.  He has been taking Gemtesa approximately 4 months.  The medication was initially effective for his storage related voiding symptoms however recently has noted worsening urgency and urge incontinence. Denies flank, abdominal or pelvic pain No gross hematuria Also has a weak and intermittent urinary stream.  IPSS today 19/35.   PMH: Past Medical History:  Diagnosis Date   AAA (abdominal aortic aneurysm) ([place])    Alcohol dependence in remission ([place])    Cancer ([place])    skin    Colon polyps    ED (erectile dysfunction)    GERD (gastroesophageal reflux disease)    Hematest positive stools    Melanoma of skin (H[facility])    Parkinson's disease    Renal cyst, right    Restless leg syndrome     Surgical History: Past Surgical History:  Procedure Laterality Date   CHOLECYSTECTOMY     COLON SURGERY     colectomy with anastamosis   COLONOSCOPY WITH PROPOFOL N/A 11/07/2016   Procedure: COLONOSCOPY WITH PROPOFOL;  Surgeon: Lollie Sails, MD;  Location: [facility] ENDOSCOPY;  Service: Endoscopy;  Laterality: N/A;   COLONOSCOPY WITH PROPOFOL N/A 09/06/2018   Procedure: COLONOSCOPY WITH PROPOFOL;  Surgeon:  Lollie Sails, MD;  Location: [facility] ENDOSCOPY;  Service: Endoscopy;  Laterality: N/A;   COLONOSCOPY WITH PROPOFOL N/A 05/16/2019   Procedure: COLONOSCOPY WITH PROPOFOL;  Surgeon: Lollie Sails, MD;  Location: [facility] ENDOSCOPY;  Service: Endoscopy;  Laterality: N/A;   CYST EXCISION     vocal cord   DBS placement     deep brain implant     ESOPHAGOGASTRODUODENOSCOPY (EGD) WITH PROPOFOL N/A 09/06/2018   Procedure: ESOPHAGOGASTRODUODENOSCOPY (EGD) WITH PROPOFOL;  Surgeon: Lollie Sails, MD;  Location: [facility] ENDOSCOPY;  Service: Endoscopy;  Laterality: N/A;   MELANOMA EXCISION     TONSILLECTOMY      Home Medications:  Allergies as of 05/25/2022       Reactions   Artane [trihexyphenidyl] Anxiety   Propofol Other (See Comments)   Delirium Delirium Delirium Delirium        Medication List        Accurate as of May 25, 2022  2:11 PM. If you have any questions, ask your nurse or doctor.          carbidopa-levodopa 25-100 MG tablet Commonly known as: SINEMET IR Take 2 tablets by mouth 4 (four) times daily.   donepezil 10 MG tablet Commonly known as: ARICEPT Take 10 mg by mouth daily.   Elderberry Zinc/Vit C/Immune Lozg Use as directed 1 lozenge in the mouth or throat daily.   Gemtesa 75 MG Tabs Generic drug: Vibegron Take 75 mg by mouth every evening.   melatonin 3 MG  Tabs tablet Take 9 mg by mouth at bedtime.   multivitamin tablet Take 1 tablet by mouth daily.   pantoprazole 40 MG tablet Commonly known as: PROTONIX Take 40 mg by mouth daily.   polyethylene glycol 17 g packet Commonly known as: MIRALAX / GLYCOLAX Take 17 g by mouth daily as needed for mild constipation or moderate constipation.   QUEtiapine 25 MG tablet Commonly known as: SEROQUEL Take 75 mg by mouth at bedtime.   sertraline 100 MG tablet Commonly known as: ZOLOFT Take 100 mg by mouth daily.   tamsulosin 0.4 MG Caps capsule Commonly known as: FLOMAX Take 0.4 mg by mouth  daily.        Allergies:  Allergies  Allergen Reactions   Artane [Trihexyphenidyl] Anxiety   Propofol Other (See Comments)    Delirium Delirium Delirium Delirium     Family History: Family History  Problem Relation Age of Onset   Diabetes Father     Social History:  reports that he has quit smoking. He has never used smokeless tobacco. He reports current alcohol use of about 6.0 standard drinks of alcohol per week. He reports that he does not currently use drugs.   Physical Exam: BP 130/80   Pulse 74   Ht '5\' 9"'$  (1.753 m)   Wt 170 lb (77.1 kg)   BMI 25.10 kg/m   Constitutional:  Alert and oriented, No acute distress. HEENT: [place] AT Respiratory: Normal respiratory effort, no increased work of breathing. Psychiatric: Normal mood and affect.  Laboratory Data:  Urinalysis Dipstick trace blood/1+ leukocytes Microscopy 11-30 WBC/3-10 RBC   Assessment & Plan:    1.  BPH with LUTS Has both obstructive and storage related voiding symptoms PVR today 106 mL Continue tamsulosin  2.  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 Matilde Sprang  3.  Urinary tract infection Pyuria today's urinalysis however has only been on culture specific antibiotic for 3 days Continue amoxicillin Urine culture ordered   Abbie Sons, [place] [address] [place], [place] [address] [phone] Mild bar

## 2022-05-26 ENCOUNTER — Ambulatory Visit: Payer: Medicare HMO

## 2022-05-26 LAB — URINALYSIS, COMPLETE
Bilirubin, UA: NEGATIVE
Glucose, UA: NEGATIVE
Ketones, UA: NEGATIVE
Nitrite, UA: NEGATIVE
Protein,UA: NEGATIVE
Specific Gravity, UA: 1.02 (ref 1.005–1.030)
Urobilinogen, Ur: 0.2 mg/dL (ref 0.2–1.0)
pH, UA: 6.5 (ref 5.0–7.5)

## 2022-05-26 LAB — MICROSCOPIC EXAMINATION

## 2022-05-28 LAB — CULTURE, URINE COMPREHENSIVE

## 2022-05-31 ENCOUNTER — Ambulatory Visit: Payer: Medicare HMO

## 2022-06-07 ENCOUNTER — Ambulatory Visit: Payer: Medicare HMO

## 2022-06-07 DIAGNOSIS — R262 Difficulty in walking, not elsewhere classified: Secondary | ICD-10-CM | POA: Diagnosis not present

## 2022-06-07 DIAGNOSIS — R293 Abnormal posture: Secondary | ICD-10-CM

## 2022-06-07 NOTE — Therapy (Signed)
OUTPATIENT PHYSICAL THERAPY TREATMENT NOTE   Patient Name: Wesley Anderson. MRN: [id number] DOB:1946-07-11, 76 y.o., male Today's Date: 06/07/2022  PCP: Ramonita Lab III, MD REFERRING PROVIDER: Ramonita Lab III, MD   PT End of Session - 06/07/22 1418     Visit Number 26    Number of Visits 8    Date for PT Re-Evaluation 06/30/22    Authorization Type 6    Authorization Time Period 10 progress report    PT Start Time 1418    PT Stop Time 1456    PT Time Calculation (min) 38 min    Equipment Utilized During Treatment Gait belt   RW   Activity Tolerance Patient tolerated treatment well    Behavior During Therapy WFL for tasks assessed/performed                               Past Medical History:  Diagnosis Date   AAA (abdominal aortic aneurysm) ([place])    Alcohol dependence in remission ([name])    Cancer ([place])    skin    Colon polyps    ED (erectile dysfunction)    GERD (gastroesophageal reflux disease)    Hematest positive stools    Melanoma of skin (H[facility])    Parkinson's disease    Renal cyst, right    Restless leg syndrome    Past Surgical History:  Procedure Laterality Date   CHOLECYSTECTOMY     COLON SURGERY     colectomy with anastamosis   COLONOSCOPY WITH PROPOFOL N/A 11/07/2016   Procedure: COLONOSCOPY WITH PROPOFOL;  Surgeon: Lollie Sails, MD;  Location: [facility] ENDOSCOPY;  Service: Endoscopy;  Laterality: N/A;   COLONOSCOPY WITH PROPOFOL N/A 09/06/2018   Procedure: COLONOSCOPY WITH PROPOFOL;  Surgeon: Lollie Sails, MD;  Location: [facility] ENDOSCOPY;  Service: Endoscopy;  Laterality: N/A;   COLONOSCOPY WITH PROPOFOL N/A 05/16/2019   Procedure: COLONOSCOPY WITH PROPOFOL;  Surgeon: Lollie Sails, MD;  Location: [facility] ENDOSCOPY;  Service: Endoscopy;  Laterality: N/A;   CYST EXCISION     vocal cord   DBS placement     deep brain implant     ESOPHAGOGASTRODUODENOSCOPY (EGD) WITH PROPOFOL N/A 09/06/2018   Procedure:  ESOPHAGOGASTRODUODENOSCOPY (EGD) WITH PROPOFOL;  Surgeon: Lollie Sails, MD;  Location: [facility] ENDOSCOPY;  Service: Endoscopy;  Laterality: N/A;   MELANOMA EXCISION     TONSILLECTOMY     Patient Active Problem List   Diagnosis Date Noted   Chronic systolic (congestive) heart failure ([place]) 04/28/2022   COVID-19 virus infection 04/22/2022   Depression 04/22/2022   Acute gastroenteritis 08/08/2021   Sepsis ([place]) 08/08/2021   Confusion    Anemia 07/09/2021   Weakness 07/09/2021   Fever and chills 07/09/2021   Myalgia 07/09/2021   Influenza A 99/24/2683   Acute metabolic encephalopathy 41/96/2229   AKI (acute kidney injury) ([place]) 07/09/2020   Leukocytosis 07/09/2020   S/P deep brain stimulator placement 07/09/2020   Squamous cell carcinoma, 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 Pt was an active patient here in Nov 2022, but DC due to hospital admission for Flu in November and another due to encephalopathy in December. Pt working with HHPT/OT in the interim. Pt reports no falls in the past several months, had made efforts to be as safe as possible  PRECAUTIONS: Fall risk  SUBJECTIVE: Doing ok.  The swelling in his legs R > L come and go. Pt called Dr. Caryl Comes who told him to give it a few days. Swelling went down. No falls recently.    PAIN:  Are you having pain? 0/10     TODAY'S TREATMENT:  Neuromuscular re-education  CGA to SBA with standing tasks  Gait with rw 600 ft, cues for  increasing foot clearance, step length and staying closer to the rw.   Sitting with B feet propped on stool x 2 minutes, legs straight to stretch hamstring muscles to improve knee extension for gait.    Standing static large forward lunge with contralateral UE assist   R 10x  L 10x   Then with large forward then large backward lunge   R 10x2   L 10x2   Turning 360 degrees CGA, cues for increasing amplitude of movement. No UE assist  Clockwise 1x, then 3x  Counter clockwise 1x, then 3x   Gait without AD 32 ft x 3 CGA. Improved step length and foot clearance, no cues needed.       Cues for large movements Improved exercise technique, movement at target joints, use of target muscles after mod verbal, visual, tactile cues.     Response to treatment Pt tolerated session well without aggravation of symptoms.     Clinical impression   Continued working on increasing amplitude of movement to improve gait pattern, base of support, and balance. Able to ambulate 32 ft multiple times without AD safely with PT CGA and cues for increased step length and foot clearance. Pt tolerated session well without aggravation of symptoms. Pt will benefit from continued skilled physical therapy services to improve the aforementioned deficits.        PATIENT EDUCATION: Education details: ther-ex, HEP Person educated: Patient Education method: Explanation, Demonstration, Tactile cues, Verbal cues, and Handouts Education comprehension: verbalized understanding and returned demonstration   HOME EXERCISE PROGRAM: Access Code: [email or web address] URL: [email or web address] Date: 12/29/2021 Prepared by: Joneen Boers  Exercises - Standing Hip Abduction with Counter Support  - 1 x daily - 7 x weekly - 2 sets - 10 reps - 5 seconds hold - Sit to Stand with Counter Support  - 1 x daily - 7 x weekly - 2 sets - 5 reps      PT Short Term Goals - 05/03/22 1515       PT SHORT TERM GOAL #1    Title Patient will demonstrate independence with home exercise program for ability to self treat.    Baseline Has been doing his home exercises somewhat. No questions. (02/16/2022); 03/07/22: Performs every other day; Performing his HEP, no questions, seems to be doing him some good per pt (03/23/2022)    Time 2    Period Weeks    Status Achieved    Target Date 03/21/22      PT SHORT TERM GOAL #2   Title 6MWT c LRAD >875f -> (+) IADL;    Baseline 3056feval; 500 ft with rw, CGA, cues for increasing foot clearance and step length and staying closer to the rw (02/16/2022): 03/07/2022: 945' with RW;  312 ft with rw (05/03/2022)    Time 4    Period Weeks    Status On-going    Target Date 06/02/22      PT SHORT TERM GOAL #3   Title FOTO>10% increase    Baseline Eval: 48; 03/07/22: 46; 50 (03/23/2022)    Time 4    Period Weeks    Status On-going    Target Date 06/02/22      PT SHORT TERM GOAL #4   Title 5xSTS<13sec s UE chair + airex pad.    Baseline Eval 15sec; 11 seconds with cues for increased speed (02/16/2022); 20.6 seconds with B UE assist, no Air Ex pad (05/03/2022)    Time 6    Period Weeks    Status On-going    Target Date 06/02/22              PT Long Term Goals - 05/03/22 1531       PT LONG TERM GOAL #1   Title Patient will have improved function and activity level as evidenced by an increase in FOTO score by 13 points or more.    Baseline Eval 48; 03/07/22: 46; 50 (03/23/2022)    Time 8    Period Weeks    Status On-going    Target Date 06/30/22      PT LONG TERM GOAL #2   Title 6MWT >1279f c LRAD    Baseline Eval: 3056fin 25m67m; 945' with RW completing full 6 minutes; 312 ft with rw (05/03/2022)    Time 8    Period Weeks    Status On-going    Target Date 06/30/22      PT LONG TERM GOAL #3   Title Pt will improve his DGI score with rw to > 19 points as a demonstration of decreased fall risk.    Baseline DGI with rw: 15 (01/03/2022); 03/07/22: 16 relying on RW; 15, did  not use RW today (03/23/2022); 9 with rw (05/03/2022)    Time 8    Period Weeks    Status On-going    Target Date 06/30/22      PT LONG TERM GOAL #4   Title 5xSTS<15sec from chair, s BUE    Baseline eval: requires arms from chair height; 03/07/22: 13.32 sec without UE support; 20.6 seconds wiht B UE assist (05/03/2022)    Time 10    Period Weeks    Status On-going    Target Date 06/30/22              Plan - 06/07/22 1428     Clinical Impression Statement Continued working on increasing amplitude of movement to improve gait pattern, base of support, and balance. Able to ambulate 32 ft multiple times without AD safely with PT CGA and cues for increased step length and foot clearance. Pt tolerated session well without aggravation of symptoms. Pt will benefit from continued skilled physical therapy services to improve the aforementioned deficits.    Personal Factors and Comorbidities Age;Past/Current Experience    Comorbidities PD, H/o cancer,    Examination-Activity Limitations Squat;Stairs;Stand;Locomotion Level;Reach Overhead    Examination-Participation Restrictions Cleaning;Medication Management;Interpersonal Relationship;Community Activity    Stability/Clinical Decision Making Stable/Uncomplicated   regression in PT progress from COVID   Clinical Decision Making Low    Rehab Potential Fair    PT Frequency 2x / week    PT Duration 8 weeks    PT Treatment/Interventions ADLs/Self Care Home Management;Neuromuscular re-education;Balance training;Therapeutic exercise;Orthotic Fit/Training;Prosthetic Training;Patient/family education;Wheelchair mobility training;Manual techniques;Passive  range of motion;Energy conservation;Vestibular;Joint Manipulations;Gait training;Therapeutic activities;Functional mobility training;Stair training    PT Next Visit Plan Large movements, balance, functional strength, manual techniques, modalities PRN    PT Home Exercise Plan Medbridge Access Code: [email or web address]     Consulted and Agree with Plan of Care Patient                              Joneen Boers PT, DPT  06/07/2022, 2:58 PM

## 2022-06-09 ENCOUNTER — Ambulatory Visit: Payer: Medicare HMO

## 2022-06-14 ENCOUNTER — Ambulatory Visit: Payer: Medicare HMO

## 2022-06-14 DIAGNOSIS — R293 Abnormal posture: Secondary | ICD-10-CM

## 2022-06-14 DIAGNOSIS — R262 Difficulty in walking, not elsewhere classified: Secondary | ICD-10-CM | POA: Diagnosis not present

## 2022-06-14 NOTE — Therapy (Signed)
OUTPATIENT PHYSICAL THERAPY TREATMENT NOTE   Patient Name: Wesley Anderson. MRN: [id number] DOB:June 09, 1946, 76 y.o., male Today's Date: 06/14/2022  PCP: Ramonita Lab III, MD REFERRING PROVIDER: Ramonita Lab III, MD   PT End of Session - 06/14/22 1421     Visit Number 27    Number of Visits 62    Date for PT Re-Evaluation 06/30/22    Authorization Type 7    Authorization Time Period 10 progress report    PT Start Time 1421    PT Stop Time 1502    PT Time Calculation (min) 41 min    Equipment Utilized During Treatment Gait belt   RW   Activity Tolerance Patient tolerated treatment well    Behavior During Therapy WFL for tasks assessed/performed                                Past Medical History:  Diagnosis Date   AAA (abdominal aortic aneurysm) ([place])    Alcohol dependence in remission ([place])    Cancer ([place])    skin    Colon polyps    ED (erectile dysfunction)    GERD (gastroesophageal reflux disease)    Hematest positive stools    Melanoma of skin (HCC)    Parkinson's disease    Renal cyst, right    Restless leg syndrome    Past Surgical History:  Procedure Laterality Date   CHOLECYSTECTOMY     COLON SURGERY     colectomy with anastamosis   COLONOSCOPY WITH PROPOFOL N/A 11/07/2016   Procedure: COLONOSCOPY WITH PROPOFOL;  Surgeon: Lollie Sails, MD;  Location: [facility] ENDOSCOPY;  Service: Endoscopy;  Laterality: N/A;   COLONOSCOPY WITH PROPOFOL N/A 09/06/2018   Procedure: COLONOSCOPY WITH PROPOFOL;  Surgeon: Lollie Sails, MD;  Location: [facility] ENDOSCOPY;  Service: Endoscopy;  Laterality: N/A;   COLONOSCOPY WITH PROPOFOL N/A 05/16/2019   Procedure: COLONOSCOPY WITH PROPOFOL;  Surgeon: Lollie Sails, MD;  Location: [facility] ENDOSCOPY;  Service: Endoscopy;  Laterality: N/A;   CYST EXCISION     vocal cord   DBS placement     deep brain implant     ESOPHAGOGASTRODUODENOSCOPY (EGD) WITH PROPOFOL N/A 09/06/2018   Procedure:  ESOPHAGOGASTRODUODENOSCOPY (EGD) WITH PROPOFOL;  Surgeon: Lollie Sails, MD;  Location: [facility] ENDOSCOPY;  Service: Endoscopy;  Laterality: N/A;   MELANOMA EXCISION     TONSILLECTOMY     Patient Active Problem List   Diagnosis Date Noted   Chronic systolic (congestive) heart failure ([place]) 04/28/2022   COVID-19 virus infection 04/22/2022   Depression 04/22/2022   Acute gastroenteritis 08/08/2021   Sepsis ([place]) 08/08/2021   Confusion    Anemia 07/09/2021   Weakness 07/09/2021   Fever and chills 07/09/2021   Myalgia 07/09/2021   Influenza A 26/71/2458   Acute metabolic encephalopathy 09/98/3382   AKI (acute kidney injury) ([place]) 07/09/2020   Leukocytosis 07/09/2020   S/P deep brain stimulator placement 07/09/2020   Squamous cell carcinoma, 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 04/24/2020   Heme positive stool 05/27/2016   Alcohol dependence in remission ([place]) 07/31/2013   Cognitive impairment 02/22/2013   History of malignant melanoma of skin 05/15/2012   Parkinson's disease 09/28/2011    REFERRING DIAG: Unsteadiness on feet, history of falls  THERAPY DIAG:  Difficulty in walking, not elsewhere classified  Abnormal posture  PERTINENT HISTORY: Wesley Anderson is a 57yoM who is referred to [facility] for on-going imbalance in  the setting of Parkinson's Disease. Pt was an active patient here in Nov 2022, but DC due to hospital admission for Flu in November and another due to encephalopathy in December. Pt working with HHPT/OT in the interim. Pt reports no falls in the past several months, had made efforts to be as safe as possible  PRECAUTIONS: Fall risk  SUBJECTIVE: Doing ok.  The swelling in his legs are better.    PAIN:  Are you having pain? 0/10     TODAY'S TREATMENT:  Neuromuscular re-education  CGA to SBA with standing tasks  Gait with rw 200 ft, cues for increasing foot clearance, step length and staying closer to the rw.   Sit <> stand 5x  Then 5x quickly: 16  seconds, then 15 seconds with B UE assist.    Gait without AD 40 ft CGA. Good step length and foot clearance.    Directed patient with gait with normal gait speed, with changes in speed, 180 degree pivot turn, with R and L cervical rotation position, with cervical flexion and extension position, stepping around obstacles, stepping over an obstacle, ascending and descending 4 regular steps with UE assist   Reviewed progress with DGI with pt.    Seated hamstring stretch bilaterally on stool 2 minutes   Compensatory stepping correction- forward 5x                pt able to perform well, cues at times to step   Turning 360 degrees CGA, cues for increasing amplitude of movement. No UE assist  Clockwise 3x,   Counter clockwise 3x  Side stepping 30 ft to the R and 30 ft to the L     Cues for large movements Improved exercise technique, movement at target joints, use of target muscles after mod verbal, visual, tactile cues.     Response to treatment Pt tolerated session well without aggravation of symptoms.     Clinical impression  Pt demonstrates significant improvement in DGI score compared to previous measurement, not using AD. Improving step length, foot clearance and significant decrease in festination observed today. Pt also able to regain balance when losing balance in the forward direction during the forward compensatory stepping correction. Pt making very good progress with PT towards decreased fall risk and improving gait.  Pt tolerated session well without aggravation of symptoms. Pt will benefit from continued skilled physical therapy services to improve the aforementioned deficits.        PATIENT EDUCATION: Education details: ther-ex, HEP Person educated: Patient Education method: Explanation, Demonstration, Tactile cues, Verbal cues, and Handouts Education comprehension: verbalized understanding and returned demonstration   HOME EXERCISE PROGRAM: Access  Code: [email or web address] URL: [email or web address] Date: 12/29/2021 Prepared by: Joneen Boers  Exercises - Standing Hip Abduction with Counter Support  - 1 x daily - 7 x weekly - 2 sets - 10 reps - 5 seconds hold - Sit to Stand with Counter Support  - 1 x daily - 7 x weekly - 2 sets - 5 reps      PT Short Term Goals - 05/03/22 1515       PT SHORT TERM GOAL #1   Title Patient will demonstrate independence with home exercise program for ability to self treat.    Baseline Has been doing his home exercises somewhat. No questions. (02/16/2022); 03/07/22: Performs every other day; Performing his HEP, no questions, seems to be doing him some good per pt (03/23/2022)    Time 2    Period Weeks  Status Achieved    Target Date 03/21/22      PT SHORT TERM GOAL #2   Title 6MWT c LRAD >840f -> (+) IADL;    Baseline 3021feval; 500 ft with rw, CGA, cues for increasing foot clearance and step length and staying closer to the rw (02/16/2022): 03/07/2022: 945' with RW; 312 ft with rw (05/03/2022)    Time 4    Period Weeks    Status On-going    Target Date 06/02/22      PT SHORT TERM GOAL #3   Title FOTO>10% increase    Baseline Eval: 48; 03/07/22: 46; 50 (03/23/2022)    Time 4    Period Weeks    Status On-going    Target Date 06/02/22      PT SHORT TERM GOAL #4   Title 5xSTS<13sec s UE chair + airex pad.    Baseline Eval 15sec; 11 seconds with cues for increased speed (02/16/2022); 20.6 seconds with B UE assist, no Air Ex pad (05/03/2022)    Time 6    Period Weeks    Status On-going    Target Date 06/02/22              PT Long Term Goals - 05/03/22 1531       PT LONG TERM GOAL #1   Title Patient will have improved function and activity level as evidenced by an increase in FOTO score by 13 points or more.    Baseline Eval 48; 03/07/22: 46; 50 (03/23/2022)    Time 8    Period Weeks    Status On-going    Target Date 06/30/22      PT LONG TERM GOAL #2   Title 6MWT >120062f LRAD     Baseline Eval: 300f53f [id number]' with RW completing full 6 minutes; 312 ft with rw (05/03/2022)    Time 8    Period Weeks    Status On-going    Target Date 06/30/22      PT LONG TERM GOAL #3   Title Pt will improve his DGI score with rw to > 19 points as a demonstration of decreased fall risk.    Baseline DGI with rw: 15 (01/03/2022); 03/07/22: 16 relying on RW; 15, did not use RW today (03/23/2022); 9 with rw (05/03/2022)    Time 8    Period Weeks    Status On-going    Target Date 06/30/22      PT LONG TERM GOAL #4   Title 5xSTS<15sec from chair, s BUE    Baseline eval: requires arms from chair height; 03/07/22: 13.32 sec without UE support; 20.6 seconds wiht B UE assist (05/03/2022)    Time 10    Period Weeks    Status On-going    Target Date 06/30/22              Plan - 06/14/22 1420     Clinical Impression Statement Pt demonstrates significant improvement in DGI score compared to previous measurement, not using AD. Improving step length, foot clearance and significant decrease in festination observed today. Pt also able to regain balance when losing balance in the forward direction during the forward compensatory stepping correction. Pt making very good progress with PT towards decreased fall risk and improving gait.  Pt tolerated session well without aggravation of symptoms. Pt will benefit from continued skilled physical therapy services to improve the aforementioned deficits.    Personal Factors and Comorbidities Age;Past/Current Experience    Comorbidities  PD, H/o cancer,    Examination-Activity Limitations Squat;Stairs;Stand;Locomotion Level;Reach Overhead    Examination-Participation Restrictions Cleaning;Medication Management;Interpersonal Relationship;Community Activity    Stability/Clinical Decision Making Stable/Uncomplicated   regression in PT progress from COVID   Rehab Potential Fair    PT Frequency 2x / week    PT Duration 8 weeks    PT Treatment/Interventions  ADLs/Self Care Home Management;Neuromuscular re-education;Balance training;Therapeutic exercise;Orthotic Fit/Training;Prosthetic Training;Patient/family education;Wheelchair mobility training;Manual techniques;Passive range of motion;Energy conservation;Vestibular;Joint Manipulations;Gait training;Therapeutic activities;Functional mobility training;Stair training    PT Next Visit Plan Large movements, balance, functional strength, manual techniques, modalities PRN    PT Home Exercise Plan Medbridge Access Code: [email or web address]    Consulted and Agree with Plan of Care Patient                               Joneen Boers PT, DPT  06/14/2022, 4:14 PM

## 2022-06-16 ENCOUNTER — Telehealth: Payer: Self-pay | Admitting: Urology

## 2022-06-16 ENCOUNTER — Ambulatory Visit: Payer: Medicare HMO | Attending: Internal Medicine

## 2022-06-16 DIAGNOSIS — R293 Abnormal posture: Secondary | ICD-10-CM | POA: Diagnosis present

## 2022-06-16 DIAGNOSIS — R42 Dizziness and giddiness: Secondary | ICD-10-CM | POA: Diagnosis present

## 2022-06-16 DIAGNOSIS — R262 Difficulty in walking, not elsewhere classified: Secondary | ICD-10-CM | POA: Diagnosis not present

## 2022-06-16 NOTE — Therapy (Signed)
OUTPATIENT PHYSICAL THERAPY TREATMENT NOTE   Patient Name: Wesley Anderson. MRN: [id number] DOB:1946/02/14, 76 y.o., male Today's Date: 06/16/2022  PCP: Ramonita Lab III, MD REFERRING PROVIDER: Ramonita Lab III, MD   PT End of Session - 06/16/22 1451     Visit Number 28    Number of Visits 64    Date for PT Re-Evaluation 06/30/22    Authorization Type 7    Authorization Time Period 10 progress report    PT Start Time 1500    PT Stop Time 1544    PT Time Calculation (min) 44 min    Equipment Utilized During Treatment Gait belt   RW   Activity Tolerance Patient tolerated treatment well    Behavior During Therapy WFL for tasks assessed/performed                   Past Medical History:  Diagnosis Date   AAA (abdominal aortic aneurysm) ([place])    Alcohol dependence in remission ([place])    Cancer ([place])    skin    Colon polyps    ED (erectile dysfunction)    GERD (gastroesophageal reflux disease)    Hematest positive stools    Melanoma of skin ([facility])    Parkinson's disease    Renal cyst, right    Restless leg syndrome    Past Surgical History:  Procedure Laterality Date   CHOLECYSTECTOMY     COLON SURGERY     colectomy with anastamosis   COLONOSCOPY WITH PROPOFOL N/A 11/07/2016   Procedure: COLONOSCOPY WITH PROPOFOL;  Surgeon: Lollie Sails, MD;  Location: [facility] ENDOSCOPY;  Service: Endoscopy;  Laterality: N/A;   COLONOSCOPY WITH PROPOFOL N/A 09/06/2018   Procedure: COLONOSCOPY WITH PROPOFOL;  Surgeon: Lollie Sails, MD;  Location: [facility] ENDOSCOPY;  Service: Endoscopy;  Laterality: N/A;   COLONOSCOPY WITH PROPOFOL N/A 05/16/2019   Procedure: COLONOSCOPY WITH PROPOFOL;  Surgeon: Lollie Sails, MD;  Location: [facility] ENDOSCOPY;  Service: Endoscopy;  Laterality: N/A;   CYST EXCISION     vocal cord   DBS placement     deep brain implant     ESOPHAGOGASTRODUODENOSCOPY (EGD) WITH PROPOFOL N/A 09/06/2018   Procedure: ESOPHAGOGASTRODUODENOSCOPY (EGD) WITH PROPOFOL;   Surgeon: Lollie Sails, MD;  Location: [facility] ENDOSCOPY;  Service: Endoscopy;  Laterality: N/A;   MELANOMA EXCISION     TONSILLECTOMY     Patient Active Problem List   Diagnosis Date Noted   Chronic systolic (congestive) heart failure ([place]) 04/28/2022   COVID-19 virus infection 04/22/2022   Depression 04/22/2022   Acute gastroenteritis 08/08/2021   Sepsis ([place]) 08/08/2021   Confusion    Anemia 07/09/2021   Weakness 07/09/2021   Fever and chills 07/09/2021   Myalgia 07/09/2021   Influenza A 66/44/0347   Acute metabolic encephalopathy 42/59/5638   AKI (acute kidney injury) ([place]) 07/09/2020   Leukocytosis 07/09/2020   S/P deep brain stimulator placement 07/09/2020   Squamous cell carcinoma, 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 04/24/2020   Heme positive stool 05/27/2016   Alcohol dependence in remission ([place]) 07/31/2013   Cognitive impairment 02/22/2013   History of malignant melanoma of skin 05/15/2012   Parkinson's disease 09/28/2011    REFERRING DIAG: Unsteadiness on feet, history of falls  THERAPY DIAG:  Difficulty in walking, not elsewhere classified  Abnormal posture  Dizziness and giddiness  PERTINENT HISTORY: [name] is a 76yoM who is referred to [facility] OPPT for on-going imbalance in the setting of Parkinson's Disease. Pt was an active  patient here in Nov 2022, but DC due to hospital admission for Flu in November and another due to encephalopathy in December. Pt working with HHPT/OT in the interim. Pt reports no falls in the past several months, had made efforts to be as safe as possible  PRECAUTIONS: Fall risk  SUBJECTIVE: Pt reports feeling stiff. Denies falls.    PAIN:  Are you having pain? 0/10     TODAY'S TREATMENT:  Neuromuscular re-education  Nu-Step L2 for 5 min BUE and LE sue to reduce bodily stiffness and for reciprocal motion. 5 minutes total.   Gait without AD: 200' CGA with step through gait and improving foot clearance. Able to slow  down, stop, and increase speed. Intermittent, poor foot clearance with mod VC's to correct. Limited arm swing.   Standing trunk rotation with hand slap with shoulders flexed 90 deg and elbows extended:  x12/direction with Visual cues and verbal cues  Gait 300' with improvement in step lengths and foot clearance. CGA.   Standing lunges with emphasis on large amplitude motion. Alternating, x10/LE.   Gait 200' with good foot clearance. Min VC's. CGA.   At 4 square: anticipatory stepping strategy with PT naming direction for pt to step (Backwards, forwards, R/L, diagonal). 4 minutes total. Intermittent VC's for enlarged steps.   Gait 200' with good foot clearance. Min VC's. CGA.   Seated hamstring stretch on stool to reduce crouched gait: 2 minutes    Response to treatment Pt tolerated session well without aggravation of symptoms.     Clinical impression Continuing PT POC with focus on parkinsonsian deficits like postural instability and bradykinesia. Focus on larger step lengths, truncal rotation, anticipatory stepping strategies, and reciprocal gait mechanics in absence of AD. Pt overall tolerating session well but requiring frequent VC's for foot clearance. Pt will continue to benefit from skilled PT services to optimize independence and reduce falls risk with ADL's.      PATIENT EDUCATION: Education details: ther-ex, HEP Person educated: Patient Education method: Explanation, Demonstration, Tactile cues, Verbal cues, and Handouts Education comprehension: verbalized understanding and returned demonstration   HOME EXERCISE PROGRAM: Access Code: [email or web address] URL: [email or web address] Date: 12/29/2021 Prepared by: Joneen Boers  Exercises - Standing Hip Abduction with Counter Support  - 1 x daily - 7 x weekly - 2 sets - 10 reps - 5 seconds hold - Sit to Stand with Counter Support  - 1 x daily - 7 x weekly - 2 sets - 5 reps      PT Short Term Goals - 05/03/22  1515       PT SHORT TERM GOAL #1   Title Patient will demonstrate independence with home exercise program for ability to self treat.    Baseline Has been doing his home exercises somewhat. No questions. (02/16/2022); 03/07/22: Performs every other day; Performing his HEP, no questions, seems to be doing him some good per pt (03/23/2022)    Time 2    Period Weeks    Status Achieved    Target Date 03/21/22      PT SHORT TERM GOAL #2   Title 6MWT c LRAD >853f -> (+) IADL;    Baseline 308feval; 500 ft with rw, CGA, cues for increasing foot clearance and step length and staying closer to the rw (02/16/2022): 03/07/2022: 945' with RW; 312 ft with rw (05/03/2022)    Time 4    Period Weeks    Status On-going    Target Date 06/02/22  PT SHORT TERM GOAL #3   Title FOTO>10% increase    Baseline Eval: 48; 03/07/22: 46; 50 (03/23/2022)    Time 4    Period Weeks    Status On-going    Target Date 06/02/22      PT SHORT TERM GOAL #4   Title 5xSTS<13sec s UE chair + airex pad.    Baseline Eval 15sec; 11 seconds with cues for increased speed (02/16/2022); 20.6 seconds with B UE assist, no Air Ex pad (05/03/2022)    Time 6    Period Weeks    Status On-going    Target Date 06/02/22              PT Long Term Goals - 05/03/22 1531       PT LONG TERM GOAL #1   Title Patient will have improved function and activity level as evidenced by an increase in FOTO score by 13 points or more.    Baseline Eval 48; 03/07/22: 46; 50 (03/23/2022)    Time 8    Period Weeks    Status On-going    Target Date 06/30/22      PT LONG TERM GOAL #2   Title 6MWT >123f c LRAD    Baseline Eval: 3039fin 91m48m; 945' with RW completing full 6 minutes; 312 ft with rw (05/03/2022)    Time 8    Period Weeks    Status On-going    Target Date 06/30/22      PT LONG TERM GOAL #3   Title Pt will improve his DGI score with rw to > 19 points as a demonstration of decreased fall risk.    Baseline DGI with rw: 15 (01/03/2022);  03/07/22: 16 relying on RW; 15, did not use RW today (03/23/2022); 9 with rw (05/03/2022)    Time 8    Period Weeks    Status On-going    Target Date 06/30/22      PT LONG TERM GOAL #4   Title 5xSTS<15sec from chair, s BUE    Baseline eval: requires arms from chair height; 03/07/22: 13.32 sec without UE support; 20.6 seconds wiht B UE assist (05/03/2022)    Time 10    Period Weeks    Status On-going    Target Date 06/30/22               MilSalem Casterairly IV, PT, DPT Physical Therapist- [facility]1/09/2021, 3:46 PM

## 2022-06-16 NOTE — Telephone Encounter (Signed)
Pt's wife LMOM that Myrbetriq (pt was given samples) isn't working and wants to know if they can try a different med or if he needs to come in for another appt.

## 2022-06-17 NOTE — Telephone Encounter (Signed)
He has failed [place] and Myrbetriq.  Recommend appointment with Dr. Matilde Sprang to discuss second line options

## 2022-06-17 NOTE — Telephone Encounter (Signed)
LMOM for patient to return call and schedule an appointment with Dr. Matilde Sprang.

## 2022-06-21 ENCOUNTER — Ambulatory Visit: Payer: Medicare HMO

## 2022-06-21 DIAGNOSIS — R262 Difficulty in walking, not elsewhere classified: Secondary | ICD-10-CM | POA: Diagnosis not present

## 2022-06-21 DIAGNOSIS — R293 Abnormal posture: Secondary | ICD-10-CM

## 2022-06-21 NOTE — Therapy (Signed)
OUTPATIENT PHYSICAL THERAPY TREATMENT NOTE   Patient Name: Wesley Anderson. MRN: [id number] DOB:03/27/1946, 76 y.o., male Today's Date: 06/21/2022  PCP: Ramonita Lab III, MD REFERRING PROVIDER: Ramonita Lab III, MD   PT End of Session - 06/21/22 1330     Visit Number 29    Number of Visits 39    Date for PT Re-Evaluation 06/30/22    Authorization Type 9    Authorization Time Period 10 progress report    PT Start Time 1330    PT Stop Time 1414    PT Time Calculation (min) 44 min    Equipment Utilized During Treatment Gait belt   RW   Activity Tolerance Patient tolerated treatment well    Behavior During Therapy WFL for tasks assessed/performed                                 Past Medical History:  Diagnosis Date   AAA (abdominal aortic aneurysm) ([place])    Alcohol dependence in remission ([place])    Cancer ([place])    skin    Colon polyps    ED (erectile dysfunction)    GERD (gastroesophageal reflux disease)    Hematest positive stools    Melanoma of skin ([facility])    Parkinson's disease    Renal cyst, right    Restless leg syndrome    Past Surgical History:  Procedure Laterality Date   CHOLECYSTECTOMY     COLON SURGERY     colectomy with anastamosis   COLONOSCOPY WITH PROPOFOL N/A 11/07/2016   Procedure: COLONOSCOPY WITH PROPOFOL;  Surgeon: Lollie Sails, MD;  Location: [facility] ENDOSCOPY;  Service: Endoscopy;  Laterality: N/A;   COLONOSCOPY WITH PROPOFOL N/A 09/06/2018   Procedure: COLONOSCOPY WITH PROPOFOL;  Surgeon: Lollie Sails, MD;  Location: [facility] ENDOSCOPY;  Service: Endoscopy;  Laterality: N/A;   COLONOSCOPY WITH PROPOFOL N/A 05/16/2019   Procedure: COLONOSCOPY WITH PROPOFOL;  Surgeon: Lollie Sails, MD;  Location: [facility] ENDOSCOPY;  Service: Endoscopy;  Laterality: N/A;   CYST EXCISION     vocal cord   DBS placement     deep brain implant     ESOPHAGOGASTRODUODENOSCOPY (EGD) WITH PROPOFOL N/A 09/06/2018   Procedure:  ESOPHAGOGASTRODUODENOSCOPY (EGD) WITH PROPOFOL;  Surgeon: Lollie Sails, MD;  Location: [facility] ENDOSCOPY;  Service: Endoscopy;  Laterality: N/A;   MELANOMA EXCISION     TONSILLECTOMY     Patient Active Problem List   Diagnosis Date Noted   Chronic systolic (congestive) heart failure ([place]) 04/28/2022   COVID-19 virus infection 04/22/2022   Depression 04/22/2022   Acute gastroenteritis 08/08/2021   Sepsis ([employer]) 08/08/2021   Confusion    Anemia 07/09/2021   Weakness 07/09/2021   Fever and chills 07/09/2021   Myalgia 07/09/2021   Influenza A 09/98/3382   Acute metabolic encephalopathy 50/53/9767   AKI (acute kidney injury) ([place]) 07/09/2020   Leukocytosis 07/09/2020   S/P deep brain stimulator placement 07/09/2020   Squamous cell carcinoma, 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 04/24/2020   Heme positive stool 05/27/2016   Alcohol dependence in remission ([place]) 07/31/2013   Cognitive impairment 02/22/2013   History of malignant melanoma of skin 05/15/2012   Parkinson's disease 09/28/2011    REFERRING DIAG: Unsteadiness on feet, history of falls  THERAPY DIAG:  Difficulty in walking, not elsewhere classified  Abnormal posture  PERTINENT HISTORY: Happy Ky is a 63yoM who is referred to [place] for on-going imbalance  in the setting of Parkinson's Disease. Pt was an active patient here in Nov 2022, but DC due to hospital admission for Flu in November and another due to encephalopathy in December. Pt working with HHPT/OT in the interim. Pt reports no falls in the past several months, had made efforts to be as safe as possible  PRECAUTIONS: Fall risk  SUBJECTIVE: Doing ok for an old guy.   PAIN:  Are you having pain? 0/10     TODAY'S TREATMENT:  Neuromuscular re-education  CGA to SBA with standing tasks  Gait with rw 900 ft, cues for increasing foot clearance, step length and staying closer to the rw.   Sit <> stand quickly 5x2 with B UE assist  14 seconds, then 12  seconds  Gait without AD 210 ft CGA. Mod cues for increasing step length and foot clearance.   Seated hamstring stretch bilaterally on stool 2 minutes to promote knee extension and more upright posture  Turning 360 degrees CGA, cues for increasing amplitude of movement. No UE assist  Clockwise 3x,   Counter clockwise 3x  Standing alternating toe taps onto treadmill platform with UE assist PRN 10x each LE   Then alternating toe taps onto red cone 10x2 each LE, one UE assist PRN   Difficulty with R LE stance strength, LOB x 1 with PT mod A to recover    Stepping over 2 mini hurdles without UE assist 6x  Unsteady, LOB about 3x, min A to recover. Cues for increasing foot clearance and step length and decreasing circumduction.   SLS with contralateral UE assist   R 10x5 seconds   L 10x5 seconds     Cues for large movements Improved exercise technique, movement at target joints, use of target muscles after mod verbal, visual, tactile cues.     Response to treatment Pt tolerated session well without aggravation of symptoms.     Clinical impression  Worked on ambulation with increasing step length and foot cleraance without use of AD. Cues needed to perform. Decreased overall festination during gait compared to a few months ago. Difficulty with stepping over obstacles without UE assist secondary to weakness, decreased amplitude of movement, difficulty with proper weight shifting, and mechanics. Pt tolerated session well without aggravation of symptoms. Pt will benefit from continued skilled physical therapy services to improve the aforementioned deficits.        PATIENT EDUCATION: Education details: ther-ex, HEP Person educated: Patient Education method: Explanation, Demonstration, Tactile cues, Verbal cues, and Handouts Education comprehension: verbalized understanding and returned demonstration   HOME EXERCISE PROGRAM: Access Code: [email or web address] URL:  [email or web address] Date: 12/29/2021 Prepared by: Joneen Boers  Exercises - Standing Hip Abduction with Counter Support  - 1 x daily - 7 x weekly - 2 sets - 10 reps - 5 seconds hold - Sit to Stand with Counter Support  - 1 x daily - 7 x weekly - 2 sets - 5 reps      PT Short Term Goals - 05/03/22 1515       PT SHORT TERM GOAL #1   Title Patient will demonstrate independence with home exercise program for ability to self treat.    Baseline Has been doing his home exercises somewhat. No questions. (02/16/2022); 03/07/22: Performs every other day; Performing his HEP, no questions, seems to be doing him some good per pt (03/23/2022)    Time 2    Period Weeks    Status Achieved    Target Date 03/21/22  PT SHORT TERM GOAL #2   Title 6MWT c LRAD >835f -> (+) IADL;    Baseline 3072feval; 500 ft with rw, CGA, cues for increasing foot clearance and step length and staying closer to the rw (02/16/2022): 03/07/2022: 945' with RW; 312 ft with rw (05/03/2022)    Time 4    Period Weeks    Status On-going    Target Date 06/02/22      PT SHORT TERM GOAL #3   Title FOTO>10% increase    Baseline Eval: 48; 03/07/22: 46; 50 (03/23/2022)    Time 4    Period Weeks    Status On-going    Target Date 06/02/22      PT SHORT TERM GOAL #4   Title 5xSTS<13sec s UE chair + airex pad.    Baseline Eval 15sec; 11 seconds with cues for increased speed (02/16/2022); 20.6 seconds with B UE assist, no Air Ex pad (05/03/2022)    Time 6    Period Weeks    Status On-going    Target Date 06/02/22              PT Long Term Goals - 05/03/22 1531       PT LONG TERM GOAL #1   Title Patient will have improved function and activity level as evidenced by an increase in FOTO score by 13 points or more.    Baseline Eval 48; 03/07/22: 46; 50 (03/23/2022)    Time 8    Period Weeks    Status On-going    Target Date 06/30/22      PT LONG TERM GOAL #2   Title 6MWT >120071f LRAD    Baseline Eval: 300f41fn 2m4311m945' with RW completing full 6 minutes; 312 ft with rw (05/03/2022)    Time 8    Period Weeks    Status On-going    Target Date 06/30/22      PT LONG TERM GOAL #3   Title Pt will improve his DGI score with rw to > 19 points as a demonstration of decreased fall risk.    Baseline DGI with rw: 15 (01/03/2022); 03/07/22: 16 relying on RW; 15, did not use RW today (03/23/2022); 9 with rw (05/03/2022)    Time 8    Period Weeks    Status On-going    Target Date 06/30/22      PT LONG TERM GOAL #4   Title 5xSTS<15sec from chair, s BUE    Baseline eval: requires arms from chair height; 03/07/22: 13.32 sec without UE support; 20.6 seconds wiht B UE assist (05/03/2022)    Time 10    Period Weeks    Status On-going    Target Date 06/30/22              Plan - 06/21/22 1327     Personal Factors and Comorbidities Age;Past/Current Experience    Comorbidities PD, H/o cancer,    Examination-Activity Limitations Squat;Stairs;Stand;Locomotion Level;Reach Overhead    Examination-Participation Restrictions Cleaning;Medication Management;Interpersonal Relationship;Community Activity    Stability/Clinical Decision Making Stable/Uncomplicated   regression in PT progress from COVID   Rehab Potential Fair    PT Frequency 2x / week    PT Duration 8 weeks    PT Treatment/Interventions ADLs/Self Care Home Management;Neuromuscular re-education;Balance training;Therapeutic exercise;Orthotic Fit/Training;Prosthetic Training;Patient/family education;Wheelchair mobility training;Manual techniques;Passive range of motion;Energy conservation;Vestibular;Joint Manipulations;Gait training;Therapeutic activities;Functional mobility training;Stair training    PT Next Visit Plan Large movements, balance, functional strength, manual techniques, modalities PRN  PT Home Exercise Plan Medbridge Access Code: [email or web address]    Consulted and Agree with Plan of Care Patient                                 Joneen Boers PT, DPT  06/21/2022, 6:55 PM

## 2022-06-23 ENCOUNTER — Ambulatory Visit: Payer: Medicare HMO

## 2022-06-23 DIAGNOSIS — R293 Abnormal posture: Secondary | ICD-10-CM

## 2022-06-23 DIAGNOSIS — R262 Difficulty in walking, not elsewhere classified: Secondary | ICD-10-CM

## 2022-06-23 NOTE — Therapy (Signed)
OUTPATIENT PHYSICAL THERAPY TREATMENT NOTE And Progress Report (04/21/2022 - 06/23/2022)   Patient Name: Wesley Anderson. MRN: [id number] DOB:1945-10-31, 77 y.o., male Today's Date: 06/23/2022  PCP: Wesley Lab III, MD REFERRING PROVIDER: Ramonita Lab III, MD   PT End of Session - 06/23/22 1335     Visit Number 30    Number of Visits 83    Date for PT Re-Evaluation 06/30/22    Authorization Type 10    Authorization Time Period 10 progress report    PT Start Time 1335    PT Stop Time 1415    PT Time Calculation (min) 40 min    Equipment Utilized During Treatment Gait belt   RW   Activity Tolerance Patient tolerated treatment well    Behavior During Therapy WFL for tasks assessed/performed                                  Past Medical History:  Diagnosis Date   AAA (abdominal aortic aneurysm) ([place])    Alcohol dependence in remission ([place])    Cancer ([place])    skin    Colon polyps    ED (erectile dysfunction)    GERD (gastroesophageal reflux disease)    Hematest positive stools    Melanoma of skin ([facility])    Parkinson's disease    Renal cyst, right    Restless leg syndrome    Past Surgical History:  Procedure Laterality Date   CHOLECYSTECTOMY     COLON SURGERY     colectomy with anastamosis   COLONOSCOPY WITH PROPOFOL N/A 11/07/2016   Procedure: COLONOSCOPY WITH PROPOFOL;  Surgeon: Wesley Sails, MD;  Location: [facility] ENDOSCOPY;  Service: Endoscopy;  Laterality: N/A;   COLONOSCOPY WITH PROPOFOL N/A 09/06/2018   Procedure: COLONOSCOPY WITH PROPOFOL;  Surgeon: Wesley Sails, MD;  Location: [facility] ENDOSCOPY;  Service: Endoscopy;  Laterality: N/A;   COLONOSCOPY WITH PROPOFOL N/A 05/16/2019   Procedure: COLONOSCOPY WITH PROPOFOL;  Surgeon: Wesley Sails, MD;  Location: [facility] ENDOSCOPY;  Service: Endoscopy;  Laterality: N/A;   CYST EXCISION     vocal cord   DBS placement     deep brain implant     ESOPHAGOGASTRODUODENOSCOPY (EGD) WITH  PROPOFOL N/A 09/06/2018   Procedure: ESOPHAGOGASTRODUODENOSCOPY (EGD) WITH PROPOFOL;  Surgeon: Wesley Sails, MD;  Location: [facility] ENDOSCOPY;  Service: Endoscopy;  Laterality: N/A;   MELANOMA EXCISION     TONSILLECTOMY     Patient Active Problem List   Diagnosis Date Noted   Chronic systolic (congestive) heart failure ([place]) 04/28/2022   COVID-19 virus infection 04/22/2022   Depression 04/22/2022   Acute gastroenteritis 08/08/2021   Sepsis ([place]) 08/08/2021   Confusion    Anemia 07/09/2021   Weakness 07/09/2021   Fever and chills 07/09/2021   Myalgia 07/09/2021   Influenza A 02/63/7858   Acute metabolic encephalopathy 85/09/7739   AKI (acute kidney injury) ([place]) 07/09/2020   Leukocytosis 07/09/2020   S/P deep brain stimulator placement 07/09/2020   Squamous cell carcinoma, 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 04/24/2020   Heme positive stool 05/27/2016   Alcohol dependence in remission ([place]) 07/31/2013   Cognitive impairment 02/22/2013   History of malignant melanoma of skin 05/15/2012   Parkinson's disease 09/28/2011    REFERRING DIAG: Unsteadiness on feet, history of falls  THERAPY DIAG:  Difficulty in walking, not elsewhere classified  Abnormal posture  PERTINENT HISTORY: Wesley Anderson is a 54yoM who is  referred to [facility] OPPT for on-going imbalance in the setting of Parkinson's Disease. Pt was an active patient here in Nov 2022, but DC due to hospital admission for Flu in November and another due to encephalopathy in December. Pt working with HHPT/OT in the interim. Pt reports no falls in the past several months, had made efforts to be as safe as possible  PRECAUTIONS: Fall risk  SUBJECTIVE: Doing ok for an old guy.   PAIN:  Are you having pain? 0/10     TODAY'S TREATMENT:  Neuromuscular re-education  CGA to SBA with standing tasks  Gait with rw x 6 minutes 866 ft, CGA, cues to stay closer to rw and improving foot clearance and step length  Sit <> stand from  regular chair 5x with B UE assist quickly 12.86 seconds    Turning 360 degrees CGA, cues for increasing amplitude of movement as well as placing and maintaining his center of gravity over feet/base of support. No UE assist  Clockwise 3x,   Counter clockwise 3x   Sit <> stand with B UE assist from regular chair with Air Ex pad 5x quickly   Gait without AD but with 2 lbs ankle weight 200 ft CGA. Mod cues for increasing step length and foot clearance.   Then 100 ft   Standing alternating toe taps onto red cone 6x each LE, one UE assist PRN  LOB multiple times when on R LE single leg stance without UE assist, Max assist to recover  LOB 1x when on L LE single leg stance, max assist to recover   SLS on R LE with L UE assist 10x5 seconds  Cues for large movements Improved exercise technique, movement at target joints, use of target muscles after mod verbal, visual, tactile cues.     Response to treatment Pt tolerated session well without aggravation of symptoms.     Clinical impression   Pt demonstrates improved DGI score to 19 without use of AD suggesting decreased risk for falls. Pt also currently able to ambulate at least 200 ft without use of AD but with CGA with consistent cues for increasing foot clearance and step length. Pt however still needs use of rw secondary to tendency to return to festinating gait pattern increasing risk for pt tripping on an obstacle. Pt also demonstrates improved 5 times sit to stand time compared to his time in September after just recovering from [place]. Overall pt making some progress with PT towards goals. Challenges to progress include progressive nature of Parkinson's. Pt tolerated session well without aggravation of symptoms. Pt will benefit from continued skilled physical therapy services to improve the aforementioned deficits.        PATIENT EDUCATION: Education details: ther-ex, HEP Person educated: Patient Education method:  Explanation, Demonstration, Tactile cues, Verbal cues, and Handouts Education comprehension: verbalized understanding and returned demonstration   HOME EXERCISE PROGRAM: Access Code: [email or web address] URL: [email or web address] Date: 12/29/2021 Prepared by: Joneen Boers  Exercises - Standing Hip Abduction with Counter Support  - 1 x daily - 7 x weekly - 2 sets - 10 reps - 5 seconds hold - Sit to Stand with Counter Support  - 1 x daily - 7 x weekly - 2 sets - 5 reps      PT Short Term Goals - 06/23/22 1353       PT SHORT TERM GOAL #1   Title Patient will demonstrate independence with home exercise program for ability to self treat.    Baseline Has  been doing his home exercises somewhat. No questions. (02/16/2022); 03/07/22: Performs every other day; Performing his HEP, no questions, seems to be doing him some good per pt (03/23/2022)    Time 2    Period Weeks    Status Achieved    Target Date 03/21/22      PT SHORT TERM GOAL #2   Title 6MWT c LRAD >860f -> (+) IADL;    Baseline 304feval; 500 ft with rw, CGA, cues for increasing foot clearance and step length and staying closer to the rw (02/16/2022): 03/07/2022: 945' with RW; 312 ft with rw (05/03/2022); 866 ft with rw (06/23/2022)    Time 4    Period Weeks    Status Achieved    Target Date 06/02/22      PT SHORT TERM GOAL #3   Title FOTO>10% increase    Baseline Eval: 48; 03/07/22: 46; 50 (03/23/2022)    Time 4    Period Weeks    Status On-going    Target Date 06/02/22      PT SHORT TERM GOAL #4   Title 5xSTS<13sec s UE chair + airex pad.    Baseline Eval 15sec; 11 seconds with cues for increased speed (02/16/2022); 20.6 seconds with B UE assist, no Air Ex pad (05/03/2022); 11.40 seconds (06/23/2022)    Time 6    Period Weeks    Status Achieved    Target Date 06/02/22              PT Long Term Goals - 06/23/22 1347       PT LONG TERM GOAL #1   Title Patient will have improved function and activity level as  evidenced by an increase in FOTO score by 13 points or more.    Baseline Eval 48; 03/07/22: 46; 50 (03/23/2022)    Time 8    Period Weeks    Status On-going    Target Date 06/30/22      PT LONG TERM GOAL #2   Title 6MWT >120030f LRAD    Baseline Eval: 300f60f 2m4355m945' with RW completing full 6 minutes; 312 ft with rw (05/03/2022); 866 ft with rw (06/23/2022)    Time 8    Period Weeks    Status On-going    Target Date 06/30/22      PT LONG TERM GOAL #3   Title Pt will improve his DGI score with rw to > 19 points as a demonstration of decreased fall risk.    Baseline DGI with rw: 15 (01/03/2022); 03/07/22: 16 relying on RW; 15, did not use RW today (03/23/2022); 9 with rw (05/03/2022); no AD 19 pionts (06/14/2022)    Time 8    Period Weeks    Status Partially Met    Target Date 06/30/22      PT LONG TERM GOAL #4   Title 5xSTS<15sec from chair, s BUE    Baseline eval: requires arms from chair height; 03/07/22: 13.32 sec without UE support; 20.6 seconds wiht B UE assist (05/03/2022); 12.86 seconds (06/23/2022)    Time 10    Period Weeks    Status Achieved    Target Date 06/30/22              Plan - 06/23/22 1333     Clinical Impression Statement Pt demonstrates improved DGI score to 19 without use of AD suggesting decreased risk for falls. Pt also currently able to ambulate at least 200 ft without use of AD but  with CGA with consistent cues for increasing foot clearance and step length. Pt however still needs use of rw secondary to tendency to return to festinating gait pattern increasing risk for pt tripping on an obstacle. Pt also demonstrates improved 5 times sit to stand time compared to his time in September after just recovering from [place]. Overall pt making some progress with PT towards goals. Challenges to progress include progressive nature of Parkinson's. Pt tolerated session well without aggravation of symptoms. Pt will benefit from continued skilled physical therapy services to  improve the aforementioned deficits.    Personal Factors and Comorbidities Age;Past/Current Experience    Comorbidities PD, H/o cancer,    Examination-Activity Limitations Squat;Stairs;Stand;Locomotion Level;Reach Overhead    Examination-Participation Restrictions Cleaning;Medication Management;Interpersonal Relationship;Community Activity    Stability/Clinical Decision Making Stable/Uncomplicated   regression in PT progress from COVID   Clinical Decision Making Low    Rehab Potential Fair    PT Frequency 2x / week    PT Duration 8 weeks    PT Treatment/Interventions ADLs/Self Care Home Management;Neuromuscular re-education;Balance training;Therapeutic exercise;Orthotic Fit/Training;Prosthetic Training;Patient/family education;Wheelchair mobility training;Manual techniques;Passive range of motion;Energy conservation;Vestibular;Joint Manipulations;Gait training;Therapeutic activities;Functional mobility training;Stair training    PT Next Visit Plan Large movements, balance, functional strength, manual techniques, modalities PRN    PT Home Exercise Plan Medbridge Access Code: [email or web address]    Consulted and Agree with Plan of Care Patient                               Thank you for your referral.   Joneen Boers PT, DPT  06/23/2022, 3:44 PM

## 2022-06-27 ENCOUNTER — Ambulatory Visit: Payer: Medicare HMO

## 2022-06-27 DIAGNOSIS — R262 Difficulty in walking, not elsewhere classified: Secondary | ICD-10-CM

## 2022-06-27 DIAGNOSIS — R293 Abnormal posture: Secondary | ICD-10-CM

## 2022-06-27 NOTE — Therapy (Signed)
OUTPATIENT PHYSICAL THERAPY TREATMENT NOTE    Patient Name: Wesley Anderson. MRN: [id number] DOB:[birth year], 76 y.o., male Today's Date: 06/27/2022  PCP: Ramonita Lab III, MD REFERRING PROVIDER: Ramonita Lab III, MD   PT End of Session - 06/27/22 1340     Visit Number 31    Number of Visits 21    Date for PT Re-Evaluation 06/30/22    Authorization Type 1    Authorization Time Period 10 progress report    PT Start Time 8315   pt arrived late   PT Stop Time 1412    PT Time Calculation (min) 32 min    Equipment Utilized During Treatment Gait belt   RW   Activity Tolerance Patient tolerated treatment well    Behavior During Therapy WFL for tasks assessed/performed                                   Past Medical History:  Diagnosis Date   AAA (abdominal aortic aneurysm) ([place])    Alcohol dependence in remission ([place])    Cancer ([place])    skin    Colon polyps    ED (erectile dysfunction)    GERD (gastroesophageal reflux disease)    Hematest positive stools    Melanoma of skin ([facility])    Parkinson's disease    Renal cyst, right    Restless leg syndrome    Past Surgical History:  Procedure Laterality Date   CHOLECYSTECTOMY     COLON SURGERY     colectomy with anastamosis   COLONOSCOPY WITH PROPOFOL N/A 11/07/2016   Procedure: COLONOSCOPY WITH PROPOFOL;  Surgeon: Lollie Sails, MD;  Location: [facility] ENDOSCOPY;  Service: Endoscopy;  Laterality: N/A;   COLONOSCOPY WITH PROPOFOL N/A 09/06/2018   Procedure: COLONOSCOPY WITH PROPOFOL;  Surgeon: Lollie Sails, MD;  Location: [facility] ENDOSCOPY;  Service: Endoscopy;  Laterality: N/A;   COLONOSCOPY WITH PROPOFOL N/A 05/16/2019   Procedure: COLONOSCOPY WITH PROPOFOL;  Surgeon: Lollie Sails, MD;  Location: [facility] ENDOSCOPY;  Service: Endoscopy;  Laterality: N/A;   CYST EXCISION     vocal cord   DBS placement     deep brain implant     ESOPHAGOGASTRODUODENOSCOPY (EGD) WITH PROPOFOL N/A 09/06/2018    Procedure: ESOPHAGOGASTRODUODENOSCOPY (EGD) WITH PROPOFOL;  Surgeon: Lollie Sails, MD;  Location: [facility] ENDOSCOPY;  Service: Endoscopy;  Laterality: N/A;   MELANOMA EXCISION     TONSILLECTOMY     Patient Active Problem List   Diagnosis Date Noted   Chronic systolic (congestive) heart failure ([place]) 04/28/2022   COVID-19 virus infection 04/22/2022   Depression 04/22/2022   Acute gastroenteritis 08/08/2021   Sepsis ([place]) 08/08/2021   Confusion    Anemia 07/09/2021   Weakness 07/09/2021   Fever and chills 07/09/2021   Myalgia 07/09/2021   Influenza A 17/61/6073   Acute metabolic encephalopathy 71/01/2693   AKI (acute kidney injury) ([place]) 07/09/2020   Leukocytosis 07/09/2020   S/P deep brain stimulator placement 07/09/2020   Squamous cell carcinoma, 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 04/24/2020   Heme positive stool 05/27/2016   Alcohol dependence in remission ([place]) 07/31/2013   Cognitive impairment 02/22/2013   History of malignant melanoma of skin 05/15/2012   Parkinson's disease 09/28/2011    REFERRING DIAG: Unsteadiness on feet, history of falls  THERAPY DIAG:  Difficulty in walking, not elsewhere classified  Abnormal posture  PERTINENT HISTORY: Juell Radney is a 51yoM who is  referred to [facility] OPPT for on-going imbalance in the setting of Parkinson's Disease. Pt was an active patient here in Nov 2022, but DC due to hospital admission for Flu in November and another due to encephalopathy in December. Pt working with HHPT/OT in the interim. Pt reports no falls in the past several months, had made efforts to be as safe as possible  PRECAUTIONS: Fall risk  SUBJECTIVE: Doing ok. No falls recently.   PAIN:  Are you having pain? 0/10     TODAY'S TREATMENT:  Neuromuscular re-education   CGA to SBA with standing tasks  NuStep, seat 8, arms 8, level 2 for 5 minutes to promote alternating reciprocal movement and cardiovascular work.   SLS with contralateral UE  assist   R 10x5 seconds for 2 sets   L 10x5 seconds for 2 sets  Seated with feet propped on stool to promote hamstring stretch and knee extension to promote upright posture x 2 minutes  Standing alternating toe taps onto red and green cones with B UE assist 10x2   Gait without AD but with 2 lbs ankle weight 200 ft CGA. Mod to max cues for increasing step length and foot clearance.      Cues for large movements Improved exercise technique, movement at target joints, use of target muscles after mod verbal, visual, tactile cues.     Response to treatment Pt tolerated session well without aggravation of symptoms.     Clinical impression  Pt arrived late so session was adjusted accordingly. Continued working on improving amplitude of movement to promote foot clearance and step length to decrease fall risk during gait. Able to ambulate with PT without use of AD with mod to max cues for foot clearance and step length. Continues need for rw secondary to tendency to return to festinating gait pattern. Challenges to progress include progressive nature of Parkinson's. Pt tolerated session well without aggravation of symptoms. Pt will benefit from continued skilled physical therapy services to improve the aforementioned deficits.        PATIENT EDUCATION: Education details: ther-ex, HEP Person educated: Patient Education method: Explanation, Demonstration, Tactile cues, Verbal cues, and Handouts Education comprehension: verbalized understanding and returned demonstration   HOME EXERCISE PROGRAM: Access Code: [email or web address] URL: [email or web address] Date: 12/29/2021 Prepared by: Joneen Boers  Exercises - Standing Hip Abduction with Counter Support  - 1 x daily - 7 x weekly - 2 sets - 10 reps - 5 seconds hold - Sit to Stand with Counter Support  - 1 x daily - 7 x weekly - 2 sets - 5 reps      PT Short Term Goals - 06/23/22 1353       PT SHORT TERM GOAL #1   Title  Patient will demonstrate independence with home exercise program for ability to self treat.    Baseline Has been doing his home exercises somewhat. No questions. (02/16/2022); 03/07/22: Performs every other day; Performing his HEP, no questions, seems to be doing him some good per pt (03/23/2022)    Time 2    Period Weeks    Status Achieved    Target Date 03/21/22      PT SHORT TERM GOAL #2   Title 6MWT c LRAD >859f -> (+) IADL;    Baseline 3042feval; 500 ft with rw, CGA, cues for increasing foot clearance and step length and staying closer to the rw (02/16/2022): 03/07/2022: 945' with RW; 312 ft with rw (05/03/2022); 866 ft with rw (06/23/2022)  Time 4    Period Weeks    Status Achieved    Target Date 06/02/22      PT SHORT TERM GOAL #3   Title FOTO>10% increase    Baseline Eval: 48; 03/07/22: 46; 50 (03/23/2022)    Time 4    Period Weeks    Status On-going    Target Date 06/02/22      PT SHORT TERM GOAL #4   Title 5xSTS<13sec s UE chair + airex pad.    Baseline Eval 15sec; 11 seconds with cues for increased speed (02/16/2022); 20.6 seconds with B UE assist, no Air Ex pad (05/03/2022); 11.40 seconds (06/23/2022)    Time 6    Period Weeks    Status Achieved    Target Date 06/02/22              PT Long Term Goals - 06/23/22 1347       PT LONG TERM GOAL #1   Title Patient will have improved function and activity level as evidenced by an increase in FOTO score by 13 points or more.    Baseline Eval 48; 03/07/22: 46; 50 (03/23/2022)    Time 8    Period Weeks    Status On-going    Target Date 06/30/22      PT LONG TERM GOAL #2   Title 6MWT >1236f c LRAD    Baseline Eval: 3047fin 41m66m; 945' with RW completing full 6 minutes; 312 ft with rw (05/03/2022); 866 ft with rw (06/23/2022)    Time 8    Period Weeks    Status On-going    Target Date 06/30/22      PT LONG TERM GOAL #3   Title Pt will improve his DGI score with rw to > 19 points as a demonstration of decreased fall risk.     Baseline DGI with rw: 15 (01/03/2022); 03/07/22: 16 relying on RW; 15, did not use RW today (03/23/2022); 9 with rw (05/03/2022); no AD 19 pionts (06/14/2022)    Time 8    Period Weeks    Status Partially Met    Target Date 06/30/22      PT LONG TERM GOAL #4   Title 5xSTS<15sec from chair, s BUE    Baseline eval: requires arms from chair height; 03/07/22: 13.32 sec without UE support; 20.6 seconds wiht B UE assist (05/03/2022); 12.86 seconds (06/23/2022)    Time 10    Period Weeks    Status Achieved    Target Date 06/30/22              Plan - 06/27/22 1340     Clinical Impression Statement Pt arrived late so session was adjusted accordingly. Continued working on improving amplitude of movement to promote foot clearance and step length to decrease fall risk during gait. Able to ambulate with PT without use of AD with mod to max cues for foot clearance and step length. Continues need for rw secondary to tendency to return to festinating gait pattern. Challenges to progress include progressive nature of Parkinson's. Pt tolerated session well without aggravation of symptoms. Pt will benefit from continued skilled physical therapy services to improve the aforementioned deficits.    Personal Factors and Comorbidities Age;Past/Current Experience    Comorbidities PD, H/o cancer,    Examination-Activity Limitations Squat;Stairs;Stand;Locomotion Level;Reach Overhead    Examination-Participation Restrictions Cleaning;Medication Management;Interpersonal Relationship;Community Activity    Stability/Clinical Decision Making Stable/Uncomplicated   regression in PT progress from [place]  PT Frequency 2x / week    PT Duration 8 weeks    PT Treatment/Interventions ADLs/Self Care Home Management;Neuromuscular re-education;Balance training;Therapeutic exercise;Orthotic Fit/Training;Prosthetic Training;Patient/family education;Wheelchair mobility training;Manual techniques;Passive range of  motion;Energy conservation;Vestibular;Joint Manipulations;Gait training;Therapeutic activities;Functional mobility training;Stair training    PT Next Visit Plan Large movements, balance, functional strength, manual techniques, modalities PRN    PT Home Exercise Plan Medbridge Access Code: [email or web address]    Consulted and Agree with Plan of Care Patient                                  Joneen Boers PT, DPT  06/27/2022, 4:57 PM

## 2022-06-30 ENCOUNTER — Ambulatory Visit: Payer: Medicare HMO

## 2022-07-04 ENCOUNTER — Ambulatory Visit: Payer: Medicare HMO | Admitting: Urology

## 2022-07-04 VITALS — BP 146/103 | HR 59 | Ht 67.0 in | Wt 170.0 lb

## 2022-07-04 DIAGNOSIS — N3941 Urge incontinence: Secondary | ICD-10-CM

## 2022-07-04 LAB — URINALYSIS, COMPLETE
Bilirubin, UA: NEGATIVE
Glucose, UA: NEGATIVE
Nitrite, UA: NEGATIVE
Protein,UA: NEGATIVE
Specific Gravity, UA: 1.02 (ref 1.005–1.030)
Urobilinogen, Ur: 0.2 mg/dL (ref 0.2–1.0)
pH, UA: 6 (ref 5.0–7.5)

## 2022-07-04 LAB — MICROSCOPIC EXAMINATION: RBC, Urine: >30 /hpf — AB (ref 0–2)

## 2022-07-04 MED ORDER — TRIMETHOPRIM 100 MG PO TABS: 100.0000 mg | ORAL_TABLET | Freq: Every day | ORAL | 11 refills | Status: DC

## 2022-07-04 MED ORDER — CIPROFLOXACIN HCL 500 MG PO TABS: 500.0000 mg | ORAL_TABLET | Freq: Two times a day (BID) | ORAL | 0 refills | Status: AC

## 2022-07-04 NOTE — Progress Notes (Signed)
07/04/2022 2:33 PM   Wesley Anderson 04/28/1946 [id number]  Referring provider: Adin Hector, MD [facility] [address],  [place] [address]  Chief Complaint  Patient presents with   Follow-up    HPI: ST: Given Macrodantin for dysuria followed by Keflex.  Had a positive culture.  Has Parkinson's and urge incontinence treated with Flomax and Gemtesa.  Residual was 106 mL.  He was given Myrbetriq last visit.  He failed Myrbetriq and culture was negative  Patient has high-volume urge incontinence and high-volume bedwetting.  He actually does not get up to urinate and uses pads.  He can wear 6 pads a day moderately wet or soaked.  He voids every 1-2 hours and has no nocturia.  He has had Parkinson's for many years.  His wife thinks he gets multiple bladder infections with dysuria and just finished an antibiotic on Friday.  The last 2 cultures have been negative but he has had positive cultures.  He tends toward significant constipation  Urine looked infected today       PMH: Past Medical History:  Diagnosis Date   AAA (abdominal aortic aneurysm) ([place])    Alcohol dependence in remission ([place])    Cancer ([place])    skin    Colon polyps    ED (erectile dysfunction)    GERD (gastroesophageal reflux disease)    Hematest positive stools    Melanoma of skin ([facility])    Parkinson's disease    Renal cyst, right    Restless leg syndrome     Surgical History: Past Surgical History:  Procedure Laterality Date   CHOLECYSTECTOMY     COLON SURGERY     colectomy with anastamosis   COLONOSCOPY WITH PROPOFOL N/A 11/07/2016   Procedure: COLONOSCOPY WITH PROPOFOL;  Surgeon: Lollie Sails, MD;  Location: [facility] ENDOSCOPY;  Service: Endoscopy;  Laterality: N/A;   COLONOSCOPY WITH PROPOFOL N/A 09/06/2018   Procedure: COLONOSCOPY WITH PROPOFOL;  Surgeon: Lollie Sails, MD;  Location: [facility] ENDOSCOPY;  Service: Endoscopy;  Laterality: N/A;   COLONOSCOPY  WITH PROPOFOL N/A 05/16/2019   Procedure: COLONOSCOPY WITH PROPOFOL;  Surgeon: Lollie Sails, MD;  Location: [facility] ENDOSCOPY;  Service: Endoscopy;  Laterality: N/A;   CYST EXCISION     vocal cord   DBS placement     deep brain implant     ESOPHAGOGASTRODUODENOSCOPY (EGD) WITH PROPOFOL N/A 09/06/2018   Procedure: ESOPHAGOGASTRODUODENOSCOPY (EGD) WITH PROPOFOL;  Surgeon: Lollie Sails, MD;  Location: [facility] ENDOSCOPY;  Service: Endoscopy;  Laterality: N/A;   MELANOMA EXCISION     TONSILLECTOMY      Home Medications:  Allergies as of 07/04/2022       Reactions   Artane [trihexyphenidyl] Anxiety   Prochlorperazine Nausea And Vomiting   Propofol Other (See Comments)   Delirium Delirium Delirium Delirium        Medication List        Accurate as of July 04, 2022  2:33 PM. If you have any questions, ask your nurse or doctor.          carbidopa-levodopa 25-100 MG tablet Commonly known as: SINEMET IR Take 2 tablets by mouth 4 (four) times daily.   donepezil 10 MG tablet Commonly known as: ARICEPT Take 10 mg by mouth daily.   Elderberry Zinc/Vit C/Immune Lozg Use as directed 1 lozenge in the mouth or throat daily.   melatonin 3 MG Tabs tablet Take 9 mg by mouth at bedtime.  mirabegron ER 50 MG Tb24 tablet Commonly known as: MYRBETRIQ Take 1 tablet (50 mg total) by mouth daily.   multivitamin tablet Take 1 tablet by mouth daily.   pantoprazole 40 MG tablet Commonly known as: PROTONIX Take 40 mg by mouth daily.   polyethylene glycol 17 g packet Commonly known as: MIRALAX / GLYCOLAX Take 17 g by mouth daily as needed for mild constipation or moderate constipation.   QUEtiapine 25 MG tablet Commonly known as: SEROQUEL Take 75 mg by mouth at bedtime.   sertraline 100 MG tablet Commonly known as: ZOLOFT Take 100 mg by mouth daily.   tamsulosin 0.4 MG Caps capsule Commonly known as: FLOMAX Take 0.4 mg by mouth daily.        Allergies:   Allergies  Allergen Reactions   Artane [Trihexyphenidyl] Anxiety   Prochlorperazine Nausea And Vomiting   Propofol Other (See Comments)    Delirium Delirium Delirium Delirium     Family History: Family History  Problem Relation Age of Onset   Diabetes Father     Social History:  reports that he has quit smoking. He has never used smokeless tobacco. He reports current alcohol use of about 6.0 standard drinks of alcohol per week. He reports that he does not currently use drugs.  ROS:                                        Physical Exam: BP (!) 146/103   Pulse (!) 59   Ht '5\' 7"'$  (1.702 m)   Wt 77.1 kg   BMI 26.63 kg/m   Constitutional:  Alert and oriented, No acute distress. HEENT: [place] AT, moist mucus membranes.  Trachea midline, no masses.   Laboratory Data: Lab Results  Component Value Date   WBC 9.8 05/02/2022   HGB 13.0 (A) 05/02/2022   HCT 38 (A) 05/02/2022   MCV 92.1 04/25/2022   PLT 266 05/02/2022    Lab Results  Component Value Date   CREATININE 1.2 05/02/2022    No results found for: "PSA"  No results found for: "TESTOSTERONE"  No results found for: "HGBA1C"  Urinalysis    Component Value Date/Time   COLORURINE STRAW (A) 04/22/2022 0805   APPEARANCEUR Clear 05/25/2022 1354   LABSPEC 1.004 (L) 04/22/2022 0805   PHURINE 6.0 04/22/2022 0805   GLUCOSEU Negative 05/25/2022 1354   HGBUR NEGATIVE 04/22/2022 0805   BILIRUBINUR Negative 05/25/2022 1354   KETONESUR NEGATIVE 04/22/2022 0805   PROTEINUR Negative 05/25/2022 1354   PROTEINUR NEGATIVE 04/22/2022 0805   NITRITE Negative 05/25/2022 1354   NITRITE NEGATIVE 04/22/2022 0805   LEUKOCYTESUR 1+ (A) 05/25/2022 1354   LEUKOCYTESUR NEGATIVE 04/22/2022 0805    Pertinent Imaging: Urine looked infected and sent for culture  Assessment & Plan: Patient has urge incontinence and neurogenic bladder.  I am not convinced that urodynamics would change the management.  He had red  blood cells in the urine today but it looked infected.  Recognizing potential limitations I decided to give him ciprofloxacin 500 mg twice a day for 7 days and put him on trimethoprim 100 mg daily 30x11.  Have him come back and see if he down regulate in about 6 weeks for cystoscopy.  Antimuscarinics may not be ideal due to Parkinson's.  Not a good candidate for InterStim or Botox but good candidate.  For percutaneous tibial nerve stimulation.  Call if culture differs  The [facility] was working for short while and perhaps he failed because of infections.  Patient had a normal renal ultrasound in October 2020  1. Urge incontinence of urine  - Urinalysis, Complete   No follow-ups on file.  Reece Packer, MD  [facility] [address] [place], [place] [address] [phone]

## 2022-07-05 ENCOUNTER — Ambulatory Visit: Payer: Medicare HMO

## 2022-07-05 DIAGNOSIS — R262 Difficulty in walking, not elsewhere classified: Secondary | ICD-10-CM | POA: Diagnosis not present

## 2022-07-05 DIAGNOSIS — R293 Abnormal posture: Secondary | ICD-10-CM

## 2022-07-05 NOTE — Therapy (Signed)
OUTPATIENT PHYSICAL THERAPY TREATMENT NOTE Re-certification (for today's visit) And Discharge Summary    Patient Name: Wesley Anderson. MRN: [id number] DOB:May 11, 1946, 76 y.o., male Today's Date: 07/05/2022  PCP: Wesley Lab III, MD REFERRING PROVIDER: Ramonita Lab III, MD   PT End of Session - 07/05/22 1618     Visit Number 32    Number of Visits 53    Date for PT Re-Evaluation 07/05/22    Authorization Type 2    Authorization Time Period 10 progress report    PT Start Time 1618    PT Stop Time 1703    PT Time Calculation (min) 45 min    Equipment Utilized During Treatment Gait belt   RW   Activity Tolerance Patient tolerated treatment well    Behavior During Therapy WFL for tasks assessed/performed                                    Past Medical History:  Diagnosis Date   AAA (abdominal aortic aneurysm) ([place])    Alcohol dependence in remission ([place])    Cancer ([place])    skin    Colon polyps    ED (erectile dysfunction)    GERD (gastroesophageal reflux disease)    Hematest positive stools    Melanoma of skin ([facility])    Parkinson's disease    Renal cyst, right    Restless leg syndrome    Past Surgical History:  Procedure Laterality Date   CHOLECYSTECTOMY     COLON SURGERY     colectomy with anastamosis   COLONOSCOPY WITH PROPOFOL N/A 11/07/2016   Procedure: COLONOSCOPY WITH PROPOFOL;  Surgeon: Lollie Sails, MD;  Location: [facility] ENDOSCOPY;  Service: Endoscopy;  Laterality: N/A;   COLONOSCOPY WITH PROPOFOL N/A 09/06/2018   Procedure: COLONOSCOPY WITH PROPOFOL;  Surgeon: Lollie Sails, MD;  Location: [facility] ENDOSCOPY;  Service: Endoscopy;  Laterality: N/A;   COLONOSCOPY WITH PROPOFOL N/A 05/16/2019   Procedure: COLONOSCOPY WITH PROPOFOL;  Surgeon: Lollie Sails, MD;  Location: [facility] ENDOSCOPY;  Service: Endoscopy;  Laterality: N/A;   CYST EXCISION     vocal cord   DBS placement     deep brain implant      ESOPHAGOGASTRODUODENOSCOPY (EGD) WITH PROPOFOL N/A 09/06/2018   Procedure: ESOPHAGOGASTRODUODENOSCOPY (EGD) WITH PROPOFOL;  Surgeon: Lollie Sails, MD;  Location: [facility] ENDOSCOPY;  Service: Endoscopy;  Laterality: N/A;   MELANOMA EXCISION     TONSILLECTOMY     Patient Active Problem List   Diagnosis Date Noted   Chronic systolic (congestive) heart failure ([place]) 04/28/2022   COVID-19 virus infection 04/22/2022   Depression 04/22/2022   Acute gastroenteritis 08/08/2021   Sepsis ([place]) 08/08/2021   Confusion    Anemia 07/09/2021   Weakness 07/09/2021   Fever and chills 07/09/2021   Myalgia 07/09/2021   Influenza A 79/15/0569   Acute metabolic encephalopathy 79/48/0165   AKI (acute kidney injury) ([place]) 07/09/2020   Leukocytosis 07/09/2020   S/P deep brain stimulator placement 07/09/2020   Squamous cell carcinoma, 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 04/24/2020   Heme positive stool 05/27/2016   Alcohol dependence in remission ([place]) 07/31/2013   Cognitive impairment 02/22/2013   History of malignant melanoma of skin 05/15/2012   Parkinson's disease 09/28/2011    REFERRING DIAG: Unsteadiness on feet, history of falls  THERAPY DIAG:  Difficulty in walking, not elsewhere classified - Plan: PT plan of care cert/re-cert  Abnormal  posture - Plan: PT plan of care cert/re-cert  PERTINENT HISTORY: Wesley Anderson is a 76yoM who is referred to [facility] OPPT for on-going imbalance in the setting of Parkinson's Disease. Pt was an active patient here in Nov 2022, but DC due to hospital admission for Flu in November and another due to encephalopathy in December. Pt working with HHPT/OT in the interim. Pt reports no falls in the past several months, had made efforts to be as safe as possible  PRECAUTIONS: Fall risk  SUBJECTIVE: Doing ok. No falls recently. No pain currently  PAIN:  Are you having pain? 0/10     TODAY'S TREATMENT:  Neuromuscular re-education   CGA to SBA with standing  tasks  Gait x 6 minutes with rw, min cues for increasing hip and knee flexion for foot clearance. Improved step length, with very min to no cues for increasing step length.   Directed patient with gait with normal gait speed, with changes in speed, 180 degree pivot turn, with R and L cervical rotation position, with cervical flexion and extension position, stepping around obstacles, stepping over an obstacle, ascending and descending 4 regular steps with UE assist   Seated with feet propped on stool to promote hamstring stretch and knee extension to promote upright posture x 2 minutes  Standing hip abduction with B UE assist 10x5 seconds each LE  Gait without AD but with 2 lbs ankle weight 200 ft CGA. Mod to max cues for increasing step length and foot clearance.     SLS with contralateral UE assist   R 10x5 seconds for 2 sets   L 10x5 seconds for 2 sets   Cues for large movements Improved exercise technique, movement at target joints, use of target muscles after mod verbal, visual, tactile cues.     Response to treatment Pt tolerated session well without aggravation of symptoms.     Clinical impression  Pt demonstrates improved overall function, balance, ability to ambulate (improved overall foot clearance and step length) and functional LE strength since initial evaluation. Pt has made very good progress with PT towards goals and has been able to ambulate at least 200 ft without use of AD in the clinic. Pt however continues need for rw secondary to tendency to return to festinating gait pattern. Challenges to progress include progressive nature of Parkinson's. Pt tolerated session well without aggravation of symptoms. Skilled physical therapy services discharged after recert for today's visit with pt continuing with his exercises at home.         PATIENT EDUCATION: Education details: ther-ex, HEP Person educated: Patient Education method: Explanation, Demonstration,  Tactile cues, Verbal cues, and Handouts Education comprehension: verbalized understanding and returned demonstration   HOME EXERCISE PROGRAM: Access Code: [email or web address] URL: [email or web address] Date: 12/29/2021 Prepared by: Joneen Boers  Exercises - Standing Hip Abduction with Counter Support  - 1 x daily - 7 x weekly - 2 sets - 10 reps - 5 seconds hold - Sit to Stand with Counter Support  - 1 x daily - 7 x weekly - 2 sets - 5 reps      PT Short Term Goals - 06/23/22 1353       PT SHORT TERM GOAL #1   Title Patient will demonstrate independence with home exercise program for ability to self treat.    Baseline Has been doing his home exercises somewhat. No questions. (02/16/2022); 03/07/22: Performs every other day; Performing his HEP, no questions, seems to be doing him some good per pt (  03/23/2022)    Time 2    Period Weeks    Status Achieved    Target Date 03/21/22      PT SHORT TERM GOAL #2   Title 6MWT c LRAD >837f -> (+) IADL;    Baseline 3024feval; 500 ft with rw, CGA, cues for increasing foot clearance and step length and staying closer to the rw (02/16/2022): 03/07/2022: 945' with RW; 312 ft with rw (05/03/2022); 866 ft with rw (06/23/2022)    Time 4    Period Weeks    Status Achieved    Target Date 06/02/22      PT SHORT TERM GOAL #3   Title FOTO>10% increase    Baseline Eval: 48; 03/07/22: 46; 50 (03/23/2022)    Time 4    Period Weeks    Status On-going    Target Date 06/02/22      PT SHORT TERM GOAL #4   Title 5xSTS<13sec s UE chair + airex pad.    Baseline Eval 15sec; 11 seconds with cues for increased speed (02/16/2022); 20.6 seconds with B UE assist, no Air Ex pad (05/03/2022); 11.40 seconds (06/23/2022)    Time 6    Period Weeks    Status Achieved    Target Date 06/02/22              PT Long Term Goals - 07/05/22 1627       PT LONG TERM GOAL #1   Title Patient will have improved function and activity level as evidenced by an increase in FOTO  score by 13 points or more.    Baseline Eval 48; 03/07/22: 46; 50 (03/23/2022); 60 (06/23/2022)    Time 8    Period Weeks    Status On-going    Target Date 06/30/22      PT LONG TERM GOAL #2   Title 6MWT >120058f LRAD    Baseline Eval: [id number] [id number]' with RW completing full 6 minutes; 312 ft with rw (05/03/2022); 866 ft with rw (06/23/2022); 900 ft with rw (07/05/2022)    Time 8    Period Weeks    Status On-going    Target Date 06/30/22      PT LONG TERM GOAL #3   Title Pt will improve his DGI score with rw to > 19 points as a demonstration of decreased fall risk.    Baseline DGI with rw: 15 (01/03/2022); 03/07/22: 16 relying on RW; 15, did not use RW today (03/23/2022); 9 with rw (05/03/2022); no AD 19 pionts (06/14/2022)    Time 8    Period Weeks    Status Partially Met    Target Date 06/30/22      PT LONG TERM GOAL #4   Title 5xSTS<15sec from chair, s BUE    Baseline eval: requires arms from chair height; 03/07/22: 13.32 sec without UE support; 20.6 seconds wiht B UE assist (05/03/2022); 12.86 seconds (06/23/2022)    Time 10    Period Weeks    Status Achieved    Target Date 06/30/22              Plan - 07/05/22 1613     Clinical Impression Statement Pt demonstrates improved overall function, balance, ability to ambulate (improved overall foot clearance and step length) and functional LE strength since initial evaluation. Pt has made very good progress with PT towards goals and has been able to ambulate at least 200 ft without use of AD in the clinic. Pt however  continues need for rw secondary to tendency to return to festinating gait pattern. Challenges to progress include progressive nature of Parkinson's. Pt tolerated session well without aggravation of symptoms. Skilled physical therapy services discharged after recert for today's visit with pt continuing with his exercises at home.    Personal Factors and Comorbidities Age;Past/Current Experience    Comorbidities PD, H/o  cancer,    Examination-Activity Limitations Squat;Stairs;Stand;Locomotion Level;Reach Overhead    Examination-Participation Restrictions Cleaning;Medication Management;Interpersonal Relationship;Community Activity    Stability/Clinical Decision Making Stable/Uncomplicated   regression in PT progress from [place]    PT Frequency One time visit    PT Duration --    PT Treatment/Interventions Neuromuscular re-education;Balance training;Therapeutic exercise;Orthotic Fit/Training;Patient/family education;Manual techniques;Gait training;Therapeutic activities;Functional mobility training;Stair training    PT Next Visit Plan Continue with his exercises at home    PT [place] Access Code: [email or web address]    Consulted and Agree with Plan of Care Patient                                Thank you for your referral.   Joneen Boers PT, DPT  07/05/2022, 5:23 PM

## 2022-07-07 LAB — CULTURE, URINE COMPREHENSIVE

## 2022-08-29 ENCOUNTER — Other Ambulatory Visit: Payer: Medicare HMO | Admitting: Urology

## 2022-08-29 ENCOUNTER — Encounter: Payer: Self-pay | Admitting: Urology

## 2022-08-29 ENCOUNTER — Ambulatory Visit: Payer: Medicare HMO | Admitting: Urology

## 2022-08-29 VITALS — BP 103/64 | HR 76 | Ht 67.0 in | Wt 173.0 lb

## 2022-08-29 DIAGNOSIS — R109 Unspecified abdominal pain: Secondary | ICD-10-CM

## 2022-08-29 DIAGNOSIS — N3941 Urge incontinence: Secondary | ICD-10-CM

## 2022-08-29 DIAGNOSIS — N210 Calculus in bladder: Secondary | ICD-10-CM | POA: Diagnosis not present

## 2022-08-29 LAB — MICROSCOPIC EXAMINATION: WBC, UA: >30 /hpf — AB (ref 0–5)

## 2022-08-29 LAB — URINALYSIS, COMPLETE
Bilirubin, UA: NEGATIVE
Glucose, UA: NEGATIVE
Ketones, UA: NEGATIVE
Nitrite, UA: NEGATIVE
Specific Gravity, UA: 1.02 (ref 1.005–1.030)
Urobilinogen, Ur: 0.2 mg/dL (ref 0.2–1.0)
pH, UA: 6.5 (ref 5.0–7.5)

## 2022-08-29 MED ORDER — NITROFURANTOIN MACROCRYSTAL 100 MG PO CAPS: 100.0000 mg | ORAL_CAPSULE | Freq: Four times a day (QID) | ORAL | 11 refills | Status: DC

## 2022-08-29 MED ORDER — CIPROFLOXACIN HCL 250 MG PO TABS: 250.0000 mg | ORAL_TABLET | Freq: Two times a day (BID) | ORAL | 0 refills | Status: DC

## 2022-08-29 NOTE — Progress Notes (Signed)
08/29/2022 12:54 PM   [name] 29-Aug-1945 [id number]  Referring provider: Adin Hector, MD [facility] [place],  [place] [address]  Chief Complaint  Patient presents with   Cysto    HPI: Patient has high-volume urge incontinence and high-volume bedwetting.  He actually does not get up to urinate and uses pads.  He can wear 6 pads a day moderately wet or soaked.  Last visit he was given Myrbetriq.  Logan Bores had initially worked and then stopped working.  He had been on Flomax   He voids every 1-2 hours and has no nocturia.   He has had Parkinson's for many years.  His wife thinks he gets multiple bladder infections with dysuria and just finished an antibiotic on Friday.  The last 2 cultures have been negative but he has had positive cultures.   He tends toward significant constipation   Urine looked infected today    Patient has urge incontinence and neurogenic bladder.  I am not convinced that urodynamics would change the management.  He had red blood cells in the urine today but it looked infected.  Recognizing potential limitations I decided to give him ciprofloxacin 500 mg twice a day for 7 days and put him on trimethoprim 100 mg daily 30x11.  Have him come back and see if he down regulate in about 6 weeks for cystoscopy.  Antimuscarinics may not be ideal due to Parkinson's.  Not a good candidate for InterStim or Botox but good candidate.  For percutaneous tibial nerve stimulation.  Call if culture differs   The Logan Bores was working for short while and perhaps he failed because of infections.  Patient had a normal renal ultrasound in October 2020  Today Last culture positive sensitive to ciprofloxacin and Macrodantin.  He still has some burning.  Urine looked positive.  He still leaking a lot on Gemtesa and on trimethoprim  Cystoscopy: Patient underwent flexible cystoscopy.  Penile bulbar urethra normal.  He had bilobar enlargement of  the prostate.  He had a little bit of elevated bladder neck.  Urine was very cloudy with white flecks and sediment.  It was difficult to see the urothelium throughout the bladder but it looked erythematous.  He appeared to have at least a 2 cm stone in the midline and visibility was modest at best   PMH: Past Medical History:  Diagnosis Date   AAA (abdominal aortic aneurysm) (HCC)    Alcohol dependence in remission ([place])    Cancer ([place])    skin    Colon polyps    ED (erectile dysfunction)    GERD (gastroesophageal reflux disease)    Hematest positive stools    Melanoma of skin (HCC)    Parkinson's disease    Renal cyst, right    Restless leg syndrome     Surgical History: Past Surgical History:  Procedure Laterality Date   CHOLECYSTECTOMY     COLON SURGERY     colectomy with anastamosis   COLONOSCOPY WITH PROPOFOL N/A 11/07/2016   Procedure: COLONOSCOPY WITH PROPOFOL;  Surgeon: Lollie Sails, MD;  Location: [facility] ENDOSCOPY;  Service: Endoscopy;  Laterality: N/A;   COLONOSCOPY WITH PROPOFOL N/A 09/06/2018   Procedure: COLONOSCOPY WITH PROPOFOL;  Surgeon: Lollie Sails, MD;  Location: [facility] ENDOSCOPY;  Service: Endoscopy;  Laterality: N/A;   COLONOSCOPY WITH PROPOFOL N/A 05/16/2019   Procedure: COLONOSCOPY WITH PROPOFOL;  Surgeon: Lollie Sails, MD;  Location: [facility] ENDOSCOPY;  Service:  Endoscopy;  Laterality: N/A;   CYST EXCISION     vocal cord   DBS placement     deep brain implant     ESOPHAGOGASTRODUODENOSCOPY (EGD) WITH PROPOFOL N/A 09/06/2018   Procedure: ESOPHAGOGASTRODUODENOSCOPY (EGD) WITH PROPOFOL;  Surgeon: Lollie Sails, MD;  Location: [facility] ENDOSCOPY;  Service: Endoscopy;  Laterality: N/A;   MELANOMA EXCISION     TONSILLECTOMY      Home Medications:  Allergies as of 08/29/2022       Reactions   Artane [trihexyphenidyl] Anxiety   Prochlorperazine Nausea And Vomiting   Propofol Other (See Comments)   Delirium Delirium Delirium Delirium         Medication List        Accurate as of August 29, 2022 12:54 PM. If you have any questions, ask your nurse or doctor.          carbidopa-levodopa 25-100 MG tablet Commonly known as: SINEMET IR Take 2 tablets by mouth 4 (four) times daily.   donepezil 10 MG tablet Commonly known as: ARICEPT Take 10 mg by mouth daily.   Elderberry Zinc/Vit C/Immune Lozg Use as directed 1 lozenge in the mouth or throat daily.   melatonin 3 MG Tabs tablet Take 9 mg by mouth at bedtime.   mirabegron ER 50 MG Tb24 tablet Commonly known as: MYRBETRIQ Take 1 tablet (50 mg total) by mouth daily.   multivitamin tablet Take 1 tablet by mouth daily.   pantoprazole 40 MG tablet Commonly known as: PROTONIX Take 40 mg by mouth daily.   polyethylene glycol 17 g packet Commonly known as: MIRALAX / GLYCOLAX Take 17 g by mouth daily as needed for mild constipation or moderate constipation.   QUEtiapine 25 MG tablet Commonly known as: SEROQUEL Take 75 mg by mouth at bedtime.   sertraline 100 MG tablet Commonly known as: ZOLOFT Take 100 mg by mouth daily.   tamsulosin 0.4 MG Caps capsule Commonly known as: FLOMAX Take 0.4 mg by mouth daily.   trimethoprim 100 MG tablet Commonly known as: TRIMPEX Take 1 tablet (100 mg total) by mouth daily.        Allergies:  Allergies  Allergen Reactions   Artane [Trihexyphenidyl] Anxiety   Prochlorperazine Nausea And Vomiting   Propofol Other (See Comments)    Delirium Delirium Delirium Delirium     Family History: Family History  Problem Relation Age of Onset   Diabetes Father     Social History:  reports that he has quit smoking. He has never used smokeless tobacco. He reports current alcohol use of about 6.0 standard drinks of alcohol per week. He reports that he does not currently use drugs.  ROS:                                        Physical Exam: There were no vitals taken for this visit.   Constitutional:  Alert and oriented, No acute distress. HEENT: [place] AT, moist mucus membranes.  Trachea midline, no masses.   Laboratory Data: Lab Results  Component Value Date   WBC 9.8 05/02/2022   HGB 13.0 (A) 05/02/2022   HCT 38 (A) 05/02/2022   MCV 92.1 04/25/2022   PLT 266 05/02/2022    Lab Results  Component Value Date   CREATININE 1.2 05/02/2022    No results found for: "PSA"  No results found for: "TESTOSTERONE"  No results found for: "  HGBA1C"  Urinalysis    Component Value Date/Time   COLORURINE STRAW (A) 04/22/2022 0805   APPEARANCEUR Clear 07/04/2022 1422   LABSPEC 1.004 (L) 04/22/2022 0805   PHURINE 6.0 04/22/2022 0805   GLUCOSEU Negative 07/04/2022 1422   HGBUR NEGATIVE 04/22/2022 0805   BILIRUBINUR Negative 07/04/2022 1422   KETONESUR NEGATIVE 04/22/2022 0805   PROTEINUR Negative 07/04/2022 1422   PROTEINUR NEGATIVE 04/22/2022 0805   NITRITE Negative 07/04/2022 1422   NITRITE NEGATIVE 04/22/2022 0805   LEUKOCYTESUR 1+ (A) 07/04/2022 1422   LEUKOCYTESUR NEGATIVE 04/22/2022 0805    Pertinent Imaging:   Assessment & Plan: Aced on last culture I will call in ciprofloxacin 250 mg twice a day for 7 days and put him on daily Macrodantin 100 mg.  I will get CT stone protocol and he will follow-up with Dr. Bernardo Heater and likely will need a transurethral removal of bladder stone.  If we can sterilize his urine he may do well with the medication such as Gemtesa.  He will need bladder stone removed to sterilize his urine.  He will need a postvoid residual urine volume in the future.  His residual May 25, 2022 was 106 mL.  I think is very reasonable to go back and try Gemtesa after he sterilized  1. Urge incontinence of urine  - Urinalysis, Complete   No follow-ups on file.  Reece Packer, MD  [facility] [address] [place], [place] [address] [phone]

## 2022-09-01 ENCOUNTER — Telehealth: Payer: Self-pay | Admitting: Urology

## 2022-09-01 LAB — CULTURE, URINE COMPREHENSIVE

## 2022-09-01 NOTE — Telephone Encounter (Signed)
Patient wife states his urinary are better because he is on a antibiotic.   No fever or chills  just some off and on side pain . Pain level 2 at the most per patient . They will be waiting for the ct scan people to call them .They will call back if pain starts to be worst

## 2022-09-01 NOTE — Telephone Encounter (Signed)
Patient's wife called regarding lower right side pain that started today. He had procedure with Dr. Matilde Sprang on Monday 1/15. They are wondering if this could be related to the procedure, or from his kidney stone, or infection. They are still waiting to get his CT scheduled also.

## 2022-09-02 NOTE — Addendum Note (Signed)
Addended by: Kyra Manges on: 09/02/2022 04:13 PM   Modules accepted: Orders

## 2022-09-05 ENCOUNTER — Encounter (INDEPENDENT_AMBULATORY_CARE_PROVIDER_SITE_OTHER): Payer: Medicare HMO | Admitting: Vascular Surgery

## 2022-09-05 ENCOUNTER — Other Ambulatory Visit: Payer: Medicare HMO | Admitting: Urology

## 2022-09-06 ENCOUNTER — Telehealth: Payer: Self-pay | Admitting: Urology

## 2022-09-06 ENCOUNTER — Encounter: Payer: Self-pay | Admitting: Urology

## 2022-09-06 NOTE — Telephone Encounter (Signed)
Pt wife called, Wesley Anderson, for the antibiotics she was wondering when to give medication? Breakfast, Lunch, Dinner? Takes vitamins w/Magnesium in it. Also takes an probiotic. Pt wife needs clarification as to when to take meds. and antiacid.  Please advise  [phone].

## 2022-09-06 NOTE — Telephone Encounter (Signed)
Pt's wife confused about macrobid rx, the script was written for 4 times and it should be once daily per last OV. Wife was also asking when to take the macrobid. I advised that bedtime is fine.

## 2022-09-07 ENCOUNTER — Ambulatory Visit
Admission: RE | Admit: 2022-09-07 | Discharge: 2022-09-07 | Disposition: A | Discharge status: Home / Self Care | Payer: Medicare HMO | Source: Ambulatory Visit | Attending: Urology | Admitting: Urology

## 2022-09-07 DIAGNOSIS — N210 Calculus in bladder: Secondary | ICD-10-CM

## 2022-09-07 DIAGNOSIS — I890 Lymphedema, not elsewhere classified: Secondary | ICD-10-CM | POA: Insufficient documentation

## 2022-09-07 DIAGNOSIS — R109 Unspecified abdominal pain: Secondary | ICD-10-CM

## 2022-09-07 DIAGNOSIS — N3941 Urge incontinence: Secondary | ICD-10-CM

## 2022-09-07 NOTE — Progress Notes (Signed)
MRN : [id number]  Wesley Anderson. is a 77 y.o. (1946-05-28) male who presents with chief complaint of legs swell.  History of Present Illness:   Patient is seen for evaluation of leg swelling. The right leg is much more affected than the left.  The patient first noticed the swelling remotely but is now concerned because of a significant increase in the overall edema. The swelling isn't associated with significant pain.  There has been an increasing amount of  discoloration noted by the patient. The patient notes that in the morning the legs are improved but they steadily worsened throughout the course of the day. Elevation seems to make the swelling of the legs better, dependency makes them much worse.   There is no history of ulcerations associated with the swelling.   The patient denies any recent changes in their medications.  The patient has not been wearing graduated compression.  The patient has no had any past angiography, interventions or vascular surgery.  The patient denies a history of DVT or PE. There is no prior history of phlebitis. There is no history of primary lymphedema.  There is no history of radiation treatment to the groin or pelvis No history of malignancies. No history of trauma or groin or pelvic surgery. No history of foreign travel or parasitic infections area.  Duplex ultrasound of the venous system shows normal neep system bilaterally, no superficial reflux identified.   No outpatient medications have been marked as taking for the 09/08/22 encounter (Appointment) with Delana Meyer, Dolores Lory, MD.    Past Medical History:  Diagnosis Date   AAA (abdominal aortic aneurysm) ([place])    Alcohol dependence in remission ([place])    Cancer ([place])    skin    Colon polyps    ED (erectile dysfunction)    GERD (gastroesophageal reflux disease)    Hematest positive stools    Melanoma of skin (HCC)    Parkinson's disease    Renal cyst, right    Restless leg  syndrome     Past Surgical History:  Procedure Laterality Date   CHOLECYSTECTOMY     COLON SURGERY     colectomy with anastamosis   COLONOSCOPY WITH PROPOFOL N/A 11/07/2016   Procedure: COLONOSCOPY WITH PROPOFOL;  Surgeon: Lollie Sails, MD;  Location: [facility] ENDOSCOPY;  Service: Endoscopy;  Laterality: N/A;   COLONOSCOPY WITH PROPOFOL N/A 09/06/2018   Procedure: COLONOSCOPY WITH PROPOFOL;  Surgeon: Lollie Sails, MD;  Location: [facility] ENDOSCOPY;  Service: Endoscopy;  Laterality: N/A;   COLONOSCOPY WITH PROPOFOL N/A 05/16/2019   Procedure: COLONOSCOPY WITH PROPOFOL;  Surgeon: Lollie Sails, MD;  Location: [facility] ENDOSCOPY;  Service: Endoscopy;  Laterality: N/A;   CYST EXCISION     vocal cord   DBS placement     deep brain implant     ESOPHAGOGASTRODUODENOSCOPY (EGD) WITH PROPOFOL N/A 09/06/2018   Procedure: ESOPHAGOGASTRODUODENOSCOPY (EGD) WITH PROPOFOL;  Surgeon: Lollie Sails, MD;  Location: [facility] ENDOSCOPY;  Service: Endoscopy;  Laterality: N/A;   MELANOMA EXCISION     TONSILLECTOMY      Social History Social History   Tobacco Use   Smoking status: Former   Smokeless tobacco: Never  Scientific laboratory technician Use: Never used  Substance Use Topics   Alcohol use: Yes    Alcohol/week: 6.0 standard drinks of alcohol    Types: 3 Shots of liquor, 3 Standard drinks or equivalent per week   Drug use: Not Currently  Family History Family History  Problem Relation Age of Onset   Diabetes Father     Allergies  Allergen Reactions   Artane [Trihexyphenidyl] Anxiety   Prochlorperazine Nausea And Vomiting   Propofol Other (See Comments)    Delirium Delirium Delirium Delirium      REVIEW OF SYSTEMS (Negative unless checked)  Constitutional: '[]'$ Weight loss  '[]'$ Fever  '[]'$ Chills Cardiac: '[]'$ Chest pain   '[]'$ Chest pressure   '[]'$ Palpitations   '[]'$ Shortness of breath when laying flat   '[]'$ Shortness of breath with exertion. Vascular:  '[]'$ Pain in legs with walking   '[x]'$ Pain in legs  with standing  '[]'$ History of DVT   '[]'$ Phlebitis   '[x]'$ Swelling in legs   '[]'$ Varicose veins   '[]'$ Non-healing ulcers Pulmonary:   '[]'$ Uses home oxygen   '[]'$ Productive cough   '[]'$ Hemoptysis   '[]'$ Wheeze  '[]'$ COPD   '[]'$ Asthma Neurologic:  '[]'$ Dizziness   '[]'$ Seizures   '[]'$ History of stroke   '[]'$ History of TIA  '[]'$ Aphasia   '[]'$ Vissual changes   '[]'$ Weakness or numbness in arm   '[]'$ Weakness or numbness in leg Musculoskeletal:   '[]'$ Joint swelling   '[]'$ Joint pain   '[]'$ Low back pain Hematologic:  '[]'$ Easy bruising  '[]'$ Easy bleeding   '[]'$ Hypercoagulable state   '[]'$ Anemic Gastrointestinal:  '[]'$ Diarrhea   '[]'$ Vomiting  '[]'$ Gastroesophageal reflux/heartburn   '[]'$ Difficulty swallowing. Genitourinary:  '[]'$ Chronic kidney disease   '[]'$ Difficult urination  '[]'$ Frequent urination   '[]'$ Blood in urine Skin:  '[]'$ Rashes   '[]'$ Ulcers  Psychological:  '[]'$ History of anxiety   '[]'$  History of major depression.  Physical Examination  There were no vitals filed for this visit. There is no height or weight on file to calculate BMI. Gen: WD/WN, NAD; seen in wheelchair Head: [place]/AT, No temporalis wasting.  Ear/Nose/Throat: Hearing grossly intact, nares w/o erythema or drainage, pinna without lesions Eyes: PER, EOMI, sclera nonicteric.  Neck: Supple, no gross masses.  No JVD.  Pulmonary:  Good air movement, no audible wheezing, no use of accessory muscles.  Cardiac: RRR, precordium not hyperdynamic. Vascular:  scattered varicosities present bilaterally.  Mild venous stasis changes to the legs bilaterally.  3-4+ soft pitting edema right > left, CEAP [id number] right > left Vessel Right Left  Radial Palpable Palpable  Gastrointestinal: soft, non-distended. No guarding/no peritoneal signs.  Musculoskeletal: M/S 5/5 throughout.  No deformity.  Neurologic: CN 2-12 intact. Pain and light touch intact in extremities.  Symmetrical.  Speech is fluent. Motor exam as listed above. Psychiatric: Judgment intact, Mood & affect appropriate for pt's clinical situation. Dermatologic:  Venous rashes no ulcers noted.  No changes consistent with cellulitis. Lymph : No lichenification or skin changes of chronic lymphedema.  CBC Lab Results  Component Value Date   WBC 9.8 05/02/2022   HGB 13.0 (A) 05/02/2022   HCT 38 (A) 05/02/2022   MCV 92.1 04/25/2022   PLT 266 05/02/2022    BMET    Component Value Date/Time   NA 140 05/02/2022 0000   K 4.2 05/02/2022 0000   CL 106 05/02/2022 0000   CO2 23 (A) 05/02/2022 0000   GLUCOSE 96 04/26/2022 0451   BUN 20 05/02/2022 0000   CREATININE 1.2 05/02/2022 0000   CREATININE 1.22 04/26/2022 0451   CALCIUM 9.8 05/02/2022 0000   GFRNONAA >60 04/26/2022 0451   CrCl cannot be calculated (Patient's most recent lab result is older than the maximum 21 days allowed.).  COAG Lab Results  Component Value Date   INR 1.3 (H) 08/08/2021    Radiology No results found.   Assessment/Plan 1. Pain and  swelling of lower leg, unspecified laterality Recommend:  I believe he will need a lymph pump.  I have had a long discussion with the patient regarding swelling and why it  causes symptoms.  Patient will begin wearing graduated compression on a daily basis a prescription was given. The patient will  wear the stockings first thing in the morning and removing them in the evening. The patient is instructed specifically not to sleep in the stockings.   In addition, behavioral modification will be initiated.  This will include frequent elevation, use of over the counter pain medications and exercise as tolerated given the weakness of his legs.  Consideration for a lymph pump will also be made based upon the effectiveness of conservative therapy.  This would help to improve the edema control and prevent sequela such as ulcers and infections   Patient should undergo duplex ultrasound of the venous system to ensure that DVT or reflux is not present.  The patient will follow-up with me after the ultrasound.   2. Lymphedema Recommend:  I  believe he will need a lymph pump.  I have had a long discussion with the patient regarding swelling and why it  causes symptoms.  Patient will begin wearing graduated compression on a daily basis a prescription was given. The patient will  wear the stockings first thing in the morning and removing them in the evening. The patient is instructed specifically not to sleep in the stockings.   In addition, behavioral modification will be initiated.  This will include frequent elevation, use of over the counter pain medications and exercise as tolerated given the weakness of his legs.  Consideration for a lymph pump will also be made based upon the effectiveness of conservative therapy.  This would help to improve the edema control and prevent sequela such as ulcers and infections   Patient should undergo duplex ultrasound of the venous system to ensure that DVT or reflux is not present.  The patient will follow-up with me after the ultrasound.   3. Chronic systolic (congestive) heart failure (HCC) Continue cardiac and antihypertensive medications as already ordered and reviewed, no changes at this time.  Continue statin as ordered and reviewed, no changes at this time  Diuretics PRN for failure    Hortencia Pilar, MD  09/07/2022 11:05 AM

## 2022-09-08 ENCOUNTER — Encounter (INDEPENDENT_AMBULATORY_CARE_PROVIDER_SITE_OTHER): Payer: Self-pay | Admitting: Vascular Surgery

## 2022-09-08 ENCOUNTER — Telehealth: Payer: Self-pay | Admitting: Urology

## 2022-09-08 ENCOUNTER — Other Ambulatory Visit (INDEPENDENT_AMBULATORY_CARE_PROVIDER_SITE_OTHER): Payer: Self-pay | Admitting: Vascular Surgery

## 2022-09-08 ENCOUNTER — Ambulatory Visit (INDEPENDENT_AMBULATORY_CARE_PROVIDER_SITE_OTHER): Payer: Medicare HMO

## 2022-09-08 ENCOUNTER — Ambulatory Visit (INDEPENDENT_AMBULATORY_CARE_PROVIDER_SITE_OTHER): Payer: Medicare HMO | Admitting: Vascular Surgery

## 2022-09-08 VITALS — BP 105/69 | HR 59 | Ht 67.0 in | Wt 171.0 lb

## 2022-09-08 DIAGNOSIS — I5022 Chronic systolic (congestive) heart failure: Secondary | ICD-10-CM | POA: Diagnosis not present

## 2022-09-08 DIAGNOSIS — M7989 Other specified soft tissue disorders: Secondary | ICD-10-CM | POA: Diagnosis not present

## 2022-09-08 DIAGNOSIS — I890 Lymphedema, not elsewhere classified: Secondary | ICD-10-CM | POA: Diagnosis not present

## 2022-09-08 DIAGNOSIS — M79669 Pain in unspecified lower leg: Secondary | ICD-10-CM

## 2022-09-08 NOTE — Telephone Encounter (Signed)
Results from CT scan are in, [name] said can you please review.

## 2022-09-10 ENCOUNTER — Encounter (INDEPENDENT_AMBULATORY_CARE_PROVIDER_SITE_OTHER): Payer: Self-pay | Admitting: Vascular Surgery

## 2022-09-10 DIAGNOSIS — M7989 Other specified soft tissue disorders: Secondary | ICD-10-CM | POA: Insufficient documentation

## 2022-09-10 DIAGNOSIS — M79669 Pain in unspecified lower leg: Secondary | ICD-10-CM | POA: Insufficient documentation

## 2022-09-19 ENCOUNTER — Telehealth: Payer: Self-pay | Admitting: *Deleted

## 2022-09-19 NOTE — Telephone Encounter (Signed)
Please look at ct results .

## 2022-09-21 ENCOUNTER — Ambulatory Visit: Payer: Medicare HMO | Admitting: Urology

## 2022-09-21 ENCOUNTER — Encounter: Payer: Self-pay | Admitting: Urology

## 2022-09-21 VITALS — BP 130/74 | HR 78 | Ht 65.0 in | Wt 171.0 lb

## 2022-09-21 DIAGNOSIS — N210 Calculus in bladder: Secondary | ICD-10-CM | POA: Diagnosis not present

## 2022-09-21 DIAGNOSIS — N3941 Urge incontinence: Secondary | ICD-10-CM

## 2022-09-21 LAB — MICROSCOPIC EXAMINATION: WBC, UA: >30 /hpf — AB (ref 0–5)

## 2022-09-21 LAB — URINALYSIS, COMPLETE
Bilirubin, UA: NEGATIVE
Glucose, UA: NEGATIVE
Ketones, UA: NEGATIVE
Nitrite, UA: NEGATIVE
Specific Gravity, UA: >1.03 — ABNORMAL HIGH (ref 1.005–1.030)
Urobilinogen, Ur: 0.2 mg/dL (ref 0.2–1.0)
pH, UA: 5.5 (ref 5.0–7.5)

## 2022-09-22 ENCOUNTER — Other Ambulatory Visit: Payer: Self-pay | Admitting: Urology

## 2022-09-22 ENCOUNTER — Encounter: Payer: Self-pay | Admitting: Urology

## 2022-09-22 ENCOUNTER — Telehealth: Payer: Self-pay

## 2022-09-22 DIAGNOSIS — N210 Calculus in bladder: Secondary | ICD-10-CM

## 2022-09-22 NOTE — Progress Notes (Addendum)
   [facility] From  Surgery Date: Date: 10/11/2022  Surgeon: Dr. John Giovanni, MD  Inpt ( No  )   Outpt (Yes)   Obs ( No  )   Diagnosis: N21.0 Bladder Stone  -CPT: [phone]  Surgery: Cystolitholapaxy  Stop Anticoagulations: N/A  Cardiac/Medical/Pulmonary Clearance needed: no  *Orders entered into EPIC  Date: 09/22/22   *Case booked in [place]  Date: 09/22/22  *Notified pt of Surgery: Date: 09/22/22  PRE-OP UA & CX: yes, obtained in clinic on 09/21/2022  *Placed into Prior Authorization Work Fabio Bering Date: 09/22/22  Assistant/laser/rep:No

## 2022-09-22 NOTE — Progress Notes (Signed)
Surgical Physician Order Form [facility] [place]  * Scheduling expectation : Next Available  *Length of Case: 90 minutes  *Clearance needed: History of anesthesia complications at [facility] (hallucinations, mental status changes which lasted ~ 4 days).  Does he need to see Gaspar Bidding?  *Anticoagulation Instructions: N/A  *Aspirin Instructions: N/A  *Post-op visit Date/Instructions:   1 day cath removal; 1 month postop follow-up  *Diagnosis: Bladder Stone (>2.5 cm)  *Procedure:  Cystolitholapaxy >2.5cm (25053)   Additional orders: N/A  -Admit type: OUTpatient  -Anesthesia: Choice  -VTE Prophylaxis Standing Order SCD's       Other:   -Standing Lab Orders Per Anesthesia    Lab other: UA&Urine Culture-ordered 09/21/2022  -Standing Test orders EKG/Chest x-ray per Anesthesia       Test other:   - Medications:  Ceftriaxone(Rocephin) 1gm IV  -Other orders:  N/A

## 2022-09-22 NOTE — H&P (View-Only) (Signed)
09/21/2022 6:58 AM   Wesley Anderson [birth year] [id number]  Referring provider: Adin Hector, MD [place] [facility] [place],  [place] [address]  Chief Complaint  Patient presents with   Other    HPI: 77 y.o. male referred by Dr. Matilde Sprang for management of a bladder calculus.  He presents today with his wife.  Followed by Dr. 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  This was also present on a CT December 2022 and measured 2.5 cm at that time Cystoscopy by Dr. Matilde Sprang 08/29/2022 remarkable for lateral lobe enlargement and mild bladder neck elevation.  Calculus was identified in the bladder however visualization suboptimal secondary to urine sediment   PMH: Past Medical History:  Diagnosis Date   AAA (abdominal aortic aneurysm) (HCC)    Alcohol dependence in remission ([place])    Cancer ([place])    skin    Colon polyps    ED (erectile dysfunction)    GERD (gastroesophageal reflux disease)    Hematest positive stools    Melanoma of skin (HCC)    Parkinson's disease    Renal cyst, right    Restless leg syndrome     Surgical History: Past Surgical History:  Procedure Laterality Date   CHOLECYSTECTOMY     COLON SURGERY     colectomy with anastamosis   COLONOSCOPY WITH PROPOFOL N/A 11/07/2016   Procedure: COLONOSCOPY WITH PROPOFOL;  Surgeon: Lollie Sails, MD;  Location: [facility] ENDOSCOPY;  Service: Endoscopy;  Laterality: N/A;   COLONOSCOPY WITH PROPOFOL N/A 09/06/2018   Procedure: COLONOSCOPY WITH PROPOFOL;  Surgeon: Lollie Sails, MD;  Location: [facility] ENDOSCOPY;  Service: Endoscopy;  Laterality: N/A;   COLONOSCOPY WITH PROPOFOL N/A 05/16/2019   Procedure: COLONOSCOPY WITH PROPOFOL;  Surgeon: Lollie Sails, MD;  Location: [facility] ENDOSCOPY;  Service: Endoscopy;  Laterality: N/A;   CYST EXCISION     vocal cord   DBS placement      deep brain implant     ESOPHAGOGASTRODUODENOSCOPY (EGD) WITH PROPOFOL N/A 09/06/2018   Procedure: ESOPHAGOGASTRODUODENOSCOPY (EGD) WITH PROPOFOL;  Surgeon: Lollie Sails, MD;  Location: [facility] ENDOSCOPY;  Service: Endoscopy;  Laterality: N/A;   MELANOMA EXCISION     TONSILLECTOMY      Home Medications:  Allergies as of 09/21/2022       Reactions   Artane [trihexyphenidyl] Anxiety   Prochlorperazine Nausea And Vomiting   Propofol Other (See Comments)   Delirium Delirium Delirium Delirium        Medication List        Accurate as of September 21, 2022 11:59 PM. If you have any questions, ask your nurse or doctor.          carbidopa-levodopa 25-100 MG tablet Commonly known as: SINEMET IR Take 2 tablets by mouth 4 (four) times daily.   donepezil 10 MG tablet Commonly known as: ARICEPT Take 10 mg by mouth daily.   Elderberry Zinc/Vit C/Immune Lozg Use as directed 1 lozenge in the mouth or throat daily.   Gemtesa 75 MG Tabs Generic drug: Vibegron Take 1 tablet by mouth daily.   melatonin 3 MG Tabs tablet Take 9 mg by mouth at bedtime.   mirabegron ER 50 MG Tb24 tablet Commonly known as: MYRBETRIQ Take 1 tablet (50 mg total) by mouth daily.   multivitamin tablet Take 1 tablet by mouth daily.   nitrofurantoin 100 MG capsule Commonly  known as: Macrodantin Take 1 capsule (100 mg total) by mouth 4 (four) times daily.   pantoprazole 40 MG tablet Commonly known as: PROTONIX Take 40 mg by mouth daily.   polyethylene glycol 17 g packet Commonly known as: MIRALAX / GLYCOLAX Take 17 g by mouth daily as needed for mild constipation or moderate constipation.   QUEtiapine 25 MG tablet Commonly known as: SEROQUEL Take 75 mg by mouth at bedtime.   sertraline 100 MG tablet Commonly known as: ZOLOFT Take 100 mg by mouth daily.   tamsulosin 0.4 MG Caps capsule Commonly known as: FLOMAX Take 0.4 mg by mouth daily.   trimethoprim 100 MG tablet Commonly known as:  TRIMPEX Take 1 tablet (100 mg total) by mouth daily.        Allergies:  Allergies  Allergen Reactions   Artane [Trihexyphenidyl] Anxiety   Prochlorperazine Nausea And Vomiting   Propofol Other (See Comments)    Delirium Delirium Delirium Delirium     Family History: Family History  Problem Relation Age of Onset   Diabetes Father     Social History:  reports that he has quit smoking. He has never used smokeless tobacco. He reports current alcohol use of about 6.0 standard drinks of alcohol per week. He reports that he does not currently use drugs.   Physical Exam: BP 130/74   Pulse 78   Ht '5\' 5"'$  (1.651 m)   Wt 171 lb (77.6 kg)   BMI 28.46 kg/m   Constitutional:  Alert, No acute distress. HEENT: [place] AT Respiratory: Normal respiratory effort, no increased work of breathing. Psychiatric: Normal mood and affect.  Laboratory Data:  Urinalysis Pending  Pertinent Imaging: CT images were personally reviewed and interpreted.  Bladder calculus measured 2.8 x 2.8 x 3 cm.  Stone density: 1200 HU  CT RENAL STONE STUDY  Narrative CLINICAL DATA:  Abdominal/flank pain. Evaluate for urinary tract calculi.  EXAM: CT ABDOMEN AND PELVIS WITHOUT CONTRAST  TECHNIQUE: Multidetector CT imaging of the abdomen and pelvis was performed following the standard protocol without IV contrast.  RADIATION DOSE REDUCTION: This exam was performed according to the departmental dose-optimization program which includes automated exposure control, adjustment of the mA and/or kV according to patient size and/or use of iterative reconstruction technique.  COMPARISON:  08/08/2021  FINDINGS: Lower chest: No pleural fluid or airspace disease.  Hepatobiliary: Anterior dome of liver cyst measures 1.3 cm, image 15/2. No suspicious liver abnormality. Prior cholecystectomy. No bile duct dilatation.  Pancreas: Unremarkable. No pancreatic ductal dilatation or surrounding inflammatory  changes.  Spleen: Normal in size without focal abnormality.  Adrenals/Urinary Tract: Adrenal glands are unremarkable.  Small exophytic cyst arising off the lateral cortex of the left kidney measures 8 mm.  Lateral cortex of right kidney cyst measures 1.4 cm, image 40/2. No follow-up imaging recommended.  Inferior pole right kidney stone measures 5 mm, unchanged from previous exam. No signs of hydronephrosis or hydroureter. No ureteral calculi.  Large bladder stone measures 2.9 cm, image 73/2. Unchanged from previous exam. Small diverticula arising off the right posterior bladder wall measures 1 cm and contains a tiny 3 mm calcification, image 70/2. Left posterior bladder wall diverticula measures 1 cm, image 70/2. Mild circumferential bladder wall thickening is unchanged.  Stomach/Bowel: Stomach appears within normal limits. Signs of previous right hemicolectomy. Moderate stool burden identified within the rectum. No bowel wall thickening, inflammation, or distension.  Vascular/Lymphatic: Aortic atherosclerosis. No aneurysm. No signs of abdominopelvic adenopathy.  Reproductive: Prostate is unremarkable.  Other: No free fluid or fluid collections. 2.9 cm fat containing umbilical hernia. Small fat containing left inguinal hernia is also noted.  Musculoskeletal: No acute or significant osseous findings. Lumbar spondylosis.  IMPRESSION: 1. No acute findings within the abdomen or pelvis. 2. Nonobstructing right renal calculus.  Unchanged. 3. Large bladder stone is unchanged from previous exam. 4. Small bladder wall diverticula. 5. Moderate stool burden identified within the rectum. Correlate for any clinical signs or symptoms of constipation. 6. Fat containing umbilical and left inguinal hernias. 7.  Aortic Atherosclerosis (ICD10-I70.0).   Electronically Signed By: Kerby Moors M.D. On: 09/08/2022 11:43   Assessment & Plan:    1.  Bladder calculus We discussed  the calculus may be contributing to his recurrent UTI and dysuria Cystolitholapaxy was recommended.  The procedure was discussed in detail including potential risks of bleeding, infection and bladder injury.  The potential need for a catheter postoperatively was discussed.  All questions were answered and they desire to schedule   Abbie Sons, MD  [place] [address] [place], [place] [address] [phone]

## 2022-09-22 NOTE — Progress Notes (Signed)
 09/21/2022 6:58 AM   Wesley L Helbing Jr. 03/16/1946 [id number]  Referring provider: Klein, Bert J III, MD [address] [facility] [place],  [place] [address]  Chief Complaint  Patient presents with   Other    HPI: 77 y.o. male referred by Dr. MacDiarmid for management of a bladder calculus.  He presents today with his wife.  Followed by Dr. MacDiarmid for urge incontinence and neurogenic bladder secondary to Parkinson's CT 09/03/2022 remarkable for a bladder calculus measuring ~ 3 cm.  This was also present on a CT December 2022 and measured 2.5 cm at that time Cystoscopy by Dr. MacDiarmid 08/29/2022 remarkable for lateral lobe enlargement and mild bladder neck elevation.  Calculus was identified in the bladder however visualization suboptimal secondary to urine sediment   PMH: Past Medical History:  Diagnosis Date   AAA (abdominal aortic aneurysm) (HCC)    Alcohol dependence in remission (HCC)    Cancer (HCC)    skin    Colon polyps    ED (erectile dysfunction)    GERD (gastroesophageal reflux disease)    Hematest positive stools    Melanoma of skin (HCC)    Parkinson's disease    Renal cyst, right    Restless leg syndrome     Surgical History: Past Surgical History:  Procedure Laterality Date   CHOLECYSTECTOMY     COLON SURGERY     colectomy with anastamosis   COLONOSCOPY WITH PROPOFOL N/A 11/07/2016   Procedure: COLONOSCOPY WITH PROPOFOL;  Surgeon: Martin U Skulskie, MD;  Location: [facility] ENDOSCOPY;  Service: Endoscopy;  Laterality: N/A;   COLONOSCOPY WITH PROPOFOL N/A 09/06/2018   Procedure: COLONOSCOPY WITH PROPOFOL;  Surgeon: Skulskie, Martin U, MD;  Location: [facility] ENDOSCOPY;  Service: Endoscopy;  Laterality: N/A;   COLONOSCOPY WITH PROPOFOL N/A 05/16/2019   Procedure: COLONOSCOPY WITH PROPOFOL;  Surgeon: Skulskie, Martin U, MD;  Location: [facility] ENDOSCOPY;  Service: Endoscopy;  Laterality: N/A;   CYST EXCISION     vocal cord   DBS placement      deep brain implant     ESOPHAGOGASTRODUODENOSCOPY (EGD) WITH PROPOFOL N/A 09/06/2018   Procedure: ESOPHAGOGASTRODUODENOSCOPY (EGD) WITH PROPOFOL;  Surgeon: Skulskie, Martin U, MD;  Location: [facility] ENDOSCOPY;  Service: Endoscopy;  Laterality: N/A;   MELANOMA EXCISION     TONSILLECTOMY      Home Medications:  Allergies as of 09/21/2022       Reactions   Artane [trihexyphenidyl] Anxiety   Prochlorperazine Nausea And Vomiting   Propofol Other (See Comments)   Delirium Delirium Delirium Delirium        Medication List        Accurate as of September 21, 2022 11:59 PM. If you have any questions, ask your nurse or doctor.          carbidopa-levodopa 25-100 MG tablet Commonly known as: SINEMET IR Take 2 tablets by mouth 4 (four) times daily.   donepezil 10 MG tablet Commonly known as: ARICEPT Take 10 mg by mouth daily.   Elderberry Zinc/Vit C/Immune Lozg Use as directed 1 lozenge in the mouth or throat daily.   Gemtesa 75 MG Tabs Generic drug: Vibegron Take 1 tablet by mouth daily.   melatonin 3 MG Tabs tablet Take 9 mg by mouth at bedtime.   mirabegron ER 50 MG Tb24 tablet Commonly known as: MYRBETRIQ Take 1 tablet (50 mg total) by mouth daily.   multivitamin tablet Take 1 tablet by mouth daily.   nitrofurantoin 100 MG capsule Commonly   known as: Macrodantin Take 1 capsule (100 mg total) by mouth 4 (four) times daily.   pantoprazole 40 MG tablet Commonly known as: PROTONIX Take 40 mg by mouth daily.   polyethylene glycol 17 g packet Commonly known as: MIRALAX / GLYCOLAX Take 17 g by mouth daily as needed for mild constipation or moderate constipation.   QUEtiapine 25 MG tablet Commonly known as: SEROQUEL Take 75 mg by mouth at bedtime.   sertraline 100 MG tablet Commonly known as: ZOLOFT Take 100 mg by mouth daily.   tamsulosin 0.4 MG Caps capsule Commonly known as: FLOMAX Take 0.4 mg by mouth daily.   trimethoprim 100 MG tablet Commonly known as:  TRIMPEX Take 1 tablet (100 mg total) by mouth daily.        Allergies:  Allergies  Allergen Reactions   Artane [Trihexyphenidyl] Anxiety   Prochlorperazine Nausea And Vomiting   Propofol Other (See Comments)    Delirium Delirium Delirium Delirium     Family History: Family History  Problem Relation Age of Onset   Diabetes Father     Social History:  reports that he has quit smoking. He has never used smokeless tobacco. He reports current alcohol use of about 6.0 standard drinks of alcohol per week. He reports that he does not currently use drugs.   Physical Exam: BP 130/74   Pulse 78   Ht 5' 5" (1.651 m)   Wt 171 lb (77.6 kg)   BMI 28.46 kg/m   Constitutional:  Alert, No acute distress. HEENT: [place] AT Respiratory: Normal respiratory effort, no increased work of breathing. Psychiatric: Normal mood and affect.  Laboratory Data:  Urinalysis Pending  Pertinent Imaging: CT images were personally reviewed and interpreted.  Bladder calculus measured 2.8 x 2.8 x 3 cm.  Stone density: 1200 HU  CT RENAL STONE STUDY  Narrative CLINICAL DATA:  Abdominal/flank pain. Evaluate for urinary tract calculi.  EXAM: CT ABDOMEN AND PELVIS WITHOUT CONTRAST  TECHNIQUE: Multidetector CT imaging of the abdomen and pelvis was performed following the standard protocol without IV contrast.  RADIATION DOSE REDUCTION: This exam was performed according to the departmental dose-optimization program which includes automated exposure control, adjustment of the mA and/or kV according to patient size and/or use of iterative reconstruction technique.  COMPARISON:  08/08/2021  FINDINGS: Lower chest: No pleural fluid or airspace disease.  Hepatobiliary: Anterior dome of liver cyst measures 1.3 cm, image 15/2. No suspicious liver abnormality. Prior cholecystectomy. No bile duct dilatation.  Pancreas: Unremarkable. No pancreatic ductal dilatation or surrounding inflammatory  changes.  Spleen: Normal in size without focal abnormality.  Adrenals/Urinary Tract: Adrenal glands are unremarkable.  Small exophytic cyst arising off the lateral cortex of the left kidney measures 8 mm.  Lateral cortex of right kidney cyst measures 1.4 cm, image 40/2. No follow-up imaging recommended.  Inferior pole right kidney stone measures 5 mm, unchanged from previous exam. No signs of hydronephrosis or hydroureter. No ureteral calculi.  Large bladder stone measures 2.9 cm, image 73/2. Unchanged from previous exam. Small diverticula arising off the right posterior bladder wall measures 1 cm and contains a tiny 3 mm calcification, image 70/2. Left posterior bladder wall diverticula measures 1 cm, image 70/2. Mild circumferential bladder wall thickening is unchanged.  Stomach/Bowel: Stomach appears within normal limits. Signs of previous right hemicolectomy. Moderate stool burden identified within the rectum. No bowel wall thickening, inflammation, or distension.  Vascular/Lymphatic: Aortic atherosclerosis. No aneurysm. No signs of abdominopelvic adenopathy.  Reproductive: Prostate is unremarkable.    Other: No free fluid or fluid collections. 2.9 cm fat containing umbilical hernia. Small fat containing left inguinal hernia is also noted.  Musculoskeletal: No acute or significant osseous findings. Lumbar spondylosis.  IMPRESSION: 1. No acute findings within the abdomen or pelvis. 2. Nonobstructing right renal calculus.  Unchanged. 3. Large bladder stone is unchanged from previous exam. 4. Small bladder wall diverticula. 5. Moderate stool burden identified within the rectum. Correlate for any clinical signs or symptoms of constipation. 6. Fat containing umbilical and left inguinal hernias. 7.  Aortic Atherosclerosis (ICD10-I70.0).   Electronically Signed By: Taylor  Stroud M.D. On: 09/08/2022 11:43   Assessment & Plan:    1.  Bladder calculus We discussed  the calculus may be contributing to his recurrent UTI and dysuria Cystolitholapaxy was recommended.  The procedure was discussed in detail including potential risks of bleeding, infection and bladder injury.  The potential need for a catheter postoperatively was discussed.  All questions were answered and they desire to schedule   Miquan Tandon C Holley Kocurek, MD  [facility] [address], [address] [place], [place] [address] [phone]  

## 2022-09-22 NOTE — Telephone Encounter (Signed)
I spoke with Wesley Anderson and his wife. We have discussed possible surgery dates and Tuesday February 27th, 2024 was agreed upon by all parties. Patient given information about surgery date, what to expect pre-operatively and post operatively.  We discussed that a Pre-Admission Testing office will be calling to set up the pre-op visit that will take place prior to surgery, and that these appointments are typically done over the phone with a Pre-Admissions RN. Informed patient that our office will communicate any additional care to be provided after surgery. Patients questions or concerns were discussed during our call. Advised to call our office should there be any additional information, questions or concerns that arise. Patient verbalized understanding.

## 2022-09-23 ENCOUNTER — Encounter: Payer: Self-pay | Admitting: Urology

## 2022-09-25 LAB — CULTURE, URINE COMPREHENSIVE

## 2022-09-27 ENCOUNTER — Telehealth: Payer: Self-pay

## 2022-09-27 MED ORDER — DOXYCYCLINE HYCLATE 100 MG PO CAPS: 100.0000 mg | ORAL_CAPSULE | Freq: Two times a day (BID) | ORAL | 0 refills | Status: DC

## 2022-09-27 NOTE — Addendum Note (Signed)
Addended by: Gerald Leitz A on: 09/27/2022 10:38 AM   Modules accepted: Orders

## 2022-09-27 NOTE — Telephone Encounter (Signed)
-----   Message from Abbie Sons, MD sent at 09/26/2022  5:12 PM EST ----- Urine culture was positive.  Please send Rx doxycycline 100 mg twice daily x 14 days.  Change preop IV antibiotics to vancomycin per pharmacy consult

## 2022-09-27 NOTE — Telephone Encounter (Signed)
Spoke with pt. Wife, aware of results and the need to start medication. Verbalized understanding. I have also change Pre-op ABXs to Vancomycin.

## 2022-10-03 ENCOUNTER — Encounter
Admission: RE | Admit: 2022-10-03 | Discharge: 2022-10-03 | Disposition: A | Discharge status: Home / Self Care | Payer: Medicare HMO | Source: Ambulatory Visit | Attending: Urology | Admitting: Urology

## 2022-10-03 HISTORY — DX: Bradycardia, unspecified: R00.1

## 2022-10-03 HISTORY — DX: Urinary tract infection, site not specified: N39.0

## 2022-10-03 HISTORY — DX: Constipation, unspecified: K59.00

## 2022-10-03 HISTORY — DX: Unspecified place in unspecified non-institutional (private) residence as the place of occurrence of the external cause: W19.XXXA

## 2022-10-03 HISTORY — DX: Personal history of urinary calculi: Z87.442

## 2022-10-03 HISTORY — DX: Unspecified place in unspecified non-institutional (private) residence as the place of occurrence of the external cause: Y92.009

## 2022-10-03 HISTORY — DX: Parkinson's disease without dyskinesia, without mention of fluctuations: G20.A1

## 2022-10-03 HISTORY — DX: Unspecified urinary incontinence: R32

## 2022-10-03 HISTORY — DX: Sepsis, unspecified organism: A41.9

## 2022-10-03 NOTE — Patient Instructions (Signed)
Your procedure is scheduled on:10-11-22 Tuesday Report to the Registration Desk on the 1st floor of the [facility].Then proceed to the 2nd floor Surgery Desk To find out your arrival time, please call [phone] between 1PM - 3PM on:10-10-22 Monday If your arrival time is 6:00 am, do not arrive before that time as the [facility] entrance doors do not open until 6:00 am.  REMEMBER: Instructions that are not followed completely may result in serious medical risk, up to and including death; or upon the discretion of your surgeon and anesthesiologist your surgery may need to be rescheduled.  Do not eat food OR drink any liquids after midnight the night before surgery.  No gum chewing or hard candies.  One week prior to surgery: Stop Anti-inflammatories (NSAIDS) such as Advil, Aleve, Ibuprofen, Motrin, Naproxen, Naprosyn and Aspirin based products such as Excedrin, Goody's Powder, BC Powder.You may however, take Tylenol if needed for pain up until the day of surgery.  Stop ANY OVER THE COUNTER supplements/vitamins NOW (10-13-22) until after surgery (Multivitamin, Elderberry with Zinc-Vitamin C and Probiotic)-You may continue your Melatonin up until the night prior to your surgery  TAKE ONLY THESE MEDICATIONS THE MORNING OF SURGERY WITH A SIP OF WATER: -carbidopa-levodopa (SINEMET IR)  -pantoprazole (PROTONIX)  -sertraline (ZOLOFT)   No Alcohol for 24 hours before or after surgery.  No Smoking including e-cigarettes for 24 hours before surgery.  No chewable tobacco products for at least 6 hours before surgery.  No nicotine patches on the day of surgery.  Do not use any "recreational" drugs for at least a week (preferably 2 weeks) before your surgery.  Please be advised that the combination of cocaine and anesthesia may have negative outcomes, up to and including death. If you test positive for cocaine, your surgery will be cancelled.  On the morning of surgery brush your teeth with  toothpaste and water, you may rinse your mouth with mouthwash if you wish. Do not swallow any toothpaste or mouthwash.  Do not wear jewelry, make-up, hairpins, clips or nail polish.  Do not wear lotions, powders, or perfumes.   Do not shave body hair from the neck down 48 hours before surgery.  Contact lenses, hearing aids and dentures may not be worn into surgery.  Do not bring valuables to the hospital. [facility] is not responsible for any missing/lost belongings or valuables.   Notify your doctor if there is any change in your medical condition (cold, fever, infection).  Wear comfortable clothing (specific to your surgery type) to the hospital.  After surgery, you can help prevent lung complications by doing breathing exercises.  Take deep breaths and cough every 1-2 hours. Your doctor may order a device called an Incentive Spirometer to help you take deep breaths. When coughing or sneezing, hold a pillow firmly against your incision with both hands. This is called "splinting." Doing this helps protect your incision. It also decreases belly discomfort.  If you are being admitted to the hospital overnight, leave your suitcase in the car. After surgery it may be brought to your room.  In case of increased patient census, it may be necessary for you, the patient, to continue your postoperative care in the Same Day Surgery department.  If you are being discharged the day of surgery, you will not be allowed to drive home. You will need a responsible individual to drive you home and stay with you for 24 hours after surgery.   If you are taking public transportation,  you will need to have a responsible individual with you.  Please call the [facility] Dept. at [phone] if you have any questions about these instructions.  Surgery Visitation Policy:  Patients undergoing a surgery or procedure may have two family members or support persons with them as long as the person  is not COVID-19 positive or experiencing its symptoms.   Due to an increase in RSV and influenza rates and associated hospitalizations, children ages 31 and under will not be able to visit patients in [facility]. Masks continue to be strongly recommended.

## 2022-10-06 ENCOUNTER — Encounter: Payer: Self-pay | Admitting: Urology

## 2022-10-06 NOTE — Progress Notes (Signed)
Perioperative / Anesthesia Services  Pre-Admission Testing Clinical Review / Preoperative Anesthesia Consult  Date: 10/07/22  Patient Demographics:  Name: Wesley Anderson. DOB:   03-12-1946 MRN:   [id number]  Planned Surgical Procedure(s):    Case: [id number] Date/Time: 10/11/22 0806   Procedure: CYSTOSCOPY WITH LITHOLAPAXY   Anesthesia type: Choice   Pre-op diagnosis: Bladder Stone   Location: [facility] OR ROOM 10 / [place] ORS FOR ANESTHESIA GROUP   Surgeons: Abbie Sons, MD     NOTE: Available PAT nursing documentation and vital signs have been reviewed. Clinical nursing staff has updated patient's PMH/PSHx, current medication list, and drug allergies/intolerances to ensure comprehensive history available to assist in medical decision making as it pertains to the aforementioned surgical procedure and anticipated anesthetic course. Extensive review of available clinical information personally performed. [place] PMH and PSHx updated with any diagnoses/procedures that  may have been inadvertently omitted during his intake with the pre-admission testing department's nursing staff.  Clinical Discussion:  Wesley Anderson. is a 77 y.o. male who is submitted for pre-surgical anesthesia review and clearance prior to him undergoing the above procedure. Patient is a Former Research scientist (life sciences). Pertinent PMH includes: HFrEF, CAD, thoracoabdominal aortic ectasia, aortic atherosclerosis, sinus bradycardia, GERD (on daily PPI), anemia, Parkinson's disease (s/p DBS placement), dementia, falls at home, nephrolithiasis, inguinal and umbilical hernias, BPH, depression, RLS, sleep difficulties.  Patient is followed by cardiology Saralyn Pilar, MD). He was last seen in the cardiology clinic on 07/21/2022; notes reviewed. At the time of his clinic visit, patient doing well overall from a cardiovascular perspective.  Patient reported a several month history of peripheral edema (R >L).  Patient was prescribed diuretic  therapy (furosemide), however patient's wife decided not to fill prescription due to potential interactions between this medication and medications used to treat his Parkinson's disease. Patient denied any chest pain, shortness of breath, PND, orthopnea, palpitations, weakness, fatigue, vertiginous symptoms, or presyncope/syncope. Patient with a past medical history significant for cardiovascular diagnoses. Documented physical exam was grossly benign, providing no evidence of acute exacerbation and/or decompensation of the patient's known cardiovascular conditions.  Myocardial perfusion imaging study performed on 04/12/2021 revealed a normal left ventricular systolic function with an EF of >55%.  There was no evidence of stress-induced myocardial ischemia or arrhythmia; no scintigraphic evidence of scar.  Study determined to be normal low risk overall.  Most recent TTE was performed on 04/13/2022 revealing a mildly reduced left ventricular systolic function with an EF of 40%.  There was global hypokinesis.  Left atrium mildly enlarged.  There was mild paravalvular regurgitation observed.  All transvalvular gradients were noted to be normal providing no evidence suggestive of valvular stenosis.  Blood pressure well controlled at 120/68 mmHg without the use of pharmacological interventions.  Patient is not currently taking any type of lipid-lowering therapies for ASCVD prevention. Patient is not diabetic. He does not have an OSAH diagnosis.  As previously mentioned, patient with a Parkinson's diagnosis.  He is status post placement of an STN-DBS device; he is followed by neurology. Patient with recurrent falls at home.  He requires the use of gait aids for assistance with safe ambulation.  Functional capacity limited by patient's age and multiple medical comorbidities.  With that being said, patient questionably able to achieve 4 METS of physical activity without experiencing angina/anginal equivalent symptoms.  No changes were made to his medication regimen during his visit with cardiology.  Patient scheduled to follow-up with outpatient cardiology in 4 months or  sooner if needed.  Wesley Anderson. is scheduled for an elective CYSTOSCOPY WITH LITHOLAPAXY on 10/11/2022 with Dr. John Giovanni, MD. given patient's past medical history significant for cardiovascular and neurological diagnoses, presurgical clearances were sought from patient's cardiology and neurology providers.  Specialty clearances were obtained as follows.  Per neurology, "this patient is optimized for surgery and may proceed with planned procedural course with a LOW risk of significant perioperative complications".  Per cardiology, "this patient is optimized for surgery and may proceed with the planned procedural course with a LOW risk of significant perioperative cardiovascular complications".  In review of his medication reconciliation, the patient is not noted to be taking any type of anticoagulation or antiplatelet therapies that would need to be held during his perioperative course.  Patient reports previous perioperative complications with anesthesia in the past. Patient with issues with significant (+) postoperative delirium in the setting of known Parkinson's Disease with associated dementia. In review of the available records, it is noted that patient underwent a general anesthetic course at [facility] ([facility]) in 06/2020 without documented complications.      10/03/2022    5:00 PM 09/21/2022    2:47 PM 09/08/2022    2:40 PM  Vitals with BMI  Height '5\' 5"'$  '5\' 5"'$  '5\' 7"'$   Weight 167 lbs 9 oz 171 lbs 171 lbs  BMI 27.88 0000000 123456  Systolic  [id number]  Diastolic  74 69  Pulse  78 59    Providers/Specialists:   NOTE: Primary physician provider listed below. Patient may have been seen by APP or partner within same practice.   PROVIDER ROLE / SPECIALTY LAST Claud Kelp, MD  Urology (Surgeon) 09/21/2022  Adin Hector, MD Primary Care Provider 09/02/2022  Isaias Cowman, MD Cardiology 07/21/2022  Eldridge Abrahams, MD Neurology 06/08/2022   Allergies:  Artane [trihexyphenidyl], Prochlorperazine, Propofol, and Reglan [metoclopramide]  Current Home Medications:   No current facility-administered medications for this encounter.    carbidopa-levodopa (SINEMET IR) 25-100 MG tablet   donepezil (ARICEPT) 10 MG tablet   doxycycline (VIBRAMYCIN) 100 MG capsule   GEMTESA 75 MG TABS   Lactobacillus-Inulin (PROBIOTIC DIGESTIVE SUPPORT PO)   melatonin 3 MG TABS tablet   Misc Natural Products (ELDERBERRY ZINC/VIT C/IMMUNE) LOZG   Multiple Vitamin (MULTIVITAMIN) tablet   pantoprazole (PROTONIX) 40 MG tablet   polyethylene glycol (MIRALAX / GLYCOLAX) 17 g packet   QUEtiapine (SEROQUEL) 25 MG tablet   sertraline (ZOLOFT) 100 MG tablet   tamsulosin (FLOMAX) 0.4 MG CAPS capsule   History:   Past Medical History:  Diagnosis Date   Alcohol dependence in remission ([place])    Anemia    Aortic atherosclerosis (HCC)    Aortic ectasia, thoracoabdominal (HCC)    a.) [place] retroperitoneal 05/31/2016: abd Ao measured 2.7 cm; b.) CT chest 11/16/2020: asc Ao measured 4.0 cm; c.)  CT chest 01/22/2021: asc Ao measured 4.0 cm; d.) CT chest 03/26/2021: asc Ao measured 4.0 cm   Bladder stones    BPH (benign prostatic hyperplasia)    Complication of anesthesia    a.) (+) postoperative delirium   Constipation    Coronary artery calcification seen on CT scan    Depression    ED (erectile dysfunction)    Fall at home    GERD (gastroesophageal reflux disease)    Hematest positive stools    HFrEF (heart failure with reduced ejection fraction) ([place])    a.) TTE 04/13/2022:  EF 40%, glob HK, mild panvalvular regurg.   History of 2019 novel coronavirus disease (COVID-19) 04/22/2022   History of kidney stones    History of postoperative delirium 07/03/2020   a.) in the setting  of GA for skin cancer removal; combativeness + acute worsening AMS + worsening memory/overall cognition persistent for several days following procedure   Incontinence of urine    Left inguinal hernia    Melanoma of skin ([place])    Nephrolithiasis    Parkinson's disease    a.) RIGHT STN-DBS in place   Parkinson's disease dementia ([place])    a.) on donepezil   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)    Renal cyst, right    Restless leg syndrome    Sepsis ([place])    Sinus bradycardia    Sleep difficulties    a.) takes melatonin   Squamous cell carcinoma of scalp    a.) s/p flap excision [facility] 07/03/2020 under general anesthesia; procedure complicated by (+) postoperative delirium   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   a.) s/p RIGHT STN-DBS placement   Tubular adenoma of colon    Umbilical hernia    UTI (urinary tract infection)    Vocal cord polyp    a.) s/p removal in 1973   Past Surgical History:  Procedure Laterality Date   CHOLECYSTECTOMY     COLONOSCOPY WITH PROPOFOL N/A 11/07/2016   Procedure: COLONOSCOPY WITH PROPOFOL;  Surgeon: Lollie Sails, MD;  Location: [facility] ENDOSCOPY;  Service: Endoscopy;  Laterality: N/A;   COLONOSCOPY WITH PROPOFOL N/A 09/06/2018   Procedure: COLONOSCOPY WITH PROPOFOL;  Surgeon: Lollie Sails, MD;  Location: [facility] ENDOSCOPY;  Service: Endoscopy;  Laterality: N/A;   COLONOSCOPY WITH PROPOFOL N/A 05/16/2019   Procedure: COLONOSCOPY WITH PROPOFOL;  Surgeon: Lollie Sails, MD;  Location: [facility] ENDOSCOPY;  Service: Endoscopy;  Laterality: N/A;   CYST EXCISION     vocal cord   DEEP BRAIN STIMULATOR PLACEMENT Right 10/24/2013   ESOPHAGOGASTRODUODENOSCOPY (EGD) WITH PROPOFOL N/A 09/06/2018   Procedure: ESOPHAGOGASTRODUODENOSCOPY (EGD) WITH PROPOFOL;  Surgeon: Lollie Sails, MD;  Location: [facility] ENDOSCOPY;  Service: Endoscopy;  Laterality: N/A;   LAPAROSCOPIC PARTIAL COLECTOMY     MELANOMA EXCISION      TONSILLECTOMY     age 42   Family History  Problem Relation Age of Onset   Diabetes Father    Social History   Tobacco Use   Smoking status: Former    Types: Cigarettes   Smokeless tobacco: Never   Tobacco comments:    Socially only  Vaping Use   Vaping Use: Never used  Substance Use Topics   Alcohol use: Not Currently    Alcohol/week: 6.0 standard drinks of alcohol    Types: 3 Shots of liquor, 3 Standard drinks or equivalent per week   Drug use: Never    Pertinent Clinical Results:  LABS:   No visits with results within 3 Day(s) from this visit.  Latest known visit with results is:  Office Visit on 09/21/2022  Component Date Value Ref Range Status   Specific Gravity, UA 09/21/2022 >1.030 (H)  1.005 - 1.030 Final   pH, UA 09/21/2022 5.5  5.0 - 7.5 Final   Color, UA 09/21/2022 Yellow  Yellow Final   Appearance Ur 09/21/2022 Cloudy (A)  Clear Final   Leukocytes,UA 09/21/2022 1+ (A)  Negative Final   Protein,UA 09/21/2022 2+ (A)  Negative/Trace Final   Glucose, UA 09/21/2022 Negative  Negative Final  Ketones, UA 09/21/2022 Negative  Negative Final   RBC, UA 09/21/2022 Trace (A)  Negative Final   Bilirubin, UA 09/21/2022 Negative  Negative Final   Urobilinogen, Ur 09/21/2022 0.2  0.2 - 1.0 mg/dL Final   Nitrite, UA 09/21/2022 Negative  Negative Final   Microscopic Examination 09/21/2022 See below:   Final   Urine Culture, Comprehensive 09/21/2022 Final report (A)   Final   Organism ID, Bacteria 09/21/2022 Enterococcus faecalis (A)   Final   Comment: For Enterococcus species, aminoglycosides (except for high-level resistance screening), cephalosporins, clindamycin, and trimethoprim-sulfamethoxazole are not effective clinically. (CLSI, M100-S26, 2016) 50,000-100,000 colony forming units per mL    Organism ID, Bacteria 09/21/2022 Comment (A)   Final   Comment: Staphylococcus epidermidis Based on resistance to oxacillin this isolate would be resistant to all currently  available beta-lactam antimicrobial agents, with the exception of the newer cephalosporins with anti-MRSA activity, such as Ceftaroline 5,000  Colonies/mL    ANTIMICROBIAL SUSCEPTIBILITY 09/21/2022 Comment   Final   Comment:       ** S = Susceptible; I = Intermediate; R = Resistant **                    P = Positive; N = Negative             MICS are expressed in micrograms per mL    Antibiotic                 RSLT#1    RSLT#2    RSLT#3    RSLT#4 Ciprofloxacin                  S         R Gentamicin                               S Levofloxacin                   S         R Linezolid                                S Moxifloxacin                             R Nitrofurantoin                 S         S Oxacillin                                R Penicillin                     S         R Quinupristin/Dalfopristin                S Rifampin                                 S Tetracycline                   S         S Trimethoprim/Sulfa  S Vancomycin                     S         S    WBC, UA 09/21/2022 >30 (A)  0 - 5 /hpf Final   RBC, Urine 09/21/2022 0-2  0 - 2 /hpf Final   Epithelial Cells (non renal) 09/21/2022 0-10  0 - 10 /hpf Final   Mucus, UA 09/21/2022 Present (A)  Not Estab. Final   Bacteria, UA 09/21/2022 Many (A)  None seen/Few Final    Ref Range & Units 08/03/2022  WBC (White Blood Cell Count) 4.1 - 10.2 10^3/uL 7.2  RBC (Red Blood Cell Count) 4.69 - 6.13 10^6/uL 4.50 Low   Hemoglobin 14.1 - 18.1 gm/dL 13.9 Low   Hematocrit 40.0 - 52.0 % 42.3  MCV (Mean Corpuscular Volume) 80.0 - 100.0 fl 94.0  MCH (Mean Corpuscular Hemoglobin) 27.0 - 31.2 pg 30.9  MCHC (Mean Corpuscular Hemoglobin Concentration) 32.0 - 36.0 gm/dL 32.9  Platelet Count 150 - 450 10^3/uL 196  RDW-CV (Red Cell Distribution Width) 11.6 - 14.8 % 14.2  MPV (Mean Platelet Volume) 9.4 - 12.4 fl 10.5  Neutrophils 1.50 - 7.80 10^3/uL 4.59  Lymphocytes 1.00 - 3.60 10^3/uL 1.89  Monocytes 0.00  - 1.50 10^3/uL 0.53  Eosinophils 0.00 - 0.55 10^3/uL 0.11  Basophils 0.00 - 0.09 10^3/uL 0.05  Neutrophil % 32.0 - 70.0 % 63.8  Lymphocyte % 10.0 - 50.0 % 26.3  Monocyte % 4.0 - 13.0 % 7.4  Eosinophil % 1.0 - 5.0 % 1.5  Basophil% 0.0 - 2.0 % 0.7  Immature Granulocyte % <=0.7 % 0.3  Immature Granulocyte Count <=0.06 10^3/L 0.02  Resulting Agency  [facility] - LAB  Specimen Collected: 08/03/22 10:22   Performed by: [place]: 08/03/22 10:55  Received From: [place]  Result Received: 08/12/22 09:20    Ref Range & Units 08/03/2022  Glucose 70 - 110 mg/dL 101  Sodium 136 - 145 mmol/L 143  Potassium 3.6 - 5.1 mmol/L 4.3  Chloride 97 - 109 mmol/L 108  Carbon Dioxide (CO2) 22.0 - 32.0 mmol/L 28.0  Urea Nitrogen (BUN) 7 - 25 mg/dL 25  Creatinine 0.7 - 1.3 mg/dL 1.5 High   Glomerular Filtration Rate (eGFR) >60 mL/min/1.73sq m 49 Low   Calcium 8.7 - 10.3 mg/dL 9.9  AST 8 - 39 U/L 18  ALT 6 - 57 U/L 9  Alk Phos (alkaline Phosphatase) 34 - 104 U/L 108 High   Albumin 3.5 - 4.8 g/dL 4.2  Bilirubin, Total 0.3 - 1.2 mg/dL 1.3 High   Protein, Total 6.1 - 7.9 g/dL 7.0  A/G Ratio 1.0 - 5.0 gm/dL 1.5  Resulting Agency  [facility] - LAB  Specimen Collected: 08/03/22 10:22   Performed by: [place]: 08/03/22 15:47  Received From: [place]  Result Received: 08/12/22 09:20     ECG: Date: 04/21/2022 Time ECG obtained: 1049 AM Rate: 58 bpm Rhythm:  Sinus rhythm Axis (leads I and aVF): Normal Intervals: QRS 180 ms. QTc 496 ms. ST segment and T wave changes: No evidence of acute ST segment elevation or depression Comparison: Similar to previous tracing obtained on 08/08/2022 NOTE: Tracing obtained at [facility]; unable for review. Above based on cardiologist's interpretation.    IMAGING / PROCEDURES: CT RENAL STONE STUDY performed on 09/07/2022 No acute findings  within the abdomen or pelvis.  Nonobstructing right renal calculus.  Unchanged. Large bladder stone is unchanged from previous exam. Small bladder wall diverticula. Moderate stool burden identified within the rectum. Correlate for any clinical signs or symptoms of constipation. Fat containing umbilical and left inguinal hernias. Aortic atherosclerosis  TRANSTHORACIC ECHOCARDIOGRAM performed on 04/13/2022 Mildly reduced left ventricular systolic function with an EF of 40% Mild global hypokinesis Mild left atrial enlargement Mild pan valvular regurgitation  MYOCARDIAL PERFUSION IMAGING STUDY (LEXISCAN) performed on 04/12/2021 Normal left ventricular systolic function No evidence of stress-induced myocardial ischemia or arrhythmia; no scintigraphic evidence of scar Normal low risk study  Impression and Plan:  Wesley Anderson. has been referred for pre-anesthesia review and clearance prior to him undergoing the planned anesthetic and procedural courses. Available labs, pertinent testing, and imaging results were personally reviewed by me in preparation for upcoming operative/procedural course. [facility] medical record has been updated following extensive record review and patient interview with PAT staff.   This patient has been appropriately cleared by cardiology (LOW) and by neurology (LOW) with the individually indicated risk of significant perioperative complications.  With regards to his implanted DBS, patient has the ability to deactivate this device using industry provided device.  Device is likely to interfere with intraoperative monitoring equipment.  Neurology recommending that device be powered off during surgery, with plans to reactivate once surgery has been completed.  Patient's wife is aware to bring device with them to the hospital, patient's date of surgery.  Wife is aware how to deactivate/reactivate device.    Based on clinical review performed today (10/07/22), barring  any significant acute changes in the patient's overall condition, it is anticipated that he will be able to proceed with the planned surgical intervention. Any acute changes in clinical condition may necessitate his procedure being postponed and/or cancelled. Patient will meet with anesthesia team (MD and/or CRNA) on the day of his procedure for preoperative evaluation/assessment. Questions regarding anesthetic course will be fielded at that time.   Pre-surgical instructions were reviewed with the patient during his PAT appointment, and questions were fielded to satisfaction by PAT clinical staff. He has been instructed on which medications that he will need to hold prior to surgery, as well as the ones that have been deemed safe/appropriate to take of the day of his procedure. As part of the general education provided by PAT, patient made aware both verbally and in writing, that he would need to abstain from the use of any illegal substances during his perioperative course.  He was advised that failure to follow the provided instructions could necessitate case cancellation or result serious perioperative complications up to and including death. Patient encouraged to contact PAT and/or his surgeon's office to discuss any questions or concerns that may arise prior to surgery; verbalized understanding.   Honor Loh, MSN, APRN, FNP-C, CEN [facility]  Peri-operative Services Nurse Practitioner Phone: [phone] Fax: [phone] 10/07/22 3:03 PM  NOTE: This note has been prepared using Dragon dictation software. Despite my best ability to proofread, there is always the potential that unintentional transcriptional errors may still occur from this process.

## 2022-10-10 MED ORDER — LACTATED RINGERS IV SOLN: INTRAVENOUS | Status: DC

## 2022-10-10 MED ORDER — ORAL CARE MOUTH RINSE: 15.0000 mL | Freq: Once | OROMUCOSAL | Status: AC

## 2022-10-10 MED ORDER — CHLORHEXIDINE GLUCONATE 0.12 % MT SOLN: 15.0000 mL | Freq: Once | OROMUCOSAL | Status: AC

## 2022-10-10 MED ORDER — VANCOMYCIN HCL 1 G IV SOLR
1000.0000 mg | INTRAVENOUS | Status: DC
  Filled 2022-10-10: qty 20

## 2022-10-11 ENCOUNTER — Ambulatory Visit: Payer: Medicare HMO | Admitting: Urgent Care

## 2022-10-11 ENCOUNTER — Ambulatory Visit
Admission: RE | Admit: 2022-10-11 | Discharge: 2022-10-11 | Disposition: A | Discharge status: Home / Self Care | Payer: Medicare HMO | Attending: Urology | Admitting: Urology

## 2022-10-11 ENCOUNTER — Encounter: Payer: Self-pay | Admitting: Urology

## 2022-10-11 ENCOUNTER — Other Ambulatory Visit: Payer: Self-pay

## 2022-10-11 ENCOUNTER — Encounter
Admission: RE | Disposition: A | Discharge status: Home / Self Care | Payer: Self-pay | Source: Home / Self Care | Attending: Urology

## 2022-10-11 DIAGNOSIS — N319 Neuromuscular dysfunction of bladder, unspecified: Secondary | ICD-10-CM | POA: Diagnosis not present

## 2022-10-11 DIAGNOSIS — I509 Heart failure, unspecified: Secondary | ICD-10-CM | POA: Diagnosis not present

## 2022-10-11 DIAGNOSIS — F32A Depression, unspecified: Secondary | ICD-10-CM | POA: Diagnosis not present

## 2022-10-11 DIAGNOSIS — Z87891 Personal history of nicotine dependence: Secondary | ICD-10-CM | POA: Insufficient documentation

## 2022-10-11 DIAGNOSIS — F0280 Dementia in other diseases classified elsewhere without behavioral disturbance: Secondary | ICD-10-CM | POA: Insufficient documentation

## 2022-10-11 DIAGNOSIS — K219 Gastro-esophageal reflux disease without esophagitis: Secondary | ICD-10-CM | POA: Insufficient documentation

## 2022-10-11 DIAGNOSIS — I2510 Atherosclerotic heart disease of native coronary artery without angina pectoris: Secondary | ICD-10-CM | POA: Diagnosis not present

## 2022-10-11 DIAGNOSIS — N210 Calculus in bladder: Secondary | ICD-10-CM | POA: Diagnosis not present

## 2022-10-11 DIAGNOSIS — G20A1 Parkinson's disease without dyskinesia, without mention of fluctuations: Secondary | ICD-10-CM | POA: Diagnosis not present

## 2022-10-11 HISTORY — DX: Other complications of anesthesia, initial encounter: T88.59XA

## 2022-10-11 HISTORY — PX: CYSTOSCOPY WITH LITHOLAPAXY: SHX1425

## 2022-10-11 HISTORY — DX: Polyp of vocal cord and larynx: J38.1

## 2022-10-11 HISTORY — DX: Benign neoplasm of colon, unspecified: D12.6

## 2022-10-11 HISTORY — DX: Unspecified systolic (congestive) heart failure: I50.20

## 2022-10-11 SURGERY — CYSTOSCOPY, WITH BLADDER CALCULUS LITHOLAPAXY
Anesthesia: General | Site: Bladder

## 2022-10-11 MED ORDER — LACTATED RINGERS IV SOLN: INTRAVENOUS | Status: DC | PRN

## 2022-10-11 MED ORDER — ACETAMINOPHEN 10 MG/ML IV SOLN
INTRAVENOUS | Status: DC | PRN
  Administered 2022-10-11: 1000 mg via INTRAVENOUS

## 2022-10-11 MED ORDER — OXYBUTYNIN CHLORIDE 5 MG PO TABS
ORAL_TABLET | ORAL | Status: AC
  Filled 2022-10-11: qty 1

## 2022-10-11 MED ORDER — VANCOMYCIN HCL 1000 MG IV SOLR
INTRAVENOUS | Status: DC | PRN
  Administered 2022-10-11: 1000 mg via INTRAVENOUS

## 2022-10-11 MED ORDER — SODIUM CHLORIDE 0.9 % IR SOLN
Status: DC | PRN
  Administered 2022-10-11 (×2): 3000 mL via INTRAVESICAL
  Administered 2022-10-11: 6000 mL
  Administered 2022-10-11 (×2): 3000 mL via INTRAVESICAL

## 2022-10-11 MED ORDER — STERILE WATER FOR IRRIGATION IR SOLN
Status: DC | PRN
  Administered 2022-10-11: 500 mL

## 2022-10-11 MED ORDER — VANCOMYCIN HCL IN DEXTROSE 1-5 GM/200ML-% IV SOLN
INTRAVENOUS | Status: AC
  Filled 2022-10-11: qty 200

## 2022-10-11 MED ORDER — CHLORHEXIDINE GLUCONATE 0.12 % MT SOLN
OROMUCOSAL | Status: AC
  Administered 2022-10-11: 15 mL via OROMUCOSAL
  Filled 2022-10-11: qty 15

## 2022-10-11 MED ORDER — FENTANYL CITRATE (PF) 100 MCG/2ML IJ SOLN: 25.0000 ug | INTRAMUSCULAR | Status: DC | PRN

## 2022-10-11 MED ORDER — ONDANSETRON HCL 4 MG/2ML IJ SOLN
INTRAMUSCULAR | Status: DC | PRN
  Administered 2022-10-11: 4 mg via INTRAVENOUS

## 2022-10-11 MED ORDER — PROPOFOL 1000 MG/100ML IV EMUL
INTRAVENOUS | Status: AC
  Filled 2022-10-11: qty 100

## 2022-10-11 MED ORDER — OXYBUTYNIN CHLORIDE 5 MG PO TABS
5.0000 mg | ORAL_TABLET | Freq: Once | ORAL | Status: AC
  Administered 2022-10-11: 5 mg via ORAL

## 2022-10-11 MED ORDER — PROPOFOL 10 MG/ML IV BOLUS
INTRAVENOUS | Status: AC
  Filled 2022-10-11: qty 40

## 2022-10-11 MED ORDER — ACETAMINOPHEN 10 MG/ML IV SOLN
INTRAVENOUS | Status: AC
  Filled 2022-10-11: qty 100

## 2022-10-11 MED ORDER — DEXMEDETOMIDINE HCL IN NACL 80 MCG/20ML IV SOLN
INTRAVENOUS | Status: DC | PRN
  Administered 2022-10-11 (×3): 4 ug via BUCCAL

## 2022-10-11 MED ORDER — LIDOCAINE HCL (CARDIAC) PF 100 MG/5ML IV SOSY
PREFILLED_SYRINGE | INTRAVENOUS | Status: DC | PRN
  Administered 2022-10-11: 80 mg via INTRAVENOUS

## 2022-10-11 MED ORDER — PROPOFOL 10 MG/ML IV BOLUS
INTRAVENOUS | Status: DC | PRN
  Administered 2022-10-11: 150 ug/kg/min via INTRAVENOUS
  Administered 2022-10-11: 30 mg via INTRAVENOUS
  Administered 2022-10-11: 130 mg via INTRAVENOUS
  Administered 2022-10-11 (×2): 20 mg via INTRAVENOUS

## 2022-10-11 MED ORDER — PROPOFOL 10 MG/ML IV BOLUS
INTRAVENOUS | Status: AC
  Filled 2022-10-11: qty 60

## 2022-10-11 SURGICAL SUPPLY — 27 items
ADAPTER IRRIG TUBE 2 SPIKE SOL (ADAPTER) IMPLANT
ADPR TBG 2 SPK PMP STRL ASCP (ADAPTER) ×1
BAG DRAIN SIEMENS DORNER NS (MISCELLANEOUS) ×2 IMPLANT
BAG DRN NS LF (MISCELLANEOUS) ×1
BAG DRN RND TRDRP ANRFLXCHMBR (UROLOGICAL SUPPLIES) ×1
BAG URINE DRAIN 2000ML AR STRL (UROLOGICAL SUPPLIES) IMPLANT
BASKET ZERO TIP 1.9FR (BASKET) IMPLANT
BSKT STON RTRVL ZERO TP 1.9FR (BASKET)
CATH FOL 2WAY LX 18X30 (CATHETERS) IMPLANT
CATH URETL OPEN END 4X70 (CATHETERS) IMPLANT
FIBER LASER MOSES 200 DFL (Laser) IMPLANT
FIBER LASER MOSES 550 DFL (Laser) IMPLANT
GLOVE SURG UNDER POLY LF SZ7.5 (GLOVE) ×2 IMPLANT
GOWN STRL REUS W/ TWL LRG LVL3 (GOWN DISPOSABLE) ×4 IMPLANT
GOWN STRL REUS W/ TWL XL LVL3 (GOWN DISPOSABLE) ×2 IMPLANT
GOWN STRL REUS W/TWL LRG LVL3 (GOWN DISPOSABLE) ×2
GOWN STRL REUS W/TWL XL LVL3 (GOWN DISPOSABLE) ×1
HOLDER FOLEY CATH W/STRAP (MISCELLANEOUS) IMPLANT
IV NS IRRIG 3000ML ARTHROMATIC (IV SOLUTION) ×4 IMPLANT
KIT TURNOVER CYSTO (KITS) ×2 IMPLANT
PACK CYSTO AR (MISCELLANEOUS) ×2 IMPLANT
SET IRRIG Y TYPE TUR BLADDER L (SET/KITS/TRAYS/PACK) ×2 IMPLANT
SURGILUBE 2OZ TUBE FLIPTOP (MISCELLANEOUS) ×2 IMPLANT
SYR TOOMEY IRRIG 70ML (MISCELLANEOUS) ×1
SYRINGE TOOMEY IRRIG 70ML (MISCELLANEOUS) ×2 IMPLANT
WATER STERILE IRR 1000ML POUR (IV SOLUTION) ×2 IMPLANT
WATER STERILE IRR 3000ML UROMA (IV SOLUTION) IMPLANT

## 2022-10-11 NOTE — Progress Notes (Unsigned)
Catheter Removal  Patient is present today for a catheter removal.  65 ml of water was drained from the balloon. A 18 FR foley cath was removed from the bladder, no complications were noted. Patient tolerated well, mild discomfort.  Performed by: Concepcion Living, LPN  Follow up/ Additional notes: follow up as scheduled with Dr Bernardo Heater. Patient aware to call with any questions or signs of infection.

## 2022-10-11 NOTE — Interval H&P Note (Signed)
History and Physical Interval Note:  CV:RRR Lungs:clear  10/11/2022 8:11 AM  Wesley Anderson.  has presented today for surgery, with the diagnosis of Bladder Stone.  The various methods of treatment have been discussed with the patient and family. After consideration of risks, benefits and other options for treatment, the patient has consented to  Procedure(s): CYSTOSCOPY WITH LITHOLAPAXY (N/A) as a surgical intervention.  The patient's history has been reviewed, patient examined, no change in status, stable for surgery.  I have reviewed the patient's chart and labs.  Questions were answered to the patient's satisfaction.     [place]

## 2022-10-11 NOTE — Anesthesia Preprocedure Evaluation (Addendum)
Anesthesia Evaluation  Patient identified by MRN, date of birth, ID band Patient awake    Reviewed: Allergy & Precautions, NPO status , Patient's Chart, lab work & pertinent test results  History of Anesthesia Complications (+) Emergence Delirium and history of anesthetic complications  Airway Mallampati: III  TM Distance: >3 FB Neck ROM: full    Dental  (+) Chipped   Pulmonary former smoker   Pulmonary exam normal        Cardiovascular + CAD and +CHF  Normal cardiovascular exam+ dysrhythmias (SB)    Myocardial perfusion imaging study performed on 04/12/2021 revealed a normal left ventricular systolic function with an EF of >55%.  There was no evidence of stress-induced myocardial ischemia or arrhythmia; no scintigraphic evidence of scar.  Study determined to be normal low risk overall.    Most recent TTE was performed on 04/13/2022 revealing a mildly reduced left ventricular systolic function with an EF of 40%.  There was global hypokinesis.  Left atrium mildly enlarged.  There was mild paravalvular regurgitation observed.  All transvalvular gradients were noted to be normal providing no evidence suggestive of valvular stenosis.    Neuro/Psych  PSYCHIATRIC DISORDERS  Depression   Dementia Parkinson's disease  negative neurological ROS     GI/Hepatic Neg liver ROS,GERD  ,,  Endo/Other  negative endocrine ROS    Renal/GU Renal disease     Musculoskeletal   Abdominal   Peds  Hematology  (+) Blood dyscrasia, anemia   Anesthesia Other Findings Past Medical History: No date: Alcohol dependence in remission ([place]) No date: Anemia No date: Aortic atherosclerosis ([place]) No date: Aortic ectasia, thoracoabdominal (HCC)     Comment:  a.) [place] retroperitoneal 05/31/2016: abd Ao measured 2.7               cm; b.) CT chest 11/16/2020: asc Ao measured 4.0 cm; c.)               CT chest 01/22/2021: asc Ao measured 4.0 cm; d.) CT  chest              03/26/2021: asc Ao measured 4.0 cm No date: Bladder stones No date: BPH (benign prostatic hyperplasia) No date: Complication of anesthesia     Comment:  a.) (+) postoperative delirium No date: Constipation No date: Coronary artery calcification seen on CT scan No date: Depression No date: ED (erectile dysfunction) No date: Fall at home No date: GERD (gastroesophageal reflux disease) No date: Hematest positive stools No date: HFrEF (heart failure with reduced ejection fraction) ([place])     Comment:  a.) TTE 04/13/2022: EF 40%, glob HK, mild panvalvular               regurg. 04/22/2022: History of 2019 novel coronavirus disease (COVID-19) No date: History of kidney stones 07/03/2020: History of postoperative delirium     Comment:  a.) in the setting of GA for skin cancer removal;               combativeness + acute worsening AMS + worsening               memory/overall cognition persistent for several days               following procedure No date: Incontinence of urine No date: Left inguinal hernia No date: Melanoma of skin ([place]) No date: Nephrolithiasis No date: Parkinson's disease     Comment:  a.) RIGHT STN-DBS in place No date: Parkinson's disease dementia ([place])  Comment:  a.) on donepezil No date: 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) No date: Renal cyst, right No date: Restless leg syndrome No date: Sepsis ([place]) No date: Sinus bradycardia No date: Sleep difficulties     Comment:  a.) takes melatonin No date: Squamous cell carcinoma of scalp     Comment:  a.) s/p flap excision at [facility] 07/03/2020 under general               anesthesia; procedure complicated by (+) postoperative               delirium 10/24/2013: Status post deep brain stimulator placement     Comment:  a.) s/p RIGHT STN-DBS placement No date: Tubular adenoma of colon No date: Umbilical hernia No date: UTI (urinary tract infection) No date: Vocal cord polyp      Comment:  a.) s/p removal in 1973  Past Surgical History: No date: CHOLECYSTECTOMY 11/07/2016: COLONOSCOPY WITH PROPOFOL; N/A     Comment:  Procedure: COLONOSCOPY WITH PROPOFOL;  Surgeon: Lollie Sails, MD;  Location: [facility] ENDOSCOPY;  Service:               Endoscopy;  Laterality: N/A; 09/06/2018: COLONOSCOPY WITH PROPOFOL; N/A     Comment:  Procedure: COLONOSCOPY WITH PROPOFOL;  Surgeon:               Lollie Sails, MD;  Location: [facility] ENDOSCOPY;                Service: Endoscopy;  Laterality: N/A; 05/16/2019: COLONOSCOPY WITH PROPOFOL; N/A     Comment:  Procedure: COLONOSCOPY WITH PROPOFOL;  Surgeon:               Lollie Sails, MD;  Location: [facility] ENDOSCOPY;                Service: Endoscopy;  Laterality: N/A; No date: CYST EXCISION     Comment:  vocal cord 10/24/2013: DEEP BRAIN STIMULATOR PLACEMENT; Right 09/06/2018: ESOPHAGOGASTRODUODENOSCOPY (EGD) WITH PROPOFOL; N/A     Comment:  Procedure: ESOPHAGOGASTRODUODENOSCOPY (EGD) WITH               PROPOFOL;  Surgeon: Lollie Sails, MD;  Location:               [facility] ENDOSCOPY;  Service: Endoscopy;  Laterality: N/A; No date: LAPAROSCOPIC PARTIAL COLECTOMY No date: MELANOMA EXCISION No date: TONSILLECTOMY     Comment:  age 12     Reproductive/Obstetrics negative OB ROS                             Anesthesia Physical Anesthesia Plan  ASA: 3  Anesthesia Plan: General LMA   Post-op Pain Management:    Induction: Intravenous  PONV Risk Score and Plan: 2 and Propofol infusion and TIVA  Airway Management Planned: LMA  Additional Equipment:   Intra-op Plan:   Post-operative Plan: Extubation in OR  Informed Consent: I have reviewed the patients History and Physical, chart, labs and discussed the procedure including the risks, benefits and alternatives for the proposed anesthesia with the patient or authorized representative who has indicated his/her understanding and  acceptance.     Dental Advisory Given  Plan Discussed with: Anesthesiologist, CRNA and Surgeon  Anesthesia Plan Comments: (Patient consented for risks of anesthesia including but not limited to:  - adverse reactions to  medications - damage to eyes, teeth, lips or other oral mucosa - nerve damage due to positioning  - sore throat or hoarseness - Damage to heart, brain, nerves, lungs, other parts of body or loss of life  Patient voiced understanding.)       Anesthesia Quick Evaluation

## 2022-10-11 NOTE — Anesthesia Postprocedure Evaluation (Signed)
Anesthesia Post Note  Patient: Wesley Anderson.  Procedure(s) Performed: CYSTOSCOPY WITH LITHOLAPAXY (Bladder)  Patient location during evaluation: PACU Anesthesia Type: General Level of consciousness: awake and alert Pain management: pain level controlled Vital Signs Assessment: post-procedure vital signs reviewed and stable Respiratory status: spontaneous breathing, nonlabored ventilation, respiratory function stable and patient connected to nasal cannula oxygen Cardiovascular status: blood pressure returned to baseline and stable Postop Assessment: no apparent nausea or vomiting Anesthetic complications: no   No notable events documented.   Last Vitals:  Vitals:   10/11/22 1100 10/11/22 1115  BP: (!) 146/80   Pulse: (!) 42 62  Resp: 15   Temp: (!) 36.2 C   SpO2: 100%     Last Pain:  Vitals:   10/11/22 1100  TempSrc:   PainSc: 3                  Ilene Qua

## 2022-10-11 NOTE — Discharge Instructions (Addendum)
AMBULATORY SURGERY  DISCHARGE INSTRUCTIONS   The drugs that you were given will stay in your system until tomorrow so for the next 24 hours you should not:  Drive an automobile Make any legal decisions Drink any alcoholic beverage   You may resume regular meals tomorrow.  Today it is better to start with liquids and gradually work up to solid foods.  You may eat anything you prefer, but it is better to start with liquids, then soup and crackers, and gradually work up to solid foods.   Please notify your doctor immediately if you have any unusual bleeding, trouble breathing, redness and pain at the surgery site, drainage, fever, or pain not relieved by medication.    Additional Instructions:  Blood in urine is normal.  Please call for catheter drainage problems Call office for fever >101 degrees or any other questions or concerns Appointment scheduled 10/12/2022 and office for catheter removal      Please contact your physician with any problems or Same Day Surgery at [phone], Monday through Friday 6 am to 4 pm, or [place] at [facility] number at [phone].

## 2022-10-11 NOTE — Transfer of Care (Signed)
Immediate Anesthesia Transfer of Care Note  Patient: Johanna Bolyard.  Procedure(s) Performed: CYSTOSCOPY WITH LITHOLAPAXY (Bladder)  Patient Location: PACU  Anesthesia Type:General  Level of Consciousness: awake and drowsy  Airway & Oxygen Therapy: Patient Spontanous Breathing and Patient connected to face mask oxygen  Post-op Assessment: Report given to RN and Post -op Vital signs reviewed and stable  Post vital signs: Reviewed  Last Vitals:  Vitals Value Taken Time  BP 136/81 10/11/22 1036  Temp 45F   Pulse 46 10/11/22 1037  Resp 20 10/11/22 1037  SpO2 99 % 10/11/22 1037  Vitals shown include unvalidated device data.  Last Pain:  Vitals:   10/11/22 0803  TempSrc: Temporal  PainSc: 0-No pain         Complications: No notable events documented.

## 2022-10-11 NOTE — Op Note (Signed)
Preoperative diagnosis:  Bladder calculus (>2.5 cm)  Postoperative diagnosis:  Same  Procedure: Cystoscopy with faxing  Surgeon: Abbie Sons, MD  Anesthesia: General  Complications: None  Intraoperative findings:  Cystoscopy-urethra normal in caliber without stricture.  Prostate nonocclusive with mild bladder neck elevation.  Large calculus identified in dependent portion of the bladder trabeculated bladder with scattered diverticula.  No solid or papillary bladder lesions  EBL: Minimal  Specimens: None  Indication: Wesley Anderson. is a 77 y.o. 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  Stone density ~ 1200 HU.  After reviewing the management options for treatment, he elected to proceed with the above surgical procedure(s). We have discussed the potential benefits and risks of the procedure, side effects of the proposed treatment, the likelihood of the patient achieving the goals of the procedure, and any potential problems that might occur during the procedure or recuperation. Informed consent has been obtained.  Description of procedure:  The patient was taken to the operating room and general anesthesia was induced.  The patient was placed in the dorsal lithotomy position, prepped and draped in the usual sterile fashion, and preoperative antibiotics were administered. A preoperative time-out was performed.   The urethral meatus would not except a 24 French continuous-flow resectoscope sheath and the distal urethra was dilated with [employer] sounds from 20-30 [place] without difficulty.  The resectoscope sheath with obturator was lubricated and easily advanced into the bladder.  The telescope with a laser bridge was then placed into the sheath.  Panendoscopy was performed with findings as described above.  A 4 French open ended ureteral catheter was placed through the working channel followed by a  500 m Moses holmium laser fiber.  At a power setting of 0.3J/120 Hz a combination of dusting and chipping of the calculus was performed.  Setting was subsequently changed to 0.6J/100 Hz.  Once completely treated all fragments were then irrigated from the bladder.  Thorough cystoscopy was performed and with the exception of fine powder no significantly sized stone fragments remaining.  The resectoscope was removed and a 97 French Foley catheter was placed with 15 mL of sterile water placed to the balloon.  The cath was irrigated with return of pink-tinged effluent.  After anesthetic reversal he was transported to the PACU in stable condition.  Plan: Scheduled for office follow-up 10/12/2022 for catheter removal   Abbie Sons, M.D.

## 2022-10-12 ENCOUNTER — Encounter: Payer: Self-pay | Admitting: Urology

## 2022-10-12 ENCOUNTER — Ambulatory Visit: Payer: Medicare HMO | Admitting: Urology

## 2022-10-12 VITALS — BP 140/77 | HR 80

## 2022-10-12 DIAGNOSIS — Z87442 Personal history of urinary calculi: Secondary | ICD-10-CM

## 2022-10-12 DIAGNOSIS — N210 Calculus in bladder: Secondary | ICD-10-CM

## 2022-10-12 NOTE — Patient Instructions (Signed)
Follow up as scheduled.  

## 2022-11-10 ENCOUNTER — Encounter: Payer: Self-pay | Admitting: Urology

## 2022-11-10 ENCOUNTER — Ambulatory Visit: Payer: Medicare HMO | Admitting: Urology

## 2022-11-10 VITALS — BP 106/64 | HR 160 | Ht 69.0 in | Wt 178.3 lb

## 2022-11-10 DIAGNOSIS — N319 Neuromuscular dysfunction of bladder, unspecified: Secondary | ICD-10-CM | POA: Diagnosis not present

## 2022-11-10 DIAGNOSIS — Z87442 Personal history of urinary calculi: Secondary | ICD-10-CM

## 2022-11-10 DIAGNOSIS — Z09 Encounter for follow-up examination after completed treatment for conditions other than malignant neoplasm: Secondary | ICD-10-CM

## 2022-11-10 NOTE — Progress Notes (Signed)
I, DeAsia L Maxie,acting as a scribe for Abbie Sons, MD.,have documented all relevant documentation on the behalf of Abbie Sons, MD,as directed by  Abbie Sons, MD while in the presence of Abbie Sons, MD.   I, Amy L Pierron,acting as a scribe for Abbie Sons, MD.,have documented all relevant documentation on the behalf of Abbie Sons, MD,as directed by  Abbie Sons, MD while in the presence of Abbie Sons, MD.  11/10/2022 3:22 PM   Wesley Anderson 1946/02/04 [id number]  Referring provider: Adin Hector, MD [facility] [place],  [place] [address]  Chief Complaint  Patient presents with   Follow-up   Urologic History: Bladder calculus Followed by Dr. 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 This was also present on a CT December 2022 and measured 2.5 cm at that time Cystoscopy by Dr. Matilde Sprang 08/29/2022 remarkable for lateral lobe enlargement and mild bladder neck elevation. Calculus was identified in the bladder however visualization suboptimal secondary to urine sediment  HPI: 77 y.o. male presents today for a one month post-op follow-up. He is accompanied by his wife.  Status post cystolitholapaxy 10/08/2022 for a 3 cm bladder calculus.  Catheter was removed on the first post-operative day. Post op has noted significant improvement in his urinary urgency, dysuria, and incontinence. Remains on [place]. UA today: microscopy 11-30 WBC's.  PMH: Past Medical History:  Diagnosis Date   Alcohol dependence in remission ([place])    Anemia    Aortic atherosclerosis (HCC)    Aortic ectasia, thoracoabdominal ([place])    a.) [place] retroperitoneal 05/31/2016: abd Ao measured 2.7 cm; b.) CT chest 11/16/2020: asc Ao measured 4.0 cm; c.)  CT chest 01/22/2021: asc Ao measured 4.0 cm; d.) CT chest 03/26/2021: asc Ao measured 4.0  cm   Bladder stones    BPH (benign prostatic hyperplasia)    Complication of anesthesia    a.) (+) postoperative delirium   Constipation    Coronary artery calcification seen on CT scan    Depression    ED (erectile dysfunction)    Fall at home    GERD (gastroesophageal reflux disease)    Hematest positive stools    HFrEF (heart failure with reduced ejection fraction) ([place])    a.) TTE 04/13/2022: EF 40%, glob HK, mild panvalvular regurg.   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)    Renal cyst, right    Restless leg syndrome    Sepsis ([place])    Sinus bradycardia    Sleep difficulties    a.) takes melatonin   Squamous cell carcinoma of scalp    a.) s/p flap excision at [facility] 07/03/2020 under general anesthesia; procedure complicated by (+) postoperative delirium   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   a.) s/p  RIGHT STN-DBS placement   Tubular adenoma of colon    Umbilical hernia    UTI (urinary tract infection)    Vocal cord polyp    a.) s/p removal in 1973    Surgical History: Past Surgical History:  Procedure Laterality Date   CHOLECYSTECTOMY     COLONOSCOPY WITH PROPOFOL N/A 11/07/2016   Procedure: COLONOSCOPY WITH PROPOFOL;  Surgeon: Lollie Sails, MD;  [facility] ENDOSCOPY;  Service: Endoscopy;  Laterality: N/A;   COLONOSCOPY WITH PROPOFOL N/A 09/06/2018   Procedure: COLONOSCOPY WITH PROPOFOL;  Surgeon: Lollie Sails, MD;  Location: [facility] ENDOSCOPY;  Service: Endoscopy;  Laterality: N/A;   COLONOSCOPY WITH PROPOFOL N/A 05/16/2019   Procedure: COLONOSCOPY WITH PROPOFOL;  Surgeon: Lollie Sails, MD;  Location: [facility] ENDOSCOPY;  Service: Endoscopy;  Laterality: N/A;   CYST EXCISION     vocal cord   CYSTOSCOPY WITH LITHOLAPAXY N/A 10/11/2022   Procedure: CYSTOSCOPY WITH LITHOLAPAXY;  Surgeon: Abbie Sons, MD;  Location: [facility] ORS;  Service: Urology;  Laterality: N/A;   DEEP BRAIN STIMULATOR PLACEMENT Right 10/24/2013   ESOPHAGOGASTRODUODENOSCOPY (EGD) WITH PROPOFOL N/A 09/06/2018   Procedure: ESOPHAGOGASTRODUODENOSCOPY (EGD) WITH PROPOFOL;  Surgeon: Lollie Sails, MD;  Location: [facility] ENDOSCOPY;  Service: Endoscopy;  Laterality: N/A;   LAPAROSCOPIC PARTIAL COLECTOMY     MELANOMA EXCISION     TONSILLECTOMY     age 23    Home Medications:  Allergies as of 11/10/2022       Reactions   Artane [trihexyphenidyl] Anxiety   Prochlorperazine Nausea And Vomiting   Propofol Other (See Comments)   Delirium Delirium Delirium Delirium   Reglan [metoclopramide]    To not be given pt has Parkinson's        Medication List        Accurate as of November 10, 2022  3:22 PM. If you have any questions, ask your nurse or doctor.          carbidopa-levodopa 25-100 MG tablet Commonly known as: SINEMET IR Take 2 tablets by mouth 4 (four) times daily.   donepezil 10 MG tablet Commonly known as: ARICEPT Take 10 mg by mouth at bedtime.   doxycycline 100 MG capsule Commonly known as: VIBRAMYCIN Take 1 capsule (100 mg total) by mouth every 12 (twelve) hours.   Elderberry Zinc/Vit C/Immune Lozg Use as directed 1 lozenge in the mouth or throat daily.   Gemtesa 75 MG Tabs Generic drug: Vibegron Take 1 tablet by mouth every evening.   melatonin 3 MG Tabs tablet Take 9 mg by mouth at bedtime.   multivitamin tablet Take 1 tablet by mouth daily.   pantoprazole 40 MG tablet Commonly  known as: PROTONIX Take 40 mg by mouth 2 (two) times daily.   polyethylene glycol 17 g packet Commonly known as: MIRALAX / GLYCOLAX Take 17 g by mouth daily as needed for mild constipation or moderate constipation. What changed: when to take this   PROBIOTIC DIGESTIVE SUPPORT PO Take 1 tablet by mouth daily at 6 (six) AM.   QUEtiapine 25 MG tablet Commonly known as: SEROQUEL Take 75 mg by mouth at bedtime.   sertraline 100 MG tablet Commonly known as: ZOLOFT Take 100 mg by mouth every morning.   tamsulosin 0.4 MG Caps capsule Commonly known as: FLOMAX Take 0.4 mg by mouth daily after supper.        Allergies:  Allergies  Allergen Reactions   Artane [Trihexyphenidyl] Anxiety   Prochlorperazine Nausea And Vomiting  Propofol Other (See Comments)    Delirium Delirium Delirium Delirium    Reglan [Metoclopramide]     To not be given pt has Parkinson's    Family History: Family History  Problem Relation Age of Onset   Diabetes Father     Social History:  reports that he has quit smoking. His smoking use included cigarettes. He has never used smokeless tobacco. He reports that he does not currently use alcohol after a past usage of about 6.0 standard drinks of alcohol per week. He reports that he does not use drugs.   Physical Exam: BP 106/64   Pulse (!) 160   Ht 5\' 9"  (1.753 m)   Wt 178 lb 4.8 oz (80.9 kg)   BMI 26.33 kg/m   Constitutional:  Alert, No acute distress. HEENT: [place] AT Respiratory: Normal respiratory effort, no increased work of breathing. Psychiatric: Normal mood and affect.  Laboratory Data:  Urinalysis Microscopy 11-30 WBC's.  Assessment & Plan:    1.  History of bladder calculus Doing well s/p cystolitholapaxy.  UA today with pyuria, however his is asymptomatic.  Urine culture ordered.  2. Neurogenic bladder Presently on Gemtesa. We did discuss a brief holiday of Gemtesa to reassess his symptoms after cystolitholapaxy versus  continuing the medication. They would like to think this over but leaning towards continuing. Follow up appointment with Dr. Matilde Sprang in 3 months.   I have reviewed the above documentation for accuracy and completeness, and I agree with the above.   Abbie Sons, [place] [address] [place], [place] [address] [phone]

## 2022-11-11 LAB — MICROSCOPIC EXAMINATION

## 2022-11-11 LAB — URINALYSIS, COMPLETE
Bilirubin, UA: NEGATIVE
Glucose, UA: NEGATIVE
Ketones, UA: NEGATIVE
Nitrite, UA: NEGATIVE
Protein,UA: NEGATIVE
RBC, UA: NEGATIVE
Specific Gravity, UA: 1.02 (ref 1.005–1.030)
Urobilinogen, Ur: 0.2 mg/dL (ref 0.2–1.0)
pH, UA: 6.5 (ref 5.0–7.5)

## 2022-11-15 LAB — CULTURE, URINE COMPREHENSIVE

## 2022-11-16 ENCOUNTER — Telehealth: Payer: Self-pay | Admitting: *Deleted

## 2022-11-16 ENCOUNTER — Other Ambulatory Visit: Payer: Self-pay | Admitting: *Deleted

## 2022-11-16 MED ORDER — CIPROFLOXACIN HCL 250 MG PO TABS: 250.0000 mg | ORAL_TABLET | Freq: Two times a day (BID) | ORAL | 0 refills | Status: AC

## 2022-11-16 NOTE — Telephone Encounter (Signed)
Notified patient wife  as instructed, patient pleased.

## 2022-11-16 NOTE — Telephone Encounter (Signed)
-----   Message from Abbie Sons, MD sent at 11/16/2022  7:30 AM EDT ----- Urine culture growing low levels of 2 different bacteria.  Please send Rx Cipro 250 mg twice daily x 7 days

## 2022-12-08 ENCOUNTER — Ambulatory Visit (INDEPENDENT_AMBULATORY_CARE_PROVIDER_SITE_OTHER): Payer: Medicare HMO | Admitting: Vascular Surgery

## 2022-12-15 ENCOUNTER — Ambulatory Visit (INDEPENDENT_AMBULATORY_CARE_PROVIDER_SITE_OTHER): Payer: Medicare HMO | Admitting: Vascular Surgery

## 2023-01-12 ENCOUNTER — Ambulatory Visit (INDEPENDENT_AMBULATORY_CARE_PROVIDER_SITE_OTHER): Payer: Medicare HMO | Admitting: Vascular Surgery

## 2023-02-06 ENCOUNTER — Ambulatory Visit (INDEPENDENT_AMBULATORY_CARE_PROVIDER_SITE_OTHER): Payer: Medicare HMO | Admitting: Urology

## 2023-02-06 VITALS — BP 96/67 | HR 63 | Ht 69.0 in | Wt 178.0 lb

## 2023-02-06 DIAGNOSIS — N319 Neuromuscular dysfunction of bladder, unspecified: Secondary | ICD-10-CM

## 2023-02-06 DIAGNOSIS — N3941 Urge incontinence: Secondary | ICD-10-CM

## 2023-02-06 LAB — URINALYSIS, COMPLETE
Bilirubin, UA: NEGATIVE
Glucose, UA: NEGATIVE
Ketones, UA: NEGATIVE
Nitrite, UA: NEGATIVE
RBC, UA: NEGATIVE
Specific Gravity, UA: >1.03 — ABNORMAL HIGH (ref 1.005–1.030)
Urobilinogen, Ur: 0.2 mg/dL (ref 0.2–1.0)
pH, UA: 5.5 (ref 5.0–7.5)

## 2023-02-06 LAB — MICROSCOPIC EXAMINATION: Epithelial Cells (non renal): >10 /hpf — AB (ref 0–10)

## 2023-02-06 MED ORDER — GEMTESA 75 MG PO TABS: 1.0000 | ORAL_TABLET | Freq: Every evening | ORAL | 11 refills | Status: DC

## 2023-02-06 NOTE — Progress Notes (Signed)
02/06/2023 1:17 PM   Wesley Anderson 02-Apr-1946 [id number]  Referring provider: Lynnea Ferrier, MD [address] [facility] [place],  [place] [address]  Chief Complaint  Patient presents with   Follow-up    HPI: Patient has high-volume urge incontinence and high-volume bedwetting.  He actually does not get up to urinate and uses pads.  He can wear 6 pads a day moderately wet or soaked.  Last visit he was given Myrbetriq.  Leslye Peer had initially worked and then stopped working.  He had been on Flomax   He voids every 1-2 hours and has no nocturia.   He has had Parkinson's for many years.  His wife thinks he gets multiple bladder infections with dysuria and just finished an antibiotic on Friday.  The last 2 cultures have been negative but he has had positive cultures.   He tends toward significant constipation   Patient has urge incontinence and neurogenic bladder.  I am not convinced that urodynamics would change the management.  He had red blood cells in the urine today but it looked infected.  Recognizing potential limitations I decided to give him ciprofloxacin 500 mg twice a day for 7 days and put him on trimethoprim 100 mg daily 30x11.  Have him come back and see if he down regulate in about 6 weeks for cystoscopy.  Antimuscarinics may not be ideal due to Parkinson's.  Not a good candidate for InterStim or Botox but good candidate for percutaneous tibial nerve stimulation.     The Leslye Peer was working for short while and perhaps he failed because of infections.  Patient had a normal renal ultrasound in October 2020   Last culture positive sensitive to ciprofloxacin and Macrodantin.  He still has some burning.  Urine looked positive.  He still leaking a lot on Gemtesa and on trimethoprim   Cystoscopy: Patient underwent flexible cystoscopy.  Penile bulbar urethra normal.  He had bilobar enlargement of the prostate.  He had a little bit of elevated bladder neck.   Urine was very cloudy with white flecks and sediment.  It was difficult to see the urothelium throughout the bladder but it looked erythematous.  He appeared to have at least a 2 cm stone in the midline and visibility was modest at best    [facility] on last culture I will call in ciprofloxacin 250 mg twice a day for 7 days and put him on daily Macrodantin 100 mg. I will get CT stone protocol and he will follow-up with Dr. Lonna Cobb and likely will need a transurethral removal of bladder stone. If we can sterilize his urine he may do well with the medication such as Gemtesa. He will need bladder stone removed to sterilize his urine. He will need a postvoid residual urine volume in the future. His residual May 25, 2022 was 106 mL. I think is very reasonable to go back and try Gemtesa after he sterilized   Today Patient had transurethral removal of bladder stone on October 11, 2022.  It was approximately 3 cm in size also seen on CT scan.  He reported he was doing better in terms of his urgency on Gemtesa when he saw Dr. Kathie Rhodes postoperatively.  No infections and is not on trimethoprim.  He has almost complete control and is very pleased flow stable   PMH: Past Medical History:  Diagnosis Date   Alcohol dependence in remission (HCC)    Anemia    Aortic atherosclerosis (HCC)  Aortic ectasia, thoracoabdominal (HCC)    a.) [place] retroperitoneal 05/31/2016: abd Ao measured 2.7 cm; b.) CT chest 11/16/2020: asc Ao measured 4.0 cm; c.)  CT chest 01/22/2021: asc Ao measured 4.0 cm; d.) CT chest 03/26/2021: asc Ao measured 4.0 cm   Bladder stones    BPH (benign prostatic hyperplasia)    Complication of anesthesia    a.) (+) postoperative delirium   Constipation    Coronary artery calcification seen on CT scan    Depression    ED (erectile dysfunction)    Fall at home    GERD (gastroesophageal reflux disease)    Hematest positive stools    HFrEF (heart failure with reduced ejection fraction) (HCC)    a.) TTE  04/13/2022: EF 40%, glob HK, mild panvalvular regurg.   History of 2019 novel coronavirus disease (COVID-19) 04/22/2022   History of kidney stones    History of postoperative delirium 07/03/2020   a.) in the setting of GA for skin cancer removal; combativeness + acute worsening AMS + worsening memory/overall cognition persistent for several days following procedure   Incontinence of urine    Left inguinal hernia    Melanoma of skin (HCC)    Nephrolithiasis    Parkinson's disease    a.) RIGHT STN-DBS in place   Parkinson's disease dementia (HCC)    a.) on donepezil   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)    Renal cyst, right    Restless leg syndrome    Sepsis (HCC)    Sinus bradycardia    Sleep difficulties    a.) takes melatonin   Squamous cell carcinoma of scalp    a.) s/p flap excision at [facility] 07/03/2020 under general anesthesia; procedure complicated by (+) postoperative delirium   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   a.) s/p RIGHT STN-DBS placement   Tubular adenoma of colon    Umbilical hernia    UTI (urinary tract infection)    Vocal cord polyp    a.) s/p removal in 1973    Surgical History: Past Surgical History:  Procedure Laterality Date   CHOLECYSTECTOMY     COLONOSCOPY WITH PROPOFOL N/A 11/07/2016   Procedure: COLONOSCOPY WITH PROPOFOL;  Surgeon: Christena Deem, MD;  Location: [facility] ENDOSCOPY;  Service: Endoscopy;  Laterality: N/A;   COLONOSCOPY WITH PROPOFOL N/A 09/06/2018   Procedure: COLONOSCOPY WITH PROPOFOL;  Surgeon: Christena Deem, MD;  Location: [facility] ENDOSCOPY;  Service: Endoscopy;  Laterality: N/A;   COLONOSCOPY WITH PROPOFOL N/A 05/16/2019   Procedure: COLONOSCOPY WITH PROPOFOL;  Surgeon: Christena Deem, MD;  Location: [facility] ENDOSCOPY;  Service: Endoscopy;  Laterality: N/A;   CYST EXCISION     vocal cord   CYSTOSCOPY WITH LITHOLAPAXY N/A 10/11/2022   Procedure: CYSTOSCOPY WITH LITHOLAPAXY;  Surgeon:  Riki Altes, MD;  Location: [facility] ORS;  Service: Urology;  Laterality: N/A;   DEEP BRAIN STIMULATOR PLACEMENT Right 10/24/2013   ESOPHAGOGASTRODUODENOSCOPY (EGD) WITH PROPOFOL N/A 09/06/2018   Procedure: ESOPHAGOGASTRODUODENOSCOPY (EGD) WITH PROPOFOL;  Surgeon: Christena Deem, MD;  Location: [facility] ENDOSCOPY;  Service: Endoscopy;  Laterality: N/A;   LAPAROSCOPIC PARTIAL COLECTOMY     MELANOMA EXCISION     TONSILLECTOMY     age 40    Home Medications:  Allergies as of 02/06/2023       Reactions   Artane [trihexyphenidyl] Anxiety   Prochlorperazine Nausea And Vomiting   Propofol Other (See Comments)   Delirium Delirium Delirium Delirium   Reglan [metoclopramide]  To not be given pt has Parkinson's        Medication List        Accurate as of February 06, 2023  1:17 PM. If you have any questions, ask your nurse or doctor.          carbidopa-levodopa 25-100 MG tablet Commonly known as: SINEMET IR Take 2 tablets by mouth 4 (four) times daily.   donepezil 10 MG tablet Commonly known as: ARICEPT Take 10 mg by mouth at bedtime.   doxycycline 100 MG capsule Commonly known as: VIBRAMYCIN Take 1 capsule (100 mg total) by mouth every 12 (twelve) hours.   Elderberry Zinc/Vit C/Immune Lozg Use as directed 1 lozenge in the mouth or throat daily.   Gemtesa 75 MG Tabs Generic drug: Vibegron Take 1 tablet by mouth every evening.   melatonin 3 MG Tabs tablet Take 9 mg by mouth at bedtime.   multivitamin tablet Take 1 tablet by mouth daily.   pantoprazole 40 MG tablet Commonly known as: PROTONIX Take 40 mg by mouth 2 (two) times daily.   polyethylene glycol 17 g packet Commonly known as: MIRALAX / GLYCOLAX Take 17 g by mouth daily as needed for mild constipation or moderate constipation. What changed: when to take this   PROBIOTIC DIGESTIVE SUPPORT PO Take 1 tablet by mouth daily at 6 (six) AM.   QUEtiapine 25 MG tablet Commonly known as: SEROQUEL Take 75 mg  by mouth at bedtime.   sertraline 100 MG tablet Commonly known as: ZOLOFT Take 100 mg by mouth every morning.   tamsulosin 0.4 MG Caps capsule Commonly known as: FLOMAX Take 0.4 mg by mouth daily after supper.        Allergies:  Allergies  Allergen Reactions   Artane [Trihexyphenidyl] Anxiety   Prochlorperazine Nausea And Vomiting   Propofol Other (See Comments)    Delirium Delirium Delirium Delirium    Reglan [Metoclopramide]     To not be given pt has Parkinson's    Family History: Family History  Problem Relation Age of Onset   Diabetes Father     Social History:  reports that he has quit smoking. His smoking use included cigarettes. He has never used smokeless tobacco. He reports that he does not currently use alcohol after a past usage of about 6.0 standard drinks of alcohol per week. He reports that he does not use drugs.  ROS:                                        Physical Exam: BP 96/67   Pulse 63   Ht 5\' 9"  (1.753 m)   Wt 80.7 kg   BMI 26.29 kg/m   Constitutional:  Alert and oriented, No acute distress. HEENT: [place] AT, moist mucus membranes.  Trachea midline, no masses.   Laboratory Data: Lab Results  Component Value Date   WBC 9.8 05/02/2022   HGB 13.0 (A) 05/02/2022   HCT 38 (A) 05/02/2022   MCV 92.1 04/25/2022   PLT 266 05/02/2022    Lab Results  Component Value Date   CREATININE 1.2 05/02/2022    No results found for: "PSA"  No results found for: "TESTOSTERONE"  No results found for: "HGBA1C"  Urinalysis    Component Value Date/Time   COLORURINE STRAW (A) 04/22/2022 0805   APPEARANCEUR Clear 11/10/2022 1413   LABSPEC 1.004 (L) 04/22/2022 0805   PHURINE  6.0 04/22/2022 0805   GLUCOSEU Negative 11/10/2022 1413   HGBUR NEGATIVE 04/22/2022 0805   BILIRUBINUR Negative 11/10/2022 1413   KETONESUR NEGATIVE 04/22/2022 0805   PROTEINUR Negative 11/10/2022 1413   PROTEINUR NEGATIVE 04/22/2022 0805   NITRITE  Negative 11/10/2022 1413   NITRITE NEGATIVE 04/22/2022 0805   LEUKOCYTESUR 1+ (A) 11/10/2022 1413   LEUKOCYTESUR NEGATIVE 04/22/2022 0805    Pertinent Imaging: Urine reviewed and sent for culture  Assessment & Plan: Renew Gemtesa.  If he starts getting bladder infections put him back on trimethoprim.  Otherwise see in 6 months and check another residual.  1. Neurogenic bladder  - Urinalysis, Complete   No follow-ups on file.  Martina Sinner, MD  [facility] [address], [address] [place], [place] [address] [phone]

## 2023-02-11 LAB — CULTURE, URINE COMPREHENSIVE

## 2023-05-15 IMAGING — CR DG CHEST 2V
1 series · 2 of 2 positions shown · non-contrast
Comparison: 07/09/2020

CLINICAL DATA: Fever and cough

EXAM:
CHEST - 2 VIEW

[Series 1: dg chest 2 view · 0.14mm/px · 2 of 2 slices shown]
[im 1/2]
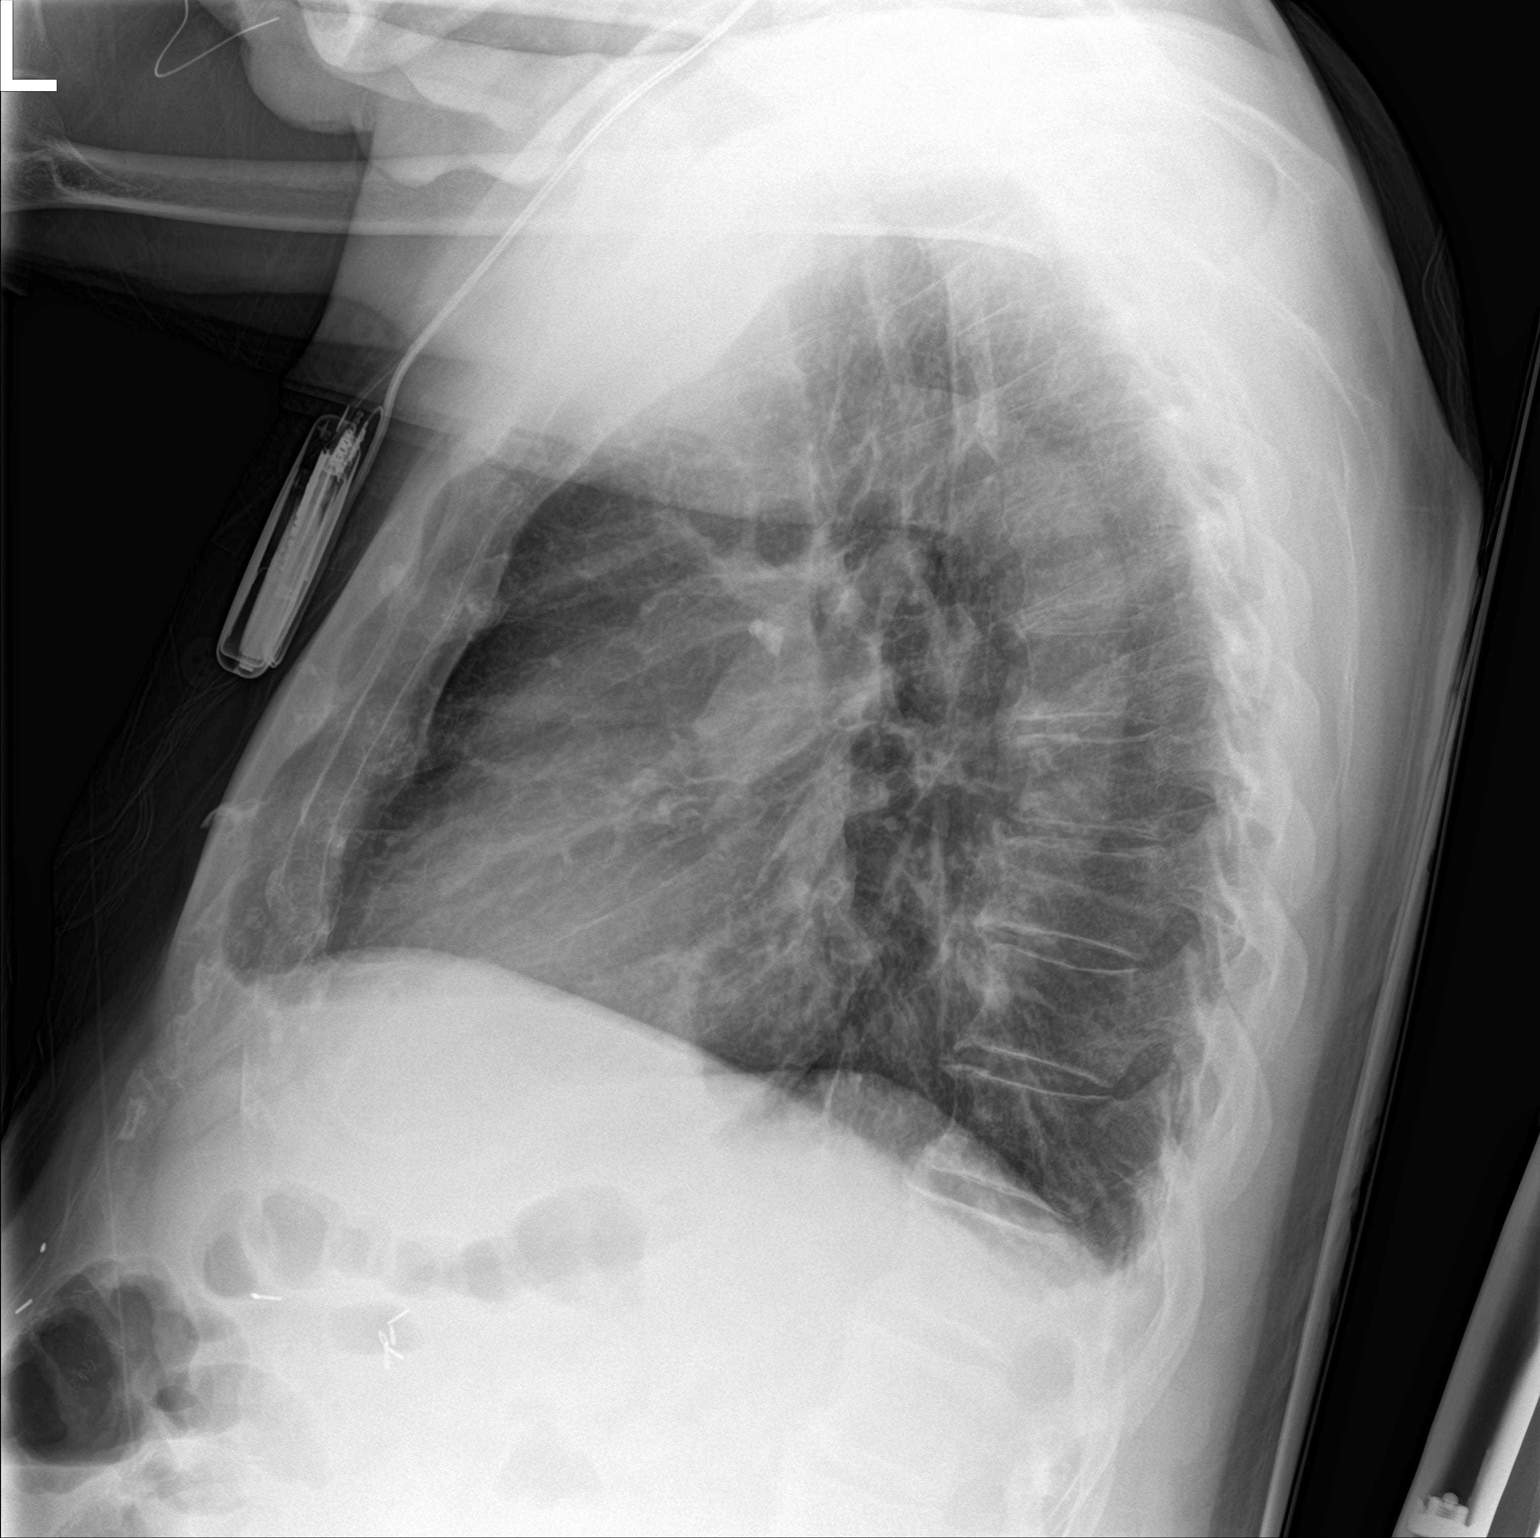
[im 2/2]
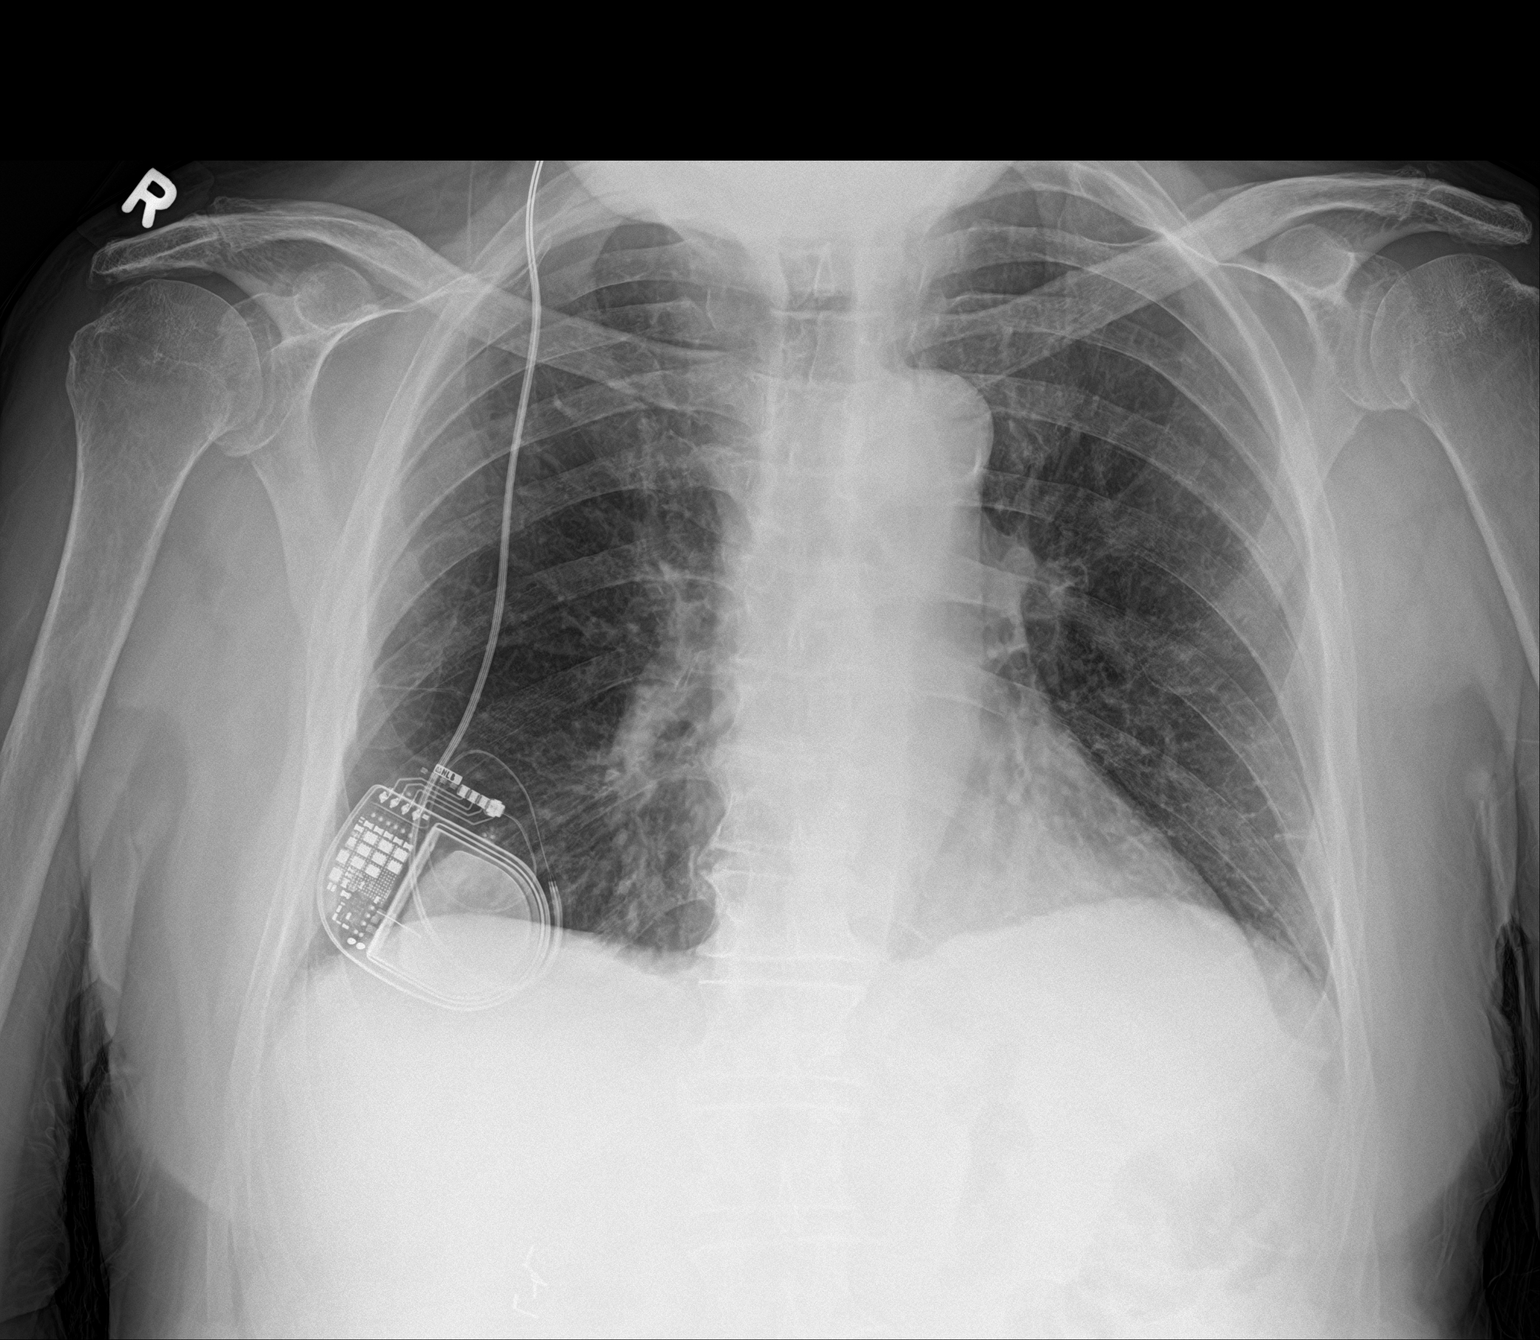

[2 of 2 positions shown; findings below may reference images not displayed]

FINDINGS: The heart size and mediastinal contours are within normal limits.
Both lungs are clear. The visualized skeletal structures are
unremarkable.

Electrical generator pack in the right chest with lead extending
into the neck. This is consistent with a deep brain stimulator as
noted on CT.
IMPRESSION: No active cardiopulmonary disease.

## 2023-05-19 ENCOUNTER — Telehealth: Payer: Self-pay

## 2023-05-19 NOTE — Telephone Encounter (Signed)
Pt's wife called triage line she states that pt is seeing blood in his urine, had negative UA with PCP and Dr. Graciela Husbands recommends being evaluated by Dr. Lonna Cobb. Pt scheduled. Pt advised on  ER precautions.

## 2023-05-22 ENCOUNTER — Encounter: Payer: Self-pay | Admitting: Urology

## 2023-05-22 ENCOUNTER — Ambulatory Visit: Payer: Medicare HMO | Admitting: Urology

## 2023-05-22 VITALS — BP 125/74 | HR 56 | Ht 69.0 in | Wt 178.0 lb

## 2023-05-22 DIAGNOSIS — N3941 Urge incontinence: Secondary | ICD-10-CM

## 2023-05-22 DIAGNOSIS — R310 Gross hematuria: Secondary | ICD-10-CM | POA: Diagnosis not present

## 2023-05-22 NOTE — Progress Notes (Signed)
I, Maysun Anabel Bene, acting as a scribe for Riki Altes, MD., have documented all relevant documentation on the behalf of Riki Altes, MD, as directed by Riki Altes, MD while in the presence of Riki Altes, MD.  05/22/2023 2:54 PM   Wesley Anderson Jul 10, 1946 [id number]  Referring provider: Lynnea Ferrier, MD [address] [facility] [place],  [place] [address]  Chief Complaint  Patient presents with   Follow-up    HPI: Wesley Kelter. is a 77 y.o. male presents for evaluation of recurrent and gross hematuria.   History of bladder calculus, status post-cystolethalopaxi February 2024.  He is followed by Dr. Sherron Monday  for a neurogenic bladder and underwent cystoscopy 02/06/23. There was sub optimal visualization however he felt there was most likely a recurrent bladder stone present  UA performed last week at [facility] clinic showed 37 RBCs    PMH: Past Medical History:  Diagnosis Date   Alcohol dependence in remission (HCC)    Anemia    Aortic atherosclerosis (HCC)    Aortic ectasia, thoracoabdominal (HCC)    a.) [place] retroperitoneal 05/31/2016: abd Ao measured 2.7 cm; b.) CT chest 11/16/2020: asc Ao measured 4.0 cm; c.)  CT chest 01/22/2021: asc Ao measured 4.0 cm; d.) CT chest 03/26/2021: asc Ao measured 4.0 cm   Bladder stones    BPH (benign prostatic hyperplasia)    Complication of anesthesia    a.) (+) postoperative delirium   Constipation    Coronary artery calcification seen on CT scan    Depression    ED (erectile dysfunction)    Fall at home    GERD (gastroesophageal reflux disease)    Hematest positive stools    HFrEF (heart failure with reduced ejection fraction) (HCC)    a.) TTE 04/13/2022: EF 40%, glob HK, mild panvalvular regurg.   History of 2019 novel coronavirus disease (COVID-19) 04/22/2022   History of kidney stones    History of postoperative delirium 07/03/2020   a.) in the setting of GA for skin cancer  removal; combativeness + acute worsening AMS + worsening memory/overall cognition persistent for several days following procedure   Incontinence of urine    Left inguinal hernia    Melanoma of skin (HCC)    Nephrolithiasis    Parkinson's disease (HCC)    a.) RIGHT STN-DBS in place   Parkinson's disease dementia (HCC)    a.) on donepezil   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) (HCC)    Renal cyst, right    Restless leg syndrome    Sepsis (HCC)    Sinus bradycardia    Sleep difficulties    a.) takes melatonin   Squamous cell carcinoma of scalp    a.) s/p flap excision at [facility] 07/03/2020 under general anesthesia; procedure complicated by (+) postoperative delirium   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   a.) s/p RIGHT STN-DBS placement   Tubular adenoma of colon    Umbilical hernia    UTI (urinary tract infection)    Vocal cord polyp    a.) s/p removal in 1973    Surgical History: Past Surgical History:  Procedure Laterality Date   CHOLECYSTECTOMY     COLONOSCOPY WITH PROPOFOL N/A 11/07/2016   Procedure: COLONOSCOPY WITH PROPOFOL;  Surgeon: Christena Deem, MD;  Location: [facility] ENDOSCOPY;  Service: Endoscopy;  Laterality: N/A;   COLONOSCOPY WITH PROPOFOL N/A 09/06/2018   Procedure: COLONOSCOPY WITH PROPOFOL;  Surgeon: Christena Deem, MD;  Location: [facility] ENDOSCOPY;  Service: Endoscopy;  Laterality: N/A;   COLONOSCOPY WITH PROPOFOL N/A 05/16/2019   Procedure: COLONOSCOPY WITH PROPOFOL;  Surgeon: Christena Deem, MD;  Location: [facility] ENDOSCOPY;  Service: Endoscopy;  Laterality: N/A;   CYST EXCISION     vocal cord   CYSTOSCOPY WITH LITHOLAPAXY N/A 10/11/2022   Procedure: CYSTOSCOPY WITH LITHOLAPAXY;  Surgeon: Riki Altes, MD;  Location: [facility] ORS;  Service: Urology;  Laterality: N/A;   DEEP BRAIN STIMULATOR PLACEMENT Right 10/24/2013   ESOPHAGOGASTRODUODENOSCOPY (EGD) WITH PROPOFOL N/A 09/06/2018   Procedure:  ESOPHAGOGASTRODUODENOSCOPY (EGD) WITH PROPOFOL;  Surgeon: Christena Deem, MD;  Location: [facility] ENDOSCOPY;  Service: Endoscopy;  Laterality: N/A;   LAPAROSCOPIC PARTIAL COLECTOMY     MELANOMA EXCISION     TONSILLECTOMY     age 68    Home Medications:  Allergies as of 05/22/2023       Reactions   Artane [trihexyphenidyl] Anxiety   Prochlorperazine Nausea And Vomiting   Propofol Other (See Comments)   Delirium Delirium Delirium Delirium   Reglan [metoclopramide]    To not be given pt has Parkinson's        Medication List        Accurate as of May 22, 2023  2:54 PM. If you have any questions, ask your nurse or doctor.          carbidopa-levodopa 25-100 MG tablet Commonly known as: SINEMET IR Take 2 tablets by mouth 4 (four) times daily.   donepezil 10 MG tablet Commonly known as: ARICEPT Take 10 mg by mouth at bedtime.   doxycycline 100 MG capsule Commonly known as: VIBRAMYCIN Take 1 capsule (100 mg total) by mouth every 12 (twelve) hours.   Elderberry Zinc/Vit C/Immune Lozg Use as directed 1 lozenge in the mouth or throat daily.   Gemtesa 75 MG Tabs Generic drug: Vibegron Take 1 tablet (75 mg total) by mouth every evening.   melatonin 3 MG Tabs tablet Take 9 mg by mouth at bedtime.   multivitamin tablet Take 1 tablet by mouth daily.   pantoprazole 40 MG tablet Commonly known as: PROTONIX Take 40 mg by mouth 2 (two) times daily.   polyethylene glycol 17 g packet Commonly known as: MIRALAX / GLYCOLAX Take 17 g by mouth daily as needed for mild constipation or moderate constipation. What changed: when to take this   PROBIOTIC DIGESTIVE SUPPORT PO Take 1 tablet by mouth daily at 6 (six) AM.   QUEtiapine 25 MG tablet Commonly known as: SEROQUEL Take 75 mg by mouth at bedtime.   sertraline 100 MG tablet Commonly known as: ZOLOFT Take 100 mg by mouth every morning.   tamsulosin 0.4 MG Caps capsule Commonly known as: FLOMAX Take 0.4 mg by  mouth daily after supper.        Allergies:  Allergies  Allergen Reactions   Artane [Trihexyphenidyl] Anxiety   Prochlorperazine Nausea And Vomiting   Propofol Other (See Comments)    Delirium Delirium Delirium Delirium    Reglan [Metoclopramide]     To not be given pt has Parkinson's    Family History: Family History  Problem Relation Age of Onset   Diabetes Father     Social History:  reports that he has quit smoking. His smoking use included cigarettes. He has never used smokeless tobacco. He reports that he does not currently use alcohol after a past usage of about 6.0 standard drinks of alcohol per week. He reports  that he does not use drugs.   Physical Exam: BP 125/74   Pulse (!) 56   Ht 5\' 9"  (1.753 m)   Wt 178 lb (80.7 kg)   BMI 26.29 kg/m   Constitutional:  Alert and oriented, No acute distress. HEENT: [place] AT Respiratory: Normal respiratory effort, no increased work of breathing.   Pertinent Imaging: Pending  Laboratory: Urinalysis microscopy 3-10 RBC/11-30 WBC   Assessment & Plan:    1. Gross hematuria Possible bladder calculus on a recent cystoscopy.  Schedule CT hematuria study and will call with the results.   I have reviewed the above documentation for accuracy and completeness, and I agree with the above.   Riki Altes, MD  [facility] [address], [address] [place], [place] [address] [phone]

## 2023-05-23 LAB — URINALYSIS, COMPLETE
Bilirubin, UA: NEGATIVE
Glucose, UA: NEGATIVE
Ketones, UA: NEGATIVE
Nitrite, UA: NEGATIVE
Protein,UA: NEGATIVE
Specific Gravity, UA: >1.03 — ABNORMAL HIGH (ref 1.005–1.030)
Urobilinogen, Ur: 0.2 mg/dL (ref 0.2–1.0)
pH, UA: 5.5 (ref 5.0–7.5)

## 2023-05-23 LAB — MICROSCOPIC EXAMINATION

## 2023-05-24 ENCOUNTER — Encounter: Payer: Self-pay | Admitting: Urology

## 2023-05-25 LAB — CULTURE, URINE COMPREHENSIVE

## 2023-06-12 ENCOUNTER — Ambulatory Visit
Admission: RE | Admit: 2023-06-12 | Discharge: 2023-06-12 | Disposition: A | Discharge status: Home / Self Care | Payer: Medicare HMO | Source: Ambulatory Visit | Attending: Urology | Admitting: Urology

## 2023-06-12 DIAGNOSIS — R310 Gross hematuria: Secondary | ICD-10-CM | POA: Diagnosis present

## 2023-06-12 MED ORDER — IOHEXOL 300 MG/ML  SOLN
100.0000 mL | Freq: Once | INTRAMUSCULAR | Status: AC | PRN
  Administered 2023-06-12: 100 mL via INTRAVENOUS

## 2023-06-14 IMAGING — DX DG CHEST 1V PORT
1 series · 1 of 1 positions shown · non-contrast
Comparison: 07/09/2021

CLINICAL DATA: AMS

EXAM:
PORTABLE CHEST 1 VIEW

[chest ap]
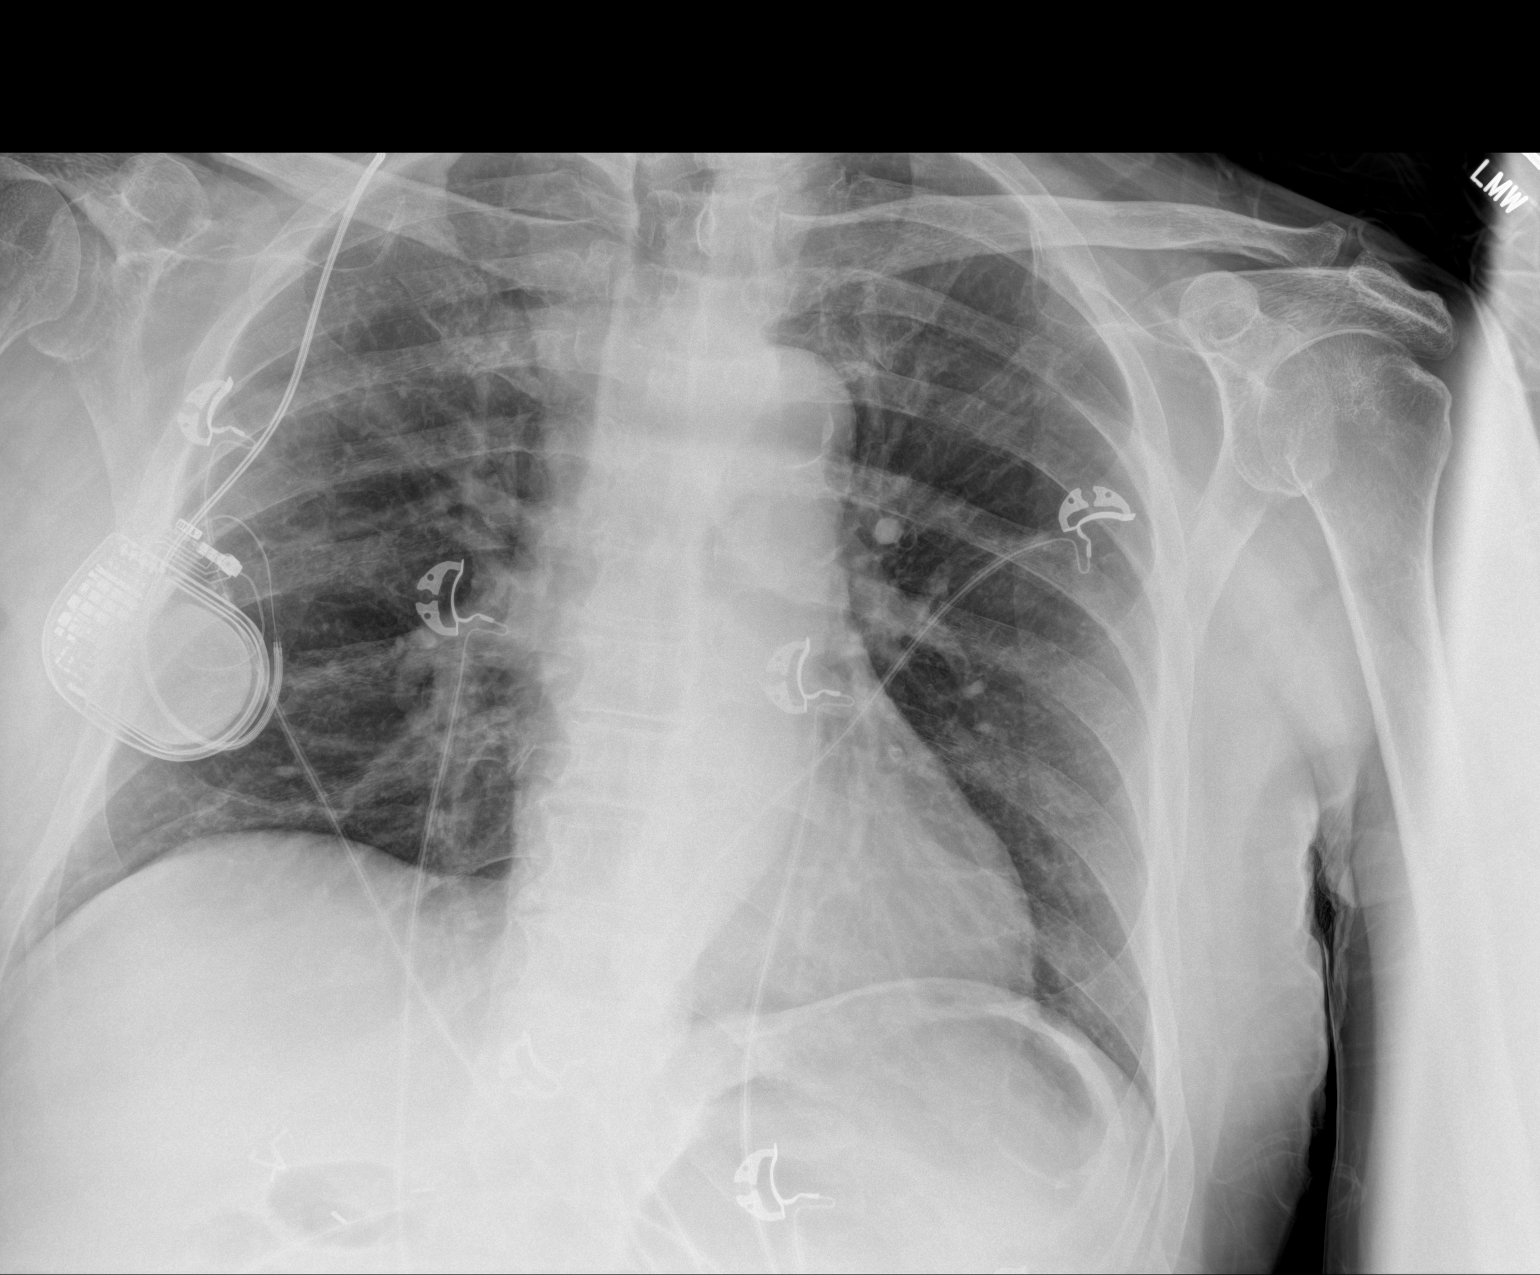

[1 of 1 positions shown; findings below may reference images not displayed]

FINDINGS: The heart size and mediastinal contours are within normal limits.
Both lungs are clear. The visualized skeletal structures are
unremarkable. Deep brain stimulator generator implanted in the right
chest.
IMPRESSION: No active disease.

## 2023-06-14 IMAGING — CT CT HEAD W/O CM
4 series · 16 of 47 positions shown, 18 images · non-contrast
Comparison: None.

CLINICAL DATA: Encephalopathy

EXAM:
CT HEAD WITHOUT CONTRAST
TECHNIQUE: Contiguous axial images were obtained from the base of the skull
through the vertex without intravenous contrast.

[Series 2: head wo · axial · 0.45mm/px · z∈[-63,+57]mm · 7 of 33 slices shown, 9 images]
[im 5/33  brain]
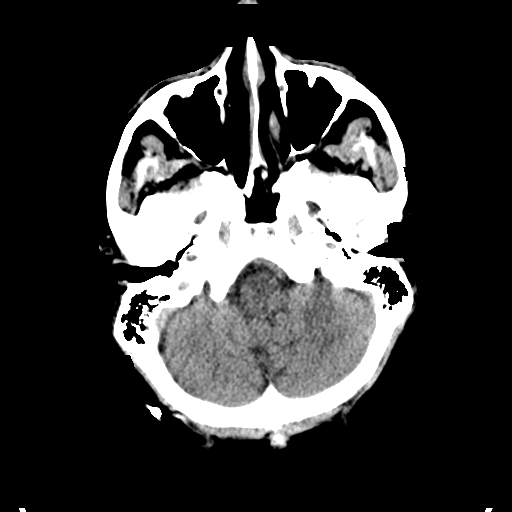
[im 5/33  bone]
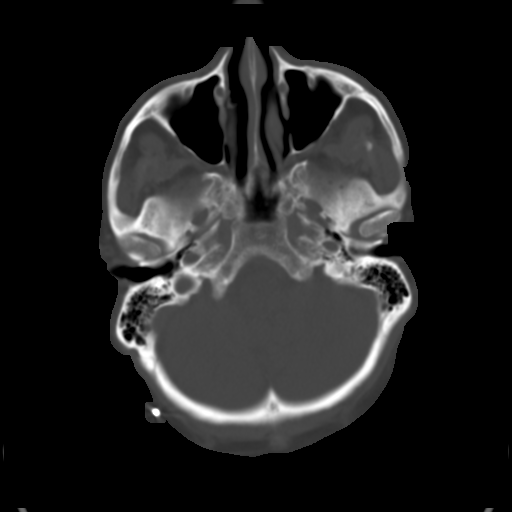
[im 9/33  brain]
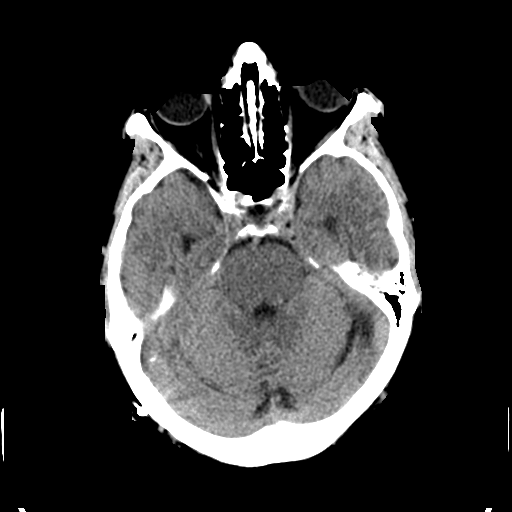
[im 13/33  brain]
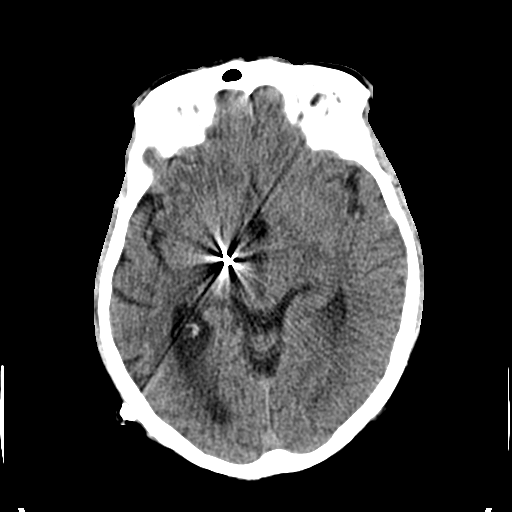
[im 17/33  brain]
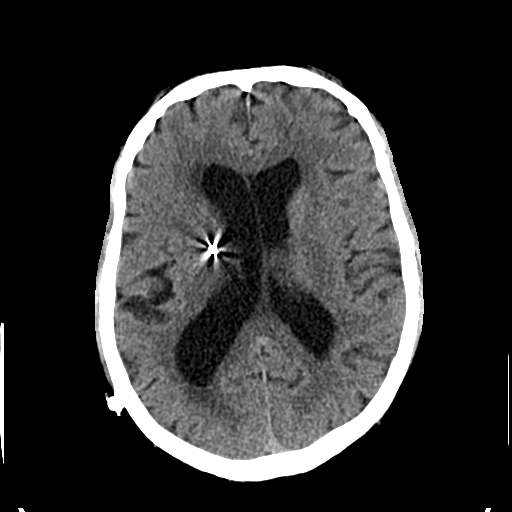
[im 21/33  brain]
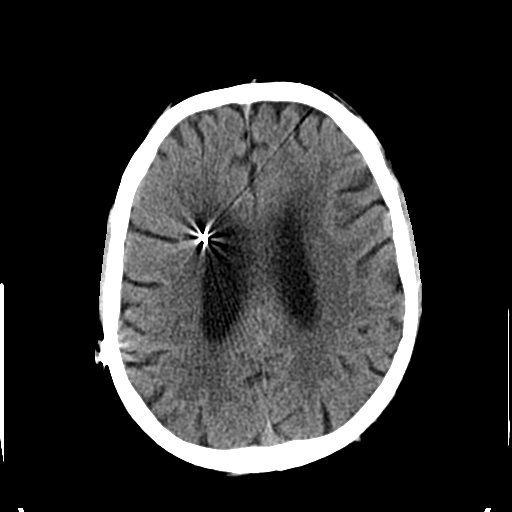
[im 21/33  bone]
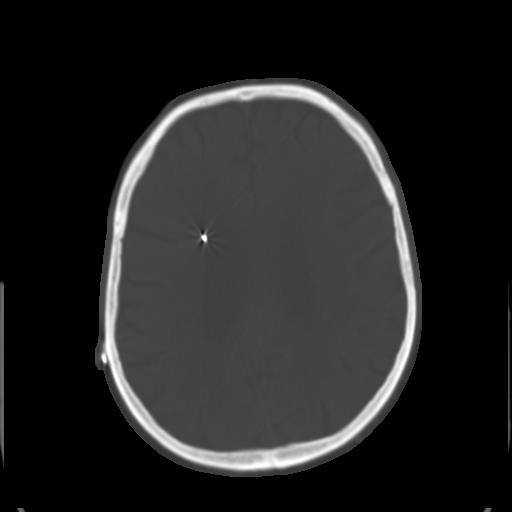
[im 25/33  brain]
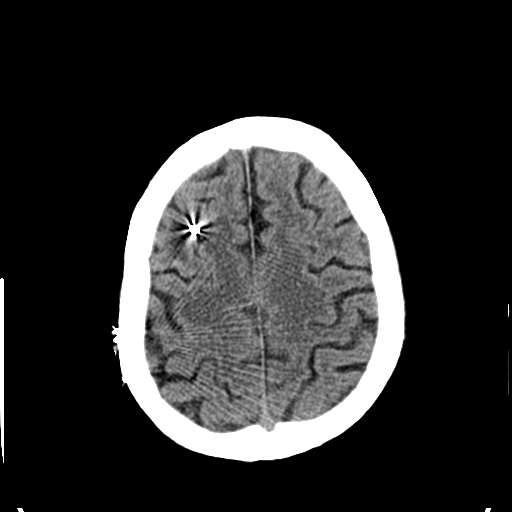
[im 29/33  brain]
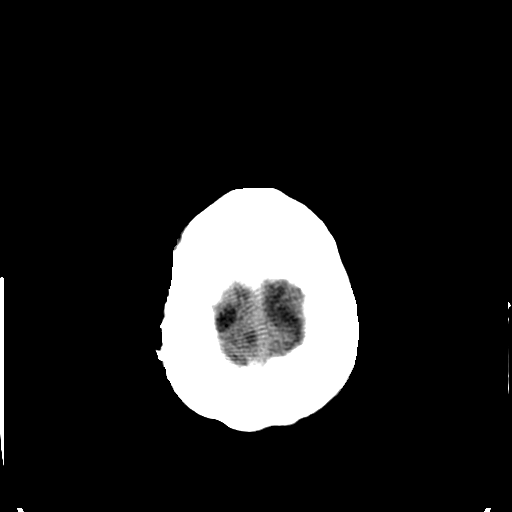

[Series 3: head bone · axial · 0.45mm/px · z∈[-69,-37]mm · 3 of 78 slices shown]
[im 8/78  bone]
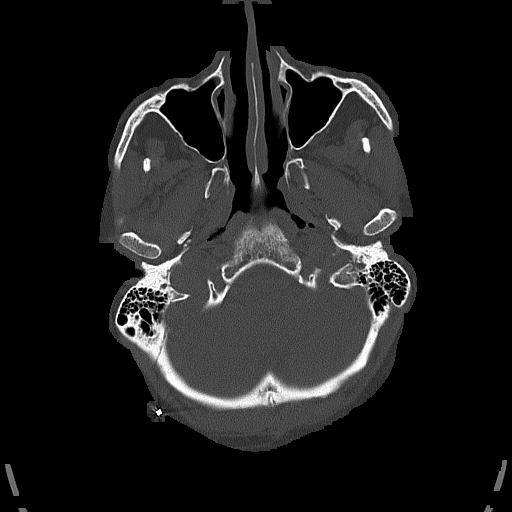
[im 16/78  bone]
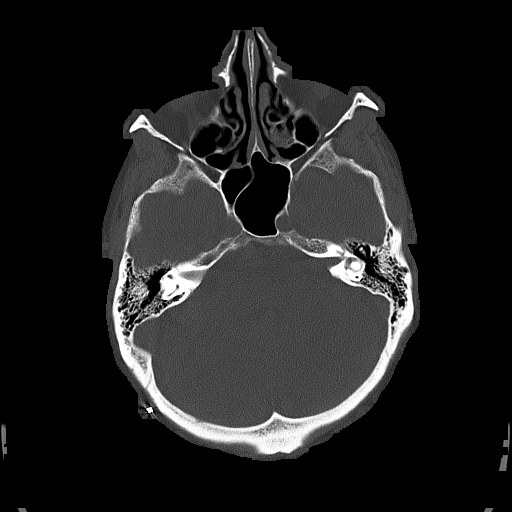
[im 24/78  bone]
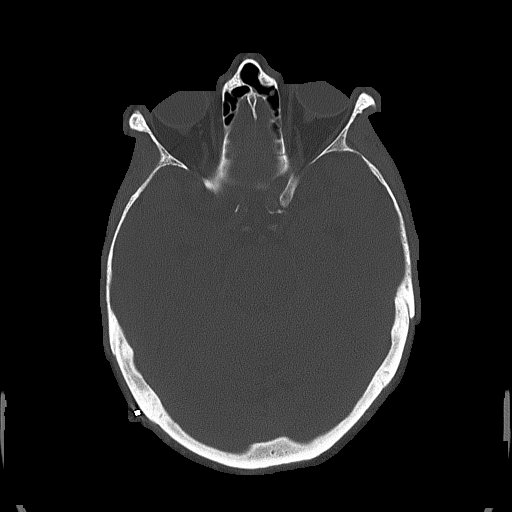

[Series 4: coronal soft tissue · coronal · 0.30mm/px · 3 of 68 slices shown]
[im 23/68  brain]
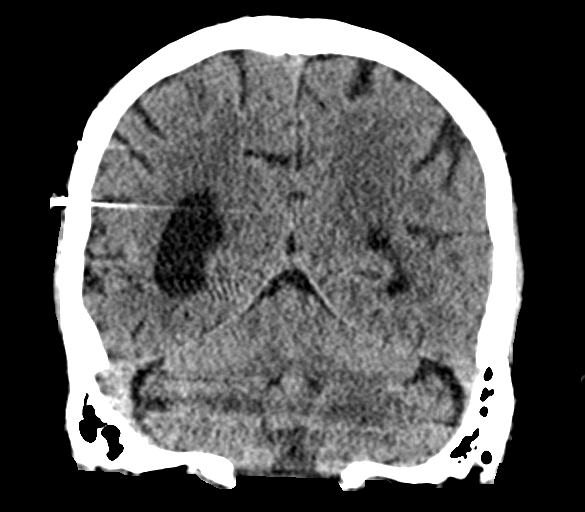
[im 30/68  brain]
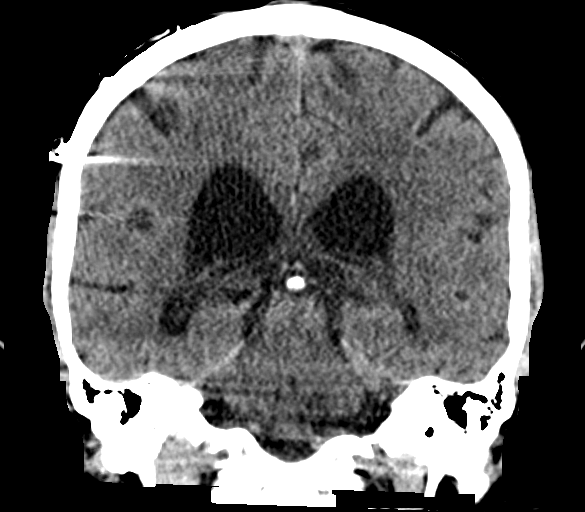
[im 38/68  brain]
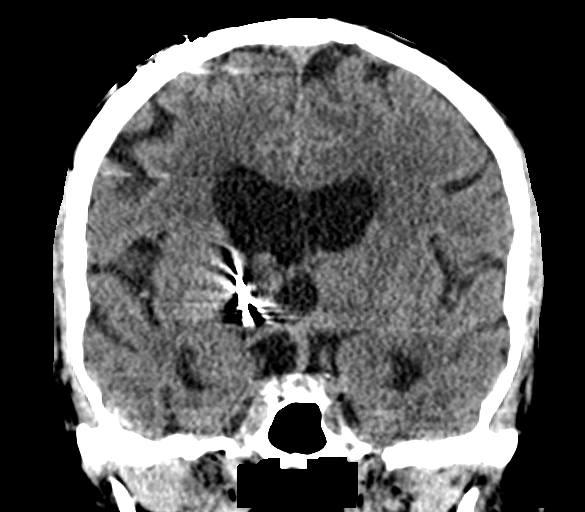

[Series 5: sagittal soft tissue · sagittal · 0.30mm/px · 3 of 57 slices shown]
[im 19/57  brain]
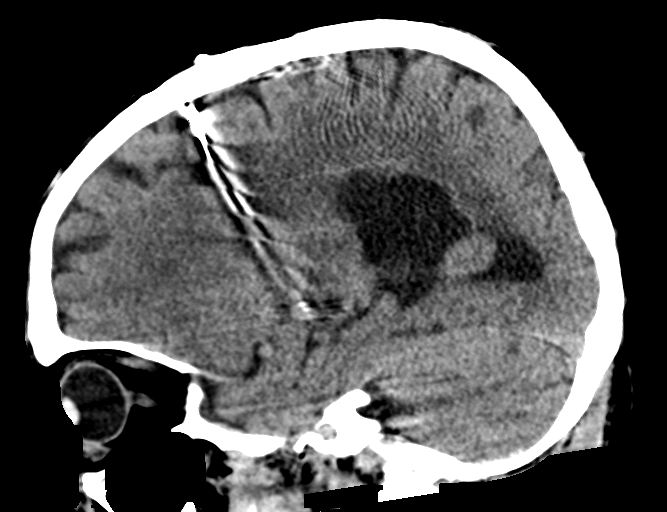
[im 29/57  brain]
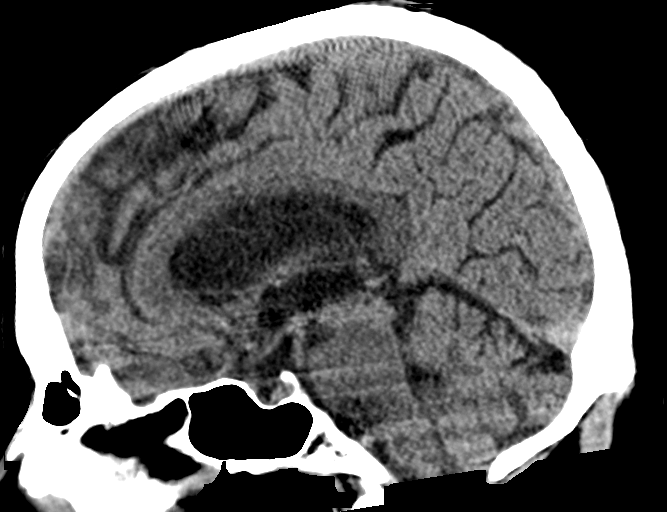
[im 38/57  brain]
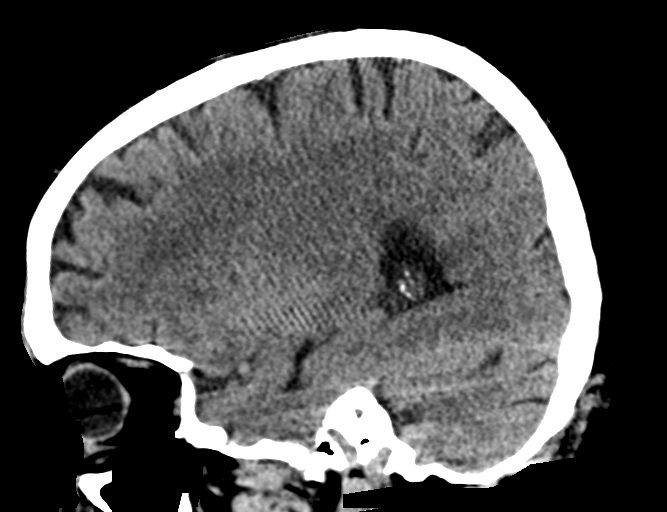

[16 of 47 positions shown; findings below may reference images not displayed]

FINDINGS: Brain: There is no mass, hemorrhage or extra-axial collection. The
appearance of the white matter is normal for the patient's age.
There is generalized atrophy. The brain stimulator lead terminates
in the region of the right subthalamic nucleus.

Vascular: No abnormal hyperdensity of the major intracranial
arteries or dural venous sinuses. No intracranial atherosclerosis.

Skull: The visualized skull base, calvarium and extracranial soft
tissues are normal.

Sinuses/Orbits: No fluid levels or advanced mucosal thickening of
the visualized paranasal sinuses. No mastoid or middle ear effusion.
The orbits are normal.
IMPRESSION: 1. No acute intracranial abnormality.
2.  Cerebral Atrophy ([id number]-F27.Y).

## 2023-07-31 ENCOUNTER — Ambulatory Visit: Payer: Medicare HMO | Admitting: Urology

## 2023-08-14 ENCOUNTER — Ambulatory Visit: Payer: Medicare HMO | Admitting: Urology

## 2023-08-14 VITALS — BP 120/71 | HR 62

## 2023-08-14 DIAGNOSIS — N3941 Urge incontinence: Secondary | ICD-10-CM

## 2023-08-14 LAB — BLADDER SCAN AMB NON-IMAGING: Scan Result: 88

## 2023-08-14 MED ORDER — GEMTESA 75 MG PO TABS: 1.0000 | ORAL_TABLET | Freq: Every evening | ORAL | 11 refills | Status: DC

## 2023-08-14 MED ORDER — CIPROFLOXACIN HCL 250 MG PO TABS: 250.0000 mg | ORAL_TABLET | Freq: Two times a day (BID) | ORAL | 0 refills | Status: AC

## 2023-08-14 NOTE — Progress Notes (Signed)
08/14/2023 1:18 PM   Flossie Dibble 1946/04/19 [id number]  Referring provider: Lynnea Ferrier, MD [address] [facility] [place],  [place] [address]  Chief Complaint  Patient presents with   Follow-up    HPI: Patient has high-volume urge incontinence and high-volume bedwetting.  He actually does not get up to urinate and uses pads.  He can wear 6 pads a day moderately wet or soaked.  Last visit he was given Myrbetriq.  Leslye Peer had initially worked and then stopped working.  He had been on Flomax   He voids every 1-2 hours and has no nocturia.   He has had Parkinson's for many years.  His wife thinks he gets multiple bladder infections with dysuria and just finished an antibiotic on Friday.  The last 2 cultures have been negative but he has had positive cultures.   He tends toward significant constipation   Patient has urge incontinence and neurogenic bladder.  I am not convinced that urodynamics would change the management.  He had red blood cells in the urine today but it looked infected.  Recognizing potential limitations I decided to give him ciprofloxacin 500 mg twice a day for 7 days and put him on trimethoprim 100 mg daily 30x11.  Have him come back and see if he down regulate in about 6 weeks for cystoscopy.  Antimuscarinics may not be ideal due to Parkinson's.  Not a good candidate for InterStim or Botox but good candidate for percutaneous tibial nerve stimulation.     The Leslye Peer was working for short while and perhaps he failed because of infections.  Patient had a normal renal ultrasound in October 2020   Last culture positive sensitive to ciprofloxacin and Macrodantin.  He still has some burning.  Urine looked positive.  He still leaking a lot on Gemtesa and on trimethoprim   Cystoscopy: Patient underwent flexible cystoscopy.  Penile bulbar urethra normal.  He had bilobar enlargement of the prostate.  He had a little bit of elevated bladder neck.   Urine was very cloudy with white flecks and sediment.  It was difficult to see the urothelium throughout the bladder but it looked erythematous.  He appeared to have at least a 2 cm stone in the midline and visibility was modest at best     [facility] on last culture I will call in ciprofloxacin 250 mg twice a day for 7 days and put him on daily Macrodantin 100 mg. I will get CT stone protocol and he will follow-up with Dr. Lonna Cobb and likely will need a transurethral removal of bladder stone. If we can sterilize his urine he may do well with the medication such as Gemtesa. He will need bladder stone removed to sterilize his urine. He will need a postvoid residual urine volume in the future. His residual May 25, 2022 was 106 mL. I think is very reasonable to go back and try Gemtesa after he sterilized    Today Patient had transurethral removal of bladder stone on October 11, 2022.  It was approximately 3 cm in size also seen on CT scan.  He reported he was doing better in terms of his urgency on Gemtesa when he saw Dr. Kathie Rhodes postoperatively.  No infections and is not on trimethoprim.  He has almost complete control and is very pleased flow stable    Renew Gemtesa. If he starts getting bladder infections put him back on trimethoprim. Otherwise see in 6 months and check another  residual.  I last saw the patient June 2024.  Today I last saw the patient 6 months ago.  I think there may have been some confusion that I had formed cystoscopy in June but had not.  The patient went on to see Dr. Lonna Cobb and had a CT scan.  There were 2 small residual stones in the bladder.  One of them was 6 mm in size in the posterior right lateral bladder diverticulum.  He had a 2 mm stone in the posterior left lateral bladder diverticulum.  Urine culture normal October 2024 when seen by Dr. Lonna Cobb.  He also has a 7 mm nonobstructing stone lower pole right kidney and a Bosniak 1 cyst.  Being well from a bladder control standpoint.   He saw a little bit of spotting off and on for a few days without any pain.  Urine reviewed and sent for culture.    PMH: Past Medical History:  Diagnosis Date   Alcohol dependence in remission (HCC)    Anemia    Aortic atherosclerosis (HCC)    Aortic ectasia, thoracoabdominal (HCC)    a.) [place] retroperitoneal 05/31/2016: abd Ao measured 2.7 cm; b.) CT chest 11/16/2020: asc Ao measured 4.0 cm; c.)  CT chest 01/22/2021: asc Ao measured 4.0 cm; d.) CT chest 03/26/2021: asc Ao measured 4.0 cm   Bladder stones    BPH (benign prostatic hyperplasia)    Complication of anesthesia    a.) (+) postoperative delirium   Constipation    Coronary artery calcification seen on CT scan    Depression    ED (erectile dysfunction)    Fall at home    GERD (gastroesophageal reflux disease)    Hematest positive stools    HFrEF (heart failure with reduced ejection fraction) (HCC)    a.) TTE 04/13/2022: EF 40%, glob HK, mild panvalvular regurg.   History of 2019 novel coronavirus disease (COVID-19) 04/22/2022   History of kidney stones    History of postoperative delirium 07/03/2020   a.) in the setting of GA for skin cancer removal; combativeness + acute worsening AMS + worsening memory/overall cognition persistent for several days following procedure   Incontinence of urine    Left inguinal hernia    Melanoma of skin (HCC)    Nephrolithiasis    Parkinson's disease (HCC)    a.) RIGHT STN-DBS in place   Parkinson's disease dementia (HCC)    a.) on donepezil   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) (HCC)    Renal cyst, right    Restless leg syndrome    Sepsis (HCC)    Sinus bradycardia    Sleep difficulties    a.) takes melatonin   Squamous cell carcinoma of scalp    a.) s/p flap excision at [facility] 07/03/2020 under general anesthesia; procedure complicated by (+) postoperative delirium   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   a.) s/p RIGHT STN-DBS placement    Tubular adenoma of colon    Umbilical hernia    UTI (urinary tract infection)    Vocal cord polyp    a.) s/p removal in 1973    Surgical History: Past Surgical History:  Procedure Laterality Date   CHOLECYSTECTOMY     COLONOSCOPY WITH PROPOFOL N/A 11/07/2016   Procedure: COLONOSCOPY WITH PROPOFOL;  Surgeon: Christena Deem, MD;  Location[facility] ENDOSCOPY;  Service: Endoscopy;  Laterality: N/A;   COLONOSCOPY WITH PROPOFOL N/A 09/06/2018   Procedure: COLONOSCOPY WITH PROPOFOL;  Surgeon: Christena Deem,  MD;  Location: [facility] ENDOSCOPY;  Service: Endoscopy;  Laterality: N/A;   COLONOSCOPY WITH PROPOFOL N/A 05/16/2019   Procedure: COLONOSCOPY WITH PROPOFOL;  Surgeon: Christena Deem, MD;  Location: [facility] ENDOSCOPY;  Service: Endoscopy;  Laterality: N/A;   CYST EXCISION     vocal cord   CYSTOSCOPY WITH LITHOLAPAXY N/A 10/11/2022   Procedure: CYSTOSCOPY WITH LITHOLAPAXY;  Surgeon: Riki Altes, MD;  Location: [facility] ORS;  Service: Urology;  Laterality: N/A;   DEEP BRAIN STIMULATOR PLACEMENT Right 10/24/2013   ESOPHAGOGASTRODUODENOSCOPY (EGD) WITH PROPOFOL N/A 09/06/2018   Procedure: ESOPHAGOGASTRODUODENOSCOPY (EGD) WITH PROPOFOL;  Surgeon: Christena Deem, MD;  Location: [facility] ENDOSCOPY;  Service: Endoscopy;  Laterality: N/A;   LAPAROSCOPIC PARTIAL COLECTOMY     MELANOMA EXCISION     TONSILLECTOMY     age 4    Home Medications:  Allergies as of 08/14/2023       Reactions   Artane [trihexyphenidyl] Anxiety   Prochlorperazine Nausea And Vomiting   Propofol Other (See Comments)   Delirium Delirium Delirium Delirium   Reglan [metoclopramide]    To not be given pt has Parkinson's        Medication List        Accurate as of August 14, 2023  1:18 PM. If you have any questions, ask your nurse or doctor.          carbidopa-levodopa 25-100 MG tablet Commonly known as: SINEMET IR Take 2 tablets by mouth 4 (four) times daily.   donepezil 10 MG tablet Commonly  known as: ARICEPT Take 10 mg by mouth at bedtime.   doxycycline 100 MG capsule Commonly known as: VIBRAMYCIN Take 1 capsule (100 mg total) by mouth every 12 (twelve) hours.   Elderberry Zinc/Vit C/Immune Lozg Use as directed 1 lozenge in the mouth or throat daily.   Gemtesa 75 MG Tabs Generic drug: Vibegron Take 1 tablet (75 mg total) by mouth every evening.   melatonin 3 MG Tabs tablet Take 9 mg by mouth at bedtime.   multivitamin tablet Take 1 tablet by mouth daily.   pantoprazole 40 MG tablet Commonly known as: PROTONIX Take 40 mg by mouth 2 (two) times daily.   polyethylene glycol 17 g packet Commonly known as: MIRALAX / GLYCOLAX Take 17 g by mouth daily as needed for mild constipation or moderate constipation. What changed: when to take this   PROBIOTIC DIGESTIVE SUPPORT PO Take 1 tablet by mouth daily at 6 (six) AM.   QUEtiapine 25 MG tablet Commonly known as: SEROQUEL Take 75 mg by mouth at bedtime.   sertraline 100 MG tablet Commonly known as: ZOLOFT Take 100 mg by mouth every morning.   tamsulosin 0.4 MG Caps capsule Commonly known as: FLOMAX Take 0.4 mg by mouth daily after supper.        Allergies:  Allergies  Allergen Reactions   Artane [Trihexyphenidyl] Anxiety   Prochlorperazine Nausea And Vomiting   Propofol Other (See Comments)    Delirium Delirium Delirium Delirium    Reglan [Metoclopramide]     To not be given pt has Parkinson's    Family History: Family History  Problem Relation Age of Onset   Diabetes Father     Social History:  reports that he has quit smoking. His smoking use included cigarettes. He has never used smokeless tobacco. He reports that he does not currently use alcohol after a past usage of about 6.0 standard drinks of alcohol per week. He reports that he does not use  drugs.  ROS:                                        Physical Exam: There were no vitals taken for this visit.   Constitutional:  Alert and oriented, No acute distress. HEENT: [place] AT, moist mucus membranes.  Trachea midline, no masses.   Laboratory Data: Lab Results  Component Value Date   WBC 9.8 05/02/2022   HGB 13.0 (A) 05/02/2022   HCT 38 (A) 05/02/2022   MCV 92.1 04/25/2022   PLT 266 05/02/2022    Lab Results  Component Value Date   CREATININE 1.2 05/02/2022    No results found for: "PSA"  No results found for: "TESTOSTERONE"  No results found for: "HGBA1C"  Urinalysis    Component Value Date/Time   COLORURINE STRAW (A) 04/22/2022 0805   APPEARANCEUR Hazy (A) 05/22/2023 1438   LABSPEC 1.004 (L) 04/22/2022 0805   PHURINE 6.0 04/22/2022 0805   GLUCOSEU Negative 05/22/2023 1438   HGBUR NEGATIVE 04/22/2022 0805   BILIRUBINUR Negative 05/22/2023 1438   KETONESUR NEGATIVE 04/22/2022 0805   PROTEINUR Negative 05/22/2023 1438   PROTEINUR NEGATIVE 04/22/2022 0805   NITRITE Negative 05/22/2023 1438   NITRITE NEGATIVE 04/22/2022 0805   LEUKOCYTESUR 1+ (A) 05/22/2023 1438   LEUKOCYTESUR NEGATIVE 04/22/2022 0805    Pertinent Imaging:   Assessment & Plan: From a hematuria standpoint patient has been cleared as he is just had a CT scan and to cystoscopic procedures 1 in the office 1 in the operating room.  I described the small stones he has in kidney and bladder.  Watchful waiting for them.  Reassurance given.  Call if culture positive.  Gemtesa renewed and see in 1 year with postvoid residual.  Postvoid residual today was 88 mL.  He may need radiographic follow-up for enlarging stones over time.  1. Urgency incontinence (Primary)  - Urinalysis, Complete - BLADDER SCAN AMB NON-IMAGING   No follow-ups on file.  Martina Sinner, MD  [facility] [address], [address] [place], [place] [address] [phone]

## 2023-08-15 LAB — URINALYSIS, COMPLETE
Bilirubin, UA: NEGATIVE
Glucose, UA: NEGATIVE
Nitrite, UA: NEGATIVE
RBC, UA: NEGATIVE
Specific Gravity, UA: >1.03 — ABNORMAL HIGH (ref 1.005–1.030)
Urobilinogen, Ur: 0.2 mg/dL (ref 0.2–1.0)
pH, UA: 5.5 (ref 5.0–7.5)

## 2023-08-15 LAB — MICROSCOPIC EXAMINATION

## 2023-10-09 ENCOUNTER — Ambulatory Visit: Payer: Medicare HMO | Admitting: Physician Assistant

## 2023-10-09 VITALS — BP 105/66 | HR 76 | Ht 69.0 in | Wt 178.0 lb

## 2023-10-09 DIAGNOSIS — R82998 Other abnormal findings in urine: Secondary | ICD-10-CM | POA: Diagnosis not present

## 2023-10-09 DIAGNOSIS — N390 Urinary tract infection, site not specified: Secondary | ICD-10-CM

## 2023-10-09 DIAGNOSIS — Z87440 Personal history of urinary (tract) infections: Secondary | ICD-10-CM

## 2023-10-09 LAB — URINALYSIS, COMPLETE
Bilirubin, UA: NEGATIVE
Glucose, UA: NEGATIVE
Nitrite, UA: NEGATIVE
RBC, UA: NEGATIVE
Specific Gravity, UA: >1.03 — ABNORMAL HIGH (ref 1.005–1.030)
Urobilinogen, Ur: 0.2 mg/dL (ref 0.2–1.0)
pH, UA: 6 (ref 5.0–7.5)

## 2023-10-09 LAB — MICROSCOPIC EXAMINATION

## 2023-10-09 MED ORDER — NITROFURANTOIN MONOHYD MACRO 100 MG PO CAPS: 100.0000 mg | ORAL_CAPSULE | Freq: Two times a day (BID) | ORAL | 0 refills | Status: AC

## 2023-10-09 NOTE — Progress Notes (Signed)
 10/09/2023 4:36 PM   Wesley Anderson 03-28-1946 [id number]  CC: Chief Complaint  Patient presents with   Hematuria   HPI: Wesley Anderson. is a 78 y.o. male with PMH neurogenic bladder, urge incontinence, recurrent UTI previously on suppressive antibiotics, BPH with bladder stones s/p cystolitholapaxy in February 2024 with residual stones on CT urogram in October 2024 managed conservatively, and gross hematuria who presents today for evaluation of possible UTI.  He is a pending today by his wife, Bonita Quin, who contributes to HPI.  Today Bonita Quin reports that he had some increased confusion, visual hallucinations, dehydration, and a fall about 10 days ago.  They went to the wellness clinic and he was given IV fluids.  UA was positive and he may have also had a positive urine culture.  Bactrim was called in, but she deferred filling it in favor of following up in our clinic.  Overall she states he has improved somewhat after fluid resuscitation alone.  In-office UA today positive for trace ketones, trace protein, and 1+ leukocytes; urine microscopy with 11-30 WBCs/HPF and moderate bacteria.   PMH: Past Medical History:  Diagnosis Date   Alcohol dependence in remission (HCC)    Anemia    Aortic atherosclerosis (HCC)    Aortic ectasia, thoracoabdominal (HCC)    a.) [place] retroperitoneal 05/31/2016: abd Ao measured 2.7 cm; b.) CT chest 11/16/2020: asc Ao measured 4.0 cm; c.)  CT chest 01/22/2021: asc Ao measured 4.0 cm; d.) CT chest 03/26/2021: asc Ao measured 4.0 cm   Bladder stones    BPH (benign prostatic hyperplasia)    Complication of anesthesia    a.) (+) postoperative delirium   Constipation    Coronary artery calcification seen on CT scan    Depression    ED (erectile dysfunction)    Fall at home    GERD (gastroesophageal reflux disease)    Hematest positive stools    HFrEF (heart failure with reduced ejection fraction) (HCC)    a.) TTE 04/13/2022: EF 40%, glob HK, mild  panvalvular regurg.   History of 2019 novel coronavirus disease (COVID-19) 04/22/2022   History of kidney stones    History of postoperative delirium 07/03/2020   a.) in the setting of GA for skin cancer removal; combativeness + acute worsening AMS + worsening memory/overall cognition persistent for several days following procedure   Incontinence of urine    Left inguinal hernia    Melanoma of skin (HCC)    Nephrolithiasis    Parkinson's disease (HCC)    a.) RIGHT STN-DBS in place   Parkinson's disease dementia (HCC)    a.) on donepezil   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) (HCC)    Renal cyst, right    Restless leg syndrome    Sepsis (HCC)    Sinus bradycardia    Sleep difficulties    a.) takes melatonin   Squamous cell carcinoma of scalp    a.) s/p flap excision at [facility] 07/03/2020 under general anesthesia; procedure complicated by (+) postoperative delirium   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   a.) s/p RIGHT STN-DBS placement   Tubular adenoma of colon    Umbilical hernia    UTI (urinary tract infection)    Vocal cord polyp    a.) s/p removal in 1973    Surgical History: Past Surgical History:  Procedure Laterality Date   CHOLECYSTECTOMY     COLONOSCOPY WITH PROPOFOL N/A 11/07/2016   Procedure: COLONOSCOPY WITH PROPOFOL;  Surgeon: Christena Deem, MD;  Location[facility] ENDOSCOPY;  Service: Endoscopy;  Laterality: N/A;   COLONOSCOPY WITH PROPOFOL N/A 09/06/2018   Procedure: COLONOSCOPY WITH PROPOFOL;  Surgeon: Christena Deem, MD;  Location: [facility] ENDOSCOPY;  Service: Endoscopy;  Laterality: N/A;   COLONOSCOPY WITH PROPOFOL N/A 05/16/2019   Procedure: COLONOSCOPY WITH PROPOFOL;  Surgeon: Christena Deem, MD;  Location: [facility] ENDOSCOPY;  Service: Endoscopy;  Laterality: N/A;   CYST EXCISION     vocal cord   CYSTOSCOPY WITH LITHOLAPAXY N/A 10/11/2022   Procedure: CYSTOSCOPY WITH LITHOLAPAXY;  Surgeon: Riki Altes, MD;   Location: [facility] ORS;  Service: Urology;  Laterality: N/A;   DEEP BRAIN STIMULATOR PLACEMENT Right 10/24/2013   ESOPHAGOGASTRODUODENOSCOPY (EGD) WITH PROPOFOL N/A 09/06/2018   Procedure: ESOPHAGOGASTRODUODENOSCOPY (EGD) WITH PROPOFOL;  Surgeon: Christena Deem, MD;  Location: [facility] ENDOSCOPY;  Service: Endoscopy;  Laterality: N/A;   LAPAROSCOPIC PARTIAL COLECTOMY     MELANOMA EXCISION     TONSILLECTOMY     age 25    Home Medications:  Allergies as of 10/09/2023       Reactions   Artane [trihexyphenidyl] Anxiety   Prochlorperazine Nausea And Vomiting   Propofol Other (See Comments)   Delirium Delirium Delirium Delirium   Reglan [metoclopramide]    To not be given pt has Parkinson's        Medication List        Accurate as of October 09, 2023  4:36 PM. If you have any questions, ask your nurse or doctor.          STOP taking these medications    sulfamethoxazole-trimethoprim 800-160 MG tablet Commonly known as: BACTRIM DS       TAKE these medications    carbidopa-levodopa 25-100 MG tablet Commonly known as: SINEMET IR Take 2 tablets by mouth 4 (four) times daily.   donepezil 10 MG tablet Commonly known as: ARICEPT Take 10 mg by mouth at bedtime.   Elderberry Zinc/Vit C/Immune Lozg Use as directed 1 lozenge in the mouth or throat daily.   Gemtesa 75 MG Tabs Generic drug: Vibegron Take 1 tablet (75 mg total) by mouth every evening.   melatonin 3 MG Tabs tablet Take 9 mg by mouth at bedtime.   multivitamin tablet Take 1 tablet by mouth daily.   pantoprazole 40 MG tablet Commonly known as: PROTONIX Take 40 mg by mouth 2 (two) times daily.   polyethylene glycol 17 g packet Commonly known as: MIRALAX / GLYCOLAX Take 17 g by mouth daily as needed for mild constipation or moderate constipation. What changed: when to take this   QUEtiapine 25 MG tablet Commonly known as: SEROQUEL Take 75 mg by mouth at bedtime.   sertraline 100 MG  tablet Commonly known as: ZOLOFT Take 100 mg by mouth every morning.   tamsulosin 0.4 MG Caps capsule Commonly known as: FLOMAX Take 0.4 mg by mouth daily after supper.        Allergies:  Allergies  Allergen Reactions   Artane [Trihexyphenidyl] Anxiety   Prochlorperazine Nausea And Vomiting   Propofol Other (See Comments)    Delirium Delirium Delirium Delirium    Reglan [Metoclopramide]     To not be given pt has Parkinson's    Family History: Family History  Problem Relation Age of Onset   Diabetes Father     Social History:   reports that he has quit smoking. His smoking use included cigarettes. He has never used smokeless tobacco. He reports that he  does not currently use alcohol after a past usage of about 6.0 standard drinks of alcohol per week. He reports that he does not use drugs.  Physical Exam: BP 105/66   Pulse 76   Ht 5\' 9"  (1.753 m)   Wt 178 lb (80.7 kg)   BMI 26.29 kg/m   Constitutional:  Alert and oriented, no acute distress, nontoxic appearing HEENT: [place], AT Cardiovascular: No clubbing, cyanosis, or edema Respiratory: Normal respiratory effort, no increased work of breathing Skin: No rashes, bruises or suspicious lesions Neurologic: Grossly intact, no focal deficits, moving all 4 extremities Psychiatric: Normal mood and affect  Laboratory Data: Results for orders placed or performed in visit on 10/09/23  Microscopic Examination   Collection Time: 10/09/23  4:13 PM   Urine  Result Value Ref Range   WBC, UA 11-30 (A) 0 - 5 /hpf   RBC, Urine 0-2 0 - 2 /hpf   Epithelial Cells (non renal) 0-10 0 - 10 /hpf   Casts Present (A) None seen /lpf   Cast Type Hyaline casts N/A   Mucus, UA Present (A) Not Estab.   Bacteria, UA Moderate (A) None seen/Few  Urinalysis, Complete   Collection Time: 10/09/23  4:13 PM  Result Value Ref Range   Specific Gravity, UA >1.030 (H) 1.005 - 1.030   pH, UA 6.0 5.0 - 7.5   Color, UA Yellow Yellow   Appearance Ur  Clear Clear   Leukocytes,UA 1+ (A) Negative   Protein,UA Trace (A) Negative/Trace   Glucose, UA Negative Negative   Ketones, UA Trace (A) Negative   RBC, UA Negative Negative   Bilirubin, UA Negative Negative   Urobilinogen, Ur 0.2 0.2 - 1.0 mg/dL   Nitrite, UA Negative Negative   Microscopic Examination See below:    Assessment & Plan:   1. Recurrent UTI (Primary) UA is suspicious today, will start empiric Macrobid and send for culture for further evaluation.  With his infrequency of infection, will defer resuming suppressive antibiotics for now but we discussed that we can revisit this in the future if his infections become more frequent. - Urinalysis, Complete - CULTURE, URINE COMPREHENSIVE - nitrofurantoin, macrocrystal-monohydrate, (MACROBID) 100 MG capsule; Take 1 capsule (100 mg total) by mouth 2 (two) times daily for 7 days.  Dispense: 14 capsule; Refill: 0   Return if symptoms worsen or fail to improve.  Carman Ching, PA-C  [facility]  [address] [place], [place] [address] [phone]

## 2023-10-12 LAB — CULTURE, URINE COMPREHENSIVE

## 2024-01-02 ENCOUNTER — Encounter (INDEPENDENT_AMBULATORY_CARE_PROVIDER_SITE_OTHER): Payer: Self-pay

## 2024-08-12 ENCOUNTER — Ambulatory Visit: Payer: Self-pay | Admitting: Urology

## 2024-08-12 VITALS — BP 96/59 | HR 71 | Ht 69.0 in | Wt 170.0 lb

## 2024-08-12 DIAGNOSIS — N3941 Urge incontinence: Secondary | ICD-10-CM

## 2024-08-12 DIAGNOSIS — N390 Urinary tract infection, site not specified: Secondary | ICD-10-CM

## 2024-08-12 MED ORDER — GEMTESA 75 MG PO TABS: 1.0000 | ORAL_TABLET | Freq: Every evening | ORAL | 3 refills | Status: AC

## 2024-08-12 NOTE — Progress Notes (Signed)
 "  08/12/2024 1:15 PM   Wesley Anderson 10/02/1945 [id number]  Referring provider: Fernande Ophelia JINNY DOUGLAS, MD [address] [facility] [place],  [place] [address]  No chief complaint on file.   HPI: Patient has high-volume urge incontinence and high-volume bedwetting.  He actually does not get up to urinate and uses pads.  He can wear 6 pads a day moderately wet or soaked.  Last visit he was given Myrbetriq .  Gemtesa  had initially worked and then stopped working.  He had been on Flomax    He voids every 1-2 hours and has no nocturia.   He has had Parkinson's for many years.  His wife thinks he gets multiple bladder infections with dysuria and just finished an antibiotic on Friday.  The last 2 cultures have been negative but he has had positive cultures.   He tends toward significant constipation   Patient has urge incontinence and neurogenic bladder.  I am not convinced that urodynamics would change the management.  He had red blood cells in the urine today but it looked infected.  Recognizing potential limitations I decided to give him ciprofloxacin  500 mg twice a day for 7 days and put him on trimethoprim  100 mg daily 30x11.  Have him come back and see if he down regulate in about 6 weeks for cystoscopy.  Antimuscarinics may not be ideal due to Parkinson's.  Not a good candidate for InterStim or Botox but good candidate for percutaneous tibial nerve stimulation.     The Gemtesa  was working for short while and perhaps he failed because of infections.  Patient had a normal renal ultrasound in October 2020   Last culture positive sensitive to ciprofloxacin  and Macrodantin .  He still has some burning.  Urine looked positive.  He still leaking a lot on Gemtesa  and on trimethoprim    Cystoscopy: Patient underwent flexible cystoscopy.  Penile bulbar urethra normal.  He had bilobar enlargement of the prostate.  He had a little bit of elevated bladder neck.  Urine was very cloudy with  white flecks and sediment.  It was difficult to see the urothelium throughout the bladder but it looked erythematous.  He appeared to have at least a 2 cm stone in the midline and visibility was modest at best     [facility] on last culture I will call in ciprofloxacin  250 mg twice a day for 7 days and put him on daily Macrodantin  100 mg. I will get CT stone protocol and he will follow-up with Dr. Twylla and likely will need a transurethral removal of bladder stone. If we can sterilize his urine he may do well with the medication such as Gemtesa . He will need bladder stone removed to sterilize his urine. He will need a postvoid residual urine volume in the future. His residual May 25, 2022 was 106 mL. I think is very reasonable to go back and try Gemtesa  after he sterilized    Today Patient had transurethral removal of bladder stone on October 11, 2022.  It was approximately 3 cm in size also seen on CT scan.  He reported he was doing better in terms of his urgency on Gemtesa  when he saw Dr. GORMAN postoperatively.  No infections and is not on trimethoprim .  He has almost complete control and is very pleased flow stable     Renew Gemtesa . If he starts getting bladder infections put him back on trimethoprim . Otherwise see in 6 months and check another residual.  I  last saw the patient June 2024.   Today I last saw the patient 6 months ago.  I think there may have been some confusion that I had formed cystoscopy in June but had not.  The patient went on to see Dr. Twylla and had a CT scan.  There were 2 small residual stones in the bladder.  One of them was 6 mm in size in the posterior right lateral bladder diverticulum.  He had a 2 mm stone in the posterior left lateral bladder diverticulum.  Urine culture normal October 2024 when seen by Dr. Twylla.  He also has a 7 mm nonobstructing stone lower pole right kidney and a Bosniak 1 cyst.   Being well from a bladder control standpoint.  He saw a little bit of  spotting off and on for a few days without any pain.  Urine reviewed and sent for culture.     From a hematuria standpoint patient has been cleared as he is just had a CT scan and to cystoscopic procedures 1 in the office 1 in the operating room.  I described the small stones he has in kidney and bladder.  Watchful waiting for them.   Reassurance given.  Call if culture positive.  Gemtesa  renewed and see in 1 year with postvoid residual.  Postvoid residual today was 88 mL.  He may need radiographic follow-up for enlarging stones over time.  Dec 29/2025 Urge incontinence stable.  Frequency stable.  No infections.   PMH: Past Medical History:  Diagnosis Date   Alcohol dependence in remission (HCC)    Anemia    Aortic atherosclerosis    Aortic ectasia, thoracoabdominal    a.) US  retroperitoneal 05/31/2016: abd Ao measured 2.7 cm; b.) CT chest 11/16/2020: asc Ao measured 4.0 cm; c.)  CT chest 01/22/2021: asc Ao measured 4.0 cm; d.) CT chest 03/26/2021: asc Ao measured 4.0 cm   Bladder stones    BPH (benign prostatic hyperplasia)    Complication of anesthesia    a.) (+) postoperative delirium   Constipation    Coronary artery calcification seen on CT scan    Depression    ED (erectile dysfunction)    Fall at home    GERD (gastroesophageal reflux disease)    Hematest positive stools    HFrEF (heart failure with reduced ejection fraction) (HCC)    a.) TTE 04/13/2022: EF 40%, glob HK, mild panvalvular regurg.   History of 2019 novel coronavirus disease (COVID-19) 04/22/2022   History of kidney stones    History of postoperative delirium 07/03/2020   a.) in the setting of GA for skin cancer removal; combativeness + acute worsening AMS + worsening memory/overall cognition persistent for several days following procedure   Incontinence of urine    Left inguinal hernia    Melanoma of skin (HCC)    Nephrolithiasis    Parkinson's disease (HCC)    a.) RIGHT STN-DBS in place   Parkinson's  disease dementia (HCC)    a.) on donepezil    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) (HCC)    Renal cyst, right    Restless leg syndrome    Sepsis (HCC)    Sinus bradycardia    Sleep difficulties    a.) takes melatonin   Squamous cell carcinoma of scalp    a.) s/p flap excision at [facility] 07/03/2020 under general anesthesia; procedure complicated by (+) postoperative delirium   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   a.) s/p RIGHT STN-DBS placement  Tubular adenoma of colon    Umbilical hernia    UTI (urinary tract infection)    Vocal cord polyp    a.) s/p removal in 1973    Surgical History: Past Surgical History:  Procedure Laterality Date   CHOLECYSTECTOMY     COLONOSCOPY WITH PROPOFOL  N/A 11/07/2016   Procedure: COLONOSCOPY WITH PROPOFOL ;  Surgeon: Gladis RAYMOND Mariner, MD;  Location: [facility] ENDOSCOPY;  Service: Endoscopy;  Laterality: N/A;   COLONOSCOPY WITH PROPOFOL  N/A 09/06/2018   Procedure: COLONOSCOPY WITH PROPOFOL ;  Surgeon: Mariner Gladis RAYMOND, MD;  Location: [facility] ENDOSCOPY;  Service: Endoscopy;  Laterality: N/A;   COLONOSCOPY WITH PROPOFOL  N/A 05/16/2019   Procedure: COLONOSCOPY WITH PROPOFOL ;  Surgeon: Mariner Gladis RAYMOND, MD;  Location: [facility] ENDOSCOPY;  Service: Endoscopy;  Laterality: N/A;   CYST EXCISION     vocal cord   CYSTOSCOPY WITH LITHOLAPAXY N/A 10/11/2022   Procedure: CYSTOSCOPY WITH LITHOLAPAXY;  Surgeon: Twylla Glendia BROCKS, MD;  Location: [facility] ORS;  Service: Urology;  Laterality: N/A;   DEEP BRAIN STIMULATOR PLACEMENT Right 10/24/2013   ESOPHAGOGASTRODUODENOSCOPY (EGD) WITH PROPOFOL  N/A 09/06/2018   Procedure: ESOPHAGOGASTRODUODENOSCOPY (EGD) WITH PROPOFOL ;  Surgeon: Mariner Gladis RAYMOND, MD;  Location: [facility] ENDOSCOPY;  Service: Endoscopy;  Laterality: N/A;   LAPAROSCOPIC PARTIAL COLECTOMY     MELANOMA EXCISION     TONSILLECTOMY     age 78    Home Medications:  Allergies as of 08/12/2024       Reactions   Artane  [trihexyphenidyl] Anxiety   Prochlorperazine Nausea And Vomiting   Propofol  Other (See Comments)   Delirium Delirium Delirium Delirium   Reglan [metoclopramide]    To not be given pt has Parkinson's        Medication List        Accurate as of August 12, 2024  1:15 PM. If you have any questions, ask your nurse or doctor.          carbidopa -levodopa  25-100 MG tablet Commonly known as: SINEMET  IR Take 2 tablets by mouth 4 (four) times daily. The timing of this medication is very important.   donepezil  10 MG tablet Commonly known as: ARICEPT  Take 10 mg by mouth at bedtime.   Elderberry Zinc /Vit C/Immune Lozg Use as directed 1 lozenge in the mouth or throat daily.   Gemtesa  75 MG Tabs Generic drug: Vibegron  Take 1 tablet (75 mg total) by mouth every evening.   melatonin 3 MG Tabs tablet Take 9 mg by mouth at bedtime.   multivitamin tablet Take 1 tablet by mouth daily.   pantoprazole  40 MG tablet Commonly known as: PROTONIX  Take 40 mg by mouth 2 (two) times daily.   polyethylene glycol 17 g packet Commonly known as: MIRALAX  / GLYCOLAX  Take 17 g by mouth daily as needed for mild constipation or moderate constipation. What changed: when to take this   QUEtiapine  25 MG tablet Commonly known as: SEROQUEL  Take 75 mg by mouth at bedtime.   sertraline  100 MG tablet Commonly known as: ZOLOFT  Take 100 mg by mouth every morning.   tamsulosin  0.4 MG Caps capsule Commonly known as: FLOMAX  Take 0.4 mg by mouth daily after supper.        Allergies: Allergies[1]  Family History: Family History  Problem Relation Age of Onset   Diabetes Father     Social History:  reports that he has quit smoking. His smoking use included cigarettes. He has never used smokeless tobacco. He reports that he does not currently use alcohol after a  past usage of about 6.0 standard drinks of alcohol per week. He reports that he does not use drugs.  ROS:                                         Physical Exam: There were no vitals taken for this visit.  Constitutional:  Alert and oriented, No acute distress. HEENT:  AT, moist mucus membranes.  Trachea midline, no masses.   Laboratory Data: Lab Results  Component Value Date   WBC 9.8 05/02/2022   HGB 13.0 (A) 05/02/2022   HCT 38 (A) 05/02/2022   MCV 92.1 04/25/2022   PLT 266 05/02/2022    Lab Results  Component Value Date   CREATININE 1.2 05/02/2022    No results found for: PSA  No results found for: TESTOSTERONE  No results found for: HGBA1C  Urinalysis    Component Value Date/Time   COLORURINE STRAW (A) 04/22/2022 0805   APPEARANCEUR Clear 10/09/2023 1613   LABSPEC 1.004 (L) 04/22/2022 0805   PHURINE 6.0 04/22/2022 0805   GLUCOSEU Negative 10/09/2023 1613   HGBUR NEGATIVE 04/22/2022 0805   BILIRUBINUR Negative 10/09/2023 1613   KETONESUR NEGATIVE 04/22/2022 0805   PROTEINUR Trace (A) 10/09/2023 1613   PROTEINUR NEGATIVE 04/22/2022 0805   NITRITE Negative 10/09/2023 1613   NITRITE NEGATIVE 04/22/2022 0805   LEUKOCYTESUR 1+ (A) 10/09/2023 1613   LEUKOCYTESUR NEGATIVE 04/22/2022 0805    Pertinent Imaging:   Assessment & Plan: Gemtesa  90 x 3 sent to pharmacy and I will see in 1 year.  Parkinson's stable  1. Recurrent UTI (Primary)  - Urinalysis, Complete   No follow-ups on file.  Glendia DELENA Elizabeth, MD  [facility] [address], [address] [place], [place] [address] [phone]     [1]  Allergies Allergen Reactions   Artane [Trihexyphenidyl] Anxiety   Prochlorperazine Nausea And Vomiting   Propofol  Other (See Comments)    Delirium Delirium Delirium Delirium    Reglan [Metoclopramide]     To not be given pt has Parkinson's   "

## 2025-08-11 ENCOUNTER — Ambulatory Visit: Admitting: Urology
